# Patient Record
Sex: Female | Born: 1945 | Race: White | Hispanic: No | Marital: Married | Smoking: Never smoker
Health system: Southern US, Community
[De-identification: ages and names within clinical notes are randomized; demographics above are authoritative.]

## PROBLEM LIST (undated history)

## (undated) DIAGNOSIS — G43909 Migraine, unspecified, not intractable, without status migrainosus: Secondary | ICD-10-CM

## (undated) DIAGNOSIS — D649 Anemia, unspecified: Secondary | ICD-10-CM

## (undated) DIAGNOSIS — N6019 Diffuse cystic mastopathy of unspecified breast: Secondary | ICD-10-CM

## (undated) DIAGNOSIS — F32A Depression, unspecified: Secondary | ICD-10-CM

## (undated) DIAGNOSIS — C210 Malignant neoplasm of anus, unspecified: Secondary | ICD-10-CM

## (undated) DIAGNOSIS — K649 Unspecified hemorrhoids: Secondary | ICD-10-CM

## (undated) DIAGNOSIS — M5430 Sciatica, unspecified side: Secondary | ICD-10-CM

## (undated) DIAGNOSIS — R82998 Other abnormal findings in urine: Secondary | ICD-10-CM

## (undated) DIAGNOSIS — R682 Dry mouth, unspecified: Secondary | ICD-10-CM

## (undated) DIAGNOSIS — E039 Hypothyroidism, unspecified: Secondary | ICD-10-CM

## (undated) DIAGNOSIS — Z923 Personal history of irradiation: Secondary | ICD-10-CM

## (undated) DIAGNOSIS — Z9221 Personal history of antineoplastic chemotherapy: Secondary | ICD-10-CM

## (undated) DIAGNOSIS — D219 Benign neoplasm of connective and other soft tissue, unspecified: Secondary | ICD-10-CM

## (undated) DIAGNOSIS — M797 Fibromyalgia: Secondary | ICD-10-CM

## (undated) DIAGNOSIS — N762 Acute vulvitis: Secondary | ICD-10-CM

## (undated) DIAGNOSIS — D126 Benign neoplasm of colon, unspecified: Secondary | ICD-10-CM

## (undated) DIAGNOSIS — F329 Major depressive disorder, single episode, unspecified: Secondary | ICD-10-CM

## (undated) HISTORY — DX: Benign neoplasm of connective and other soft tissue, unspecified: D21.9

## (undated) HISTORY — DX: Unspecified hemorrhoids: K64.9

## (undated) HISTORY — DX: Diffuse cystic mastopathy of unspecified breast: N60.19

## (undated) HISTORY — DX: Anemia, unspecified: D64.9

## (undated) HISTORY — DX: Malignant neoplasm of anus, unspecified: C21.0

## (undated) HISTORY — DX: Sciatica, unspecified side: M54.30

## (undated) HISTORY — PX: COLONOSCOPY: SHX174

## (undated) HISTORY — DX: Dry mouth, unspecified: R68.2

## (undated) HISTORY — PX: COSMETIC SURGERY: SHX468

## (undated) HISTORY — DX: Benign neoplasm of colon, unspecified: D12.6

## (undated) HISTORY — DX: Migraine, unspecified, not intractable, without status migrainosus: G43.909

## (undated) HISTORY — PX: BREAST BIOPSY: SHX20

## (undated) HISTORY — DX: Fibromyalgia: M79.7

## (undated) HISTORY — DX: Acute vulvitis: N76.2

## (undated) HISTORY — PX: CATARACT EXTRACTION: SUR2

## (undated) HISTORY — DX: Other abnormal findings in urine: R82.998

---

## 1984-12-07 HISTORY — PX: VAGINAL HYSTERECTOMY: SUR661

## 1996-12-07 HISTORY — PX: REDUCTION MAMMAPLASTY: SUR839

## 2003-01-12 DIAGNOSIS — C210 Malignant neoplasm of anus, unspecified: Secondary | ICD-10-CM

## 2003-01-12 HISTORY — PX: OTHER SURGICAL HISTORY: SHX169

## 2003-01-12 HISTORY — DX: Malignant neoplasm of anus, unspecified: C21.0

## 2003-12-08 HISTORY — PX: BREAST SURGERY: SHX581

## 2003-12-08 HISTORY — PX: TONSILLECTOMY: SUR1361

## 2004-09-06 ENCOUNTER — Ambulatory Visit: Payer: Self-pay | Admitting: Oncology

## 2004-11-21 ENCOUNTER — Ambulatory Visit: Payer: Self-pay | Admitting: Oncology

## 2004-11-25 ENCOUNTER — Ambulatory Visit: Payer: Self-pay | Admitting: Oncology

## 2004-12-07 ENCOUNTER — Ambulatory Visit: Payer: Self-pay | Admitting: Oncology

## 2004-12-07 DIAGNOSIS — G43909 Migraine, unspecified, not intractable, without status migrainosus: Secondary | ICD-10-CM

## 2004-12-07 DIAGNOSIS — K649 Unspecified hemorrhoids: Secondary | ICD-10-CM

## 2004-12-07 DIAGNOSIS — M797 Fibromyalgia: Secondary | ICD-10-CM

## 2004-12-07 HISTORY — DX: Fibromyalgia: M79.7

## 2004-12-07 HISTORY — DX: Migraine, unspecified, not intractable, without status migrainosus: G43.909

## 2004-12-07 HISTORY — DX: Unspecified hemorrhoids: K64.9

## 2005-03-24 ENCOUNTER — Ambulatory Visit: Payer: Self-pay | Admitting: Oncology

## 2005-04-06 ENCOUNTER — Ambulatory Visit: Payer: Self-pay | Admitting: Oncology

## 2005-07-27 ENCOUNTER — Ambulatory Visit: Payer: Self-pay | Admitting: Oncology

## 2005-08-07 ENCOUNTER — Ambulatory Visit: Payer: Self-pay | Admitting: Oncology

## 2005-09-06 ENCOUNTER — Ambulatory Visit: Payer: Self-pay | Admitting: Oncology

## 2005-10-01 ENCOUNTER — Ambulatory Visit: Payer: Self-pay | Admitting: General Surgery

## 2005-10-07 ENCOUNTER — Ambulatory Visit: Payer: Self-pay | Admitting: Oncology

## 2005-12-15 ENCOUNTER — Ambulatory Visit: Payer: Self-pay | Admitting: Oncology

## 2005-12-22 ENCOUNTER — Ambulatory Visit: Payer: Self-pay | Admitting: General Surgery

## 2006-01-07 ENCOUNTER — Ambulatory Visit: Payer: Self-pay | Admitting: Oncology

## 2006-02-16 ENCOUNTER — Ambulatory Visit: Payer: Self-pay | Admitting: Rheumatology

## 2006-04-14 ENCOUNTER — Ambulatory Visit: Payer: Self-pay | Admitting: Oncology

## 2006-05-07 ENCOUNTER — Ambulatory Visit: Payer: Self-pay | Admitting: Oncology

## 2006-06-14 ENCOUNTER — Ambulatory Visit: Payer: Self-pay | Admitting: Oncology

## 2006-07-07 ENCOUNTER — Ambulatory Visit: Payer: Self-pay | Admitting: Oncology

## 2006-09-30 ENCOUNTER — Ambulatory Visit: Payer: Self-pay | Admitting: Oncology

## 2006-10-07 ENCOUNTER — Ambulatory Visit: Payer: Self-pay | Admitting: Oncology

## 2006-10-07 ENCOUNTER — Ambulatory Visit: Payer: Self-pay | Admitting: General Surgery

## 2006-11-18 ENCOUNTER — Ambulatory Visit: Payer: Self-pay | Admitting: Rheumatology

## 2007-02-16 ENCOUNTER — Ambulatory Visit: Payer: Self-pay | Admitting: Oncology

## 2007-03-08 ENCOUNTER — Ambulatory Visit: Payer: Self-pay | Admitting: Oncology

## 2007-08-02 ENCOUNTER — Other Ambulatory Visit: Admission: RE | Admit: 2007-08-02 | Discharge: 2007-08-02 | Payer: Self-pay | Admitting: Obstetrics and Gynecology

## 2007-09-19 ENCOUNTER — Ambulatory Visit: Payer: Self-pay | Admitting: General Surgery

## 2007-10-08 ENCOUNTER — Ambulatory Visit: Payer: Self-pay | Admitting: Oncology

## 2007-10-11 ENCOUNTER — Ambulatory Visit: Payer: Self-pay | Admitting: General Surgery

## 2007-10-17 ENCOUNTER — Ambulatory Visit: Payer: Self-pay | Admitting: Oncology

## 2007-11-07 ENCOUNTER — Ambulatory Visit: Payer: Self-pay | Admitting: Oncology

## 2007-11-22 ENCOUNTER — Encounter (INDEPENDENT_AMBULATORY_CARE_PROVIDER_SITE_OTHER): Payer: Self-pay | Admitting: Family Medicine

## 2007-11-22 ENCOUNTER — Ambulatory Visit: Payer: Self-pay | Admitting: Sports Medicine

## 2007-11-22 DIAGNOSIS — M722 Plantar fascial fibromatosis: Secondary | ICD-10-CM | POA: Insufficient documentation

## 2007-11-22 DIAGNOSIS — Z85038 Personal history of other malignant neoplasm of large intestine: Secondary | ICD-10-CM | POA: Insufficient documentation

## 2007-11-22 DIAGNOSIS — M1990 Unspecified osteoarthritis, unspecified site: Secondary | ICD-10-CM

## 2007-11-22 DIAGNOSIS — E785 Hyperlipidemia, unspecified: Secondary | ICD-10-CM | POA: Insufficient documentation

## 2007-12-20 ENCOUNTER — Ambulatory Visit: Payer: Self-pay | Admitting: Sports Medicine

## 2008-05-07 ENCOUNTER — Ambulatory Visit: Payer: Self-pay | Admitting: Oncology

## 2008-06-06 ENCOUNTER — Ambulatory Visit: Payer: Self-pay | Admitting: Oncology

## 2008-06-14 ENCOUNTER — Ambulatory Visit: Payer: Self-pay | Admitting: Oncology

## 2008-07-02 LAB — HM PAP SMEAR

## 2008-07-07 ENCOUNTER — Ambulatory Visit: Payer: Self-pay | Admitting: Oncology

## 2008-11-07 ENCOUNTER — Ambulatory Visit: Payer: Self-pay | Admitting: General Surgery

## 2009-01-07 ENCOUNTER — Ambulatory Visit: Payer: Self-pay | Admitting: Oncology

## 2009-01-09 ENCOUNTER — Ambulatory Visit: Payer: Self-pay | Admitting: Oncology

## 2009-02-04 ENCOUNTER — Ambulatory Visit: Payer: Self-pay | Admitting: Oncology

## 2009-03-07 ENCOUNTER — Ambulatory Visit: Payer: Self-pay | Admitting: Oncology

## 2009-03-11 ENCOUNTER — Ambulatory Visit: Payer: Self-pay | Admitting: Oncology

## 2009-04-06 ENCOUNTER — Ambulatory Visit: Payer: Self-pay | Admitting: Oncology

## 2009-06-06 ENCOUNTER — Ambulatory Visit: Payer: Self-pay | Admitting: Oncology

## 2009-06-17 ENCOUNTER — Ambulatory Visit: Payer: Self-pay | Admitting: Oncology

## 2009-07-07 ENCOUNTER — Ambulatory Visit: Payer: Self-pay | Admitting: Oncology

## 2009-10-01 ENCOUNTER — Ambulatory Visit: Payer: Self-pay | Admitting: Sports Medicine

## 2009-10-01 DIAGNOSIS — S93409A Sprain of unspecified ligament of unspecified ankle, initial encounter: Secondary | ICD-10-CM | POA: Insufficient documentation

## 2009-10-01 DIAGNOSIS — M25579 Pain in unspecified ankle and joints of unspecified foot: Secondary | ICD-10-CM

## 2009-10-01 DIAGNOSIS — M216X9 Other acquired deformities of unspecified foot: Secondary | ICD-10-CM

## 2009-11-05 ENCOUNTER — Ambulatory Visit: Payer: Self-pay

## 2009-11-13 ENCOUNTER — Ambulatory Visit: Payer: Self-pay

## 2010-06-06 ENCOUNTER — Ambulatory Visit: Payer: Self-pay | Admitting: Oncology

## 2010-06-12 ENCOUNTER — Ambulatory Visit: Payer: Self-pay | Admitting: Oncology

## 2010-07-07 ENCOUNTER — Ambulatory Visit: Payer: Self-pay | Admitting: Oncology

## 2010-10-20 ENCOUNTER — Ambulatory Visit: Payer: Self-pay | Admitting: General Surgery

## 2011-06-15 ENCOUNTER — Ambulatory Visit: Payer: Self-pay | Admitting: Oncology

## 2011-07-08 ENCOUNTER — Ambulatory Visit: Payer: Self-pay | Admitting: Oncology

## 2011-11-25 ENCOUNTER — Ambulatory Visit: Payer: Self-pay | Admitting: General Surgery

## 2011-12-18 ENCOUNTER — Ambulatory Visit: Payer: Self-pay | Admitting: General Surgery

## 2011-12-21 LAB — PATHOLOGY REPORT

## 2012-06-14 ENCOUNTER — Ambulatory Visit: Payer: Self-pay | Admitting: Oncology

## 2012-06-14 LAB — COMPREHENSIVE METABOLIC PANEL
Albumin: 3.5 g/dL (ref 3.4–5.0)
BUN: 18 mg/dL (ref 7–18)
Bilirubin,Total: 0.3 mg/dL (ref 0.2–1.0)
Chloride: 105 mmol/L (ref 98–107)
Creatinine: 0.84 mg/dL (ref 0.60–1.30)
Glucose: 111 mg/dL — ABNORMAL HIGH (ref 65–99)
Osmolality: 280 (ref 275–301)
Potassium: 3.9 mmol/L (ref 3.5–5.1)
SGOT(AST): 21 U/L (ref 15–37)
Sodium: 139 mmol/L (ref 136–145)
Total Protein: 6.7 g/dL (ref 6.4–8.2)

## 2012-06-14 LAB — CBC CANCER CENTER
Eosinophil #: 0.1 x10 3/mm (ref 0.0–0.7)
HCT: 37.4 % (ref 35.0–47.0)
Lymphocyte %: 27 %
MCHC: 33.4 g/dL (ref 32.0–36.0)
Monocyte %: 9.7 %
Neutrophil #: 2.4 x10 3/mm (ref 1.4–6.5)
Neutrophil %: 59.4 %
RDW: 13.2 % (ref 11.5–14.5)

## 2012-07-07 ENCOUNTER — Ambulatory Visit: Payer: Self-pay | Admitting: Oncology

## 2012-10-07 LAB — HM COLONOSCOPY: HM Colonoscopy: 1

## 2012-12-12 ENCOUNTER — Ambulatory Visit: Payer: Self-pay | Admitting: General Surgery

## 2012-12-30 DIAGNOSIS — N6019 Diffuse cystic mastopathy of unspecified breast: Secondary | ICD-10-CM

## 2012-12-30 HISTORY — DX: Diffuse cystic mastopathy of unspecified breast: N60.19

## 2013-02-06 ENCOUNTER — Ambulatory Visit: Payer: Self-pay | Admitting: General Surgery

## 2013-02-10 ENCOUNTER — Ambulatory Visit: Payer: Self-pay | Admitting: General Surgery

## 2013-02-13 LAB — PATHOLOGY REPORT

## 2013-02-20 DIAGNOSIS — R82998 Other abnormal findings in urine: Secondary | ICD-10-CM

## 2013-02-20 HISTORY — DX: Other abnormal findings in urine: R82.998

## 2013-03-23 ENCOUNTER — Encounter: Payer: Self-pay | Admitting: *Deleted

## 2013-03-23 DIAGNOSIS — G43909 Migraine, unspecified, not intractable, without status migrainosus: Secondary | ICD-10-CM | POA: Insufficient documentation

## 2013-03-23 DIAGNOSIS — M797 Fibromyalgia: Secondary | ICD-10-CM | POA: Insufficient documentation

## 2013-03-23 DIAGNOSIS — R82998 Other abnormal findings in urine: Secondary | ICD-10-CM | POA: Insufficient documentation

## 2013-03-23 DIAGNOSIS — N6019 Diffuse cystic mastopathy of unspecified breast: Secondary | ICD-10-CM | POA: Insufficient documentation

## 2013-03-28 ENCOUNTER — Ambulatory Visit (INDEPENDENT_AMBULATORY_CARE_PROVIDER_SITE_OTHER): Payer: Medicare Other | Admitting: General Surgery

## 2013-03-28 ENCOUNTER — Encounter: Payer: Self-pay | Admitting: General Surgery

## 2013-03-28 VITALS — BP 124/90 | HR 62 | Resp 12 | Ht 64.0 in | Wt 183.0 lb

## 2013-03-28 DIAGNOSIS — Z85048 Personal history of other malignant neoplasm of rectum, rectosigmoid junction, and anus: Secondary | ICD-10-CM

## 2013-03-28 DIAGNOSIS — Z86004 Personal history of in-situ neoplasm of other and unspecified digestive organs: Secondary | ICD-10-CM

## 2013-03-28 NOTE — Progress Notes (Signed)
Patient ID: Melissa [name], female   DOB: [birth year], 67 y.o.   MRN: [id number]  Chief Complaint  Patient presents with  . Routine Post Op    EXCISION PERANAL    HPI GENNETT Melissa is a 67 y.o. female here today for an 6 weeks post op  excision peranal ulcer .Only bleed with bowel movement. 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 HPI  Past Medical History  Diagnosis Date  . Fibromyalgia 2006  . Hemorrhoids 2006  . Diffuse cystic mastopathy 12/30/12  . Migraines 2006  . Cancer 01/16/2004    anorectum  . Carcinoma in situ of anal canal [id number]  . Other nonspecific finding on examination of urine [id number]    Past Surgical History  Procedure Laterality Date  . Abdominal hysterectomy  1986  . Colonoscopy  2008, 2013  . Tonsillectomy  2005  . Breast surgery  2005    BREAST REDUCTION SX  . Anal mass exc  2014  . Cosmetic surgery  20008    FACIAL    History reviewed. No pertinent family history.  Social History History  Substance Use Topics  . Smoking status: Never Smoker   . Smokeless tobacco: Never Used  . Alcohol Use: No    Allergies  Allergen Reactions  . Pentazocine Lactate   . Propoxyphene Hcl   . Propoxyphene-Acetaminophen   . Tramadol Hcl   . Ciprofloxacin Hcl Rash  . Dicloxacillin Sodium Rash  . Iodine Rash    Current Outpatient Prescriptions  Medication Sig Dispense Refill  . diphenhydrAMINE (BENADRYL) 25 MG tablet Take 25 mg by mouth every 6 (six) hours as needed for itching.      . levothyroxine (SYNTHROID, LEVOTHROID) 125 MCG tablet Take 125 mcg by mouth daily before breakfast.      . loperamide (IMODIUM) 2 MG capsule Take 2 mg by mouth 4 (four) times daily as needed for diarrhea or loose stools.      . mirtazapine (REMERON) 30 MG tablet Take 30 mg by mouth at bedtime.      . nabumetone (RELAFEN) 500 MG tablet Take 500 mg by mouth daily.      . sertraline (ZOLOFT) 100 MG tablet Take 100 mg by mouth daily.      Marland Kitchen  venlafaxine (EFFEXOR) 75 MG tablet Take 75 mg by mouth 2 (two) times daily.       No current facility-administered medications for this visit.    Review of Systems Review of Systems  Constitutional: Negative.   Respiratory: Negative.   Cardiovascular: Negative.     Blood pressure 124/90, pulse 62, resp. rate 12, height 5\' 4"  (1.626 m), weight 183 lb (83.008 kg).  Physical Exam Physical Exam  Genitourinary:       Data Reviewed none  Assessment    Benign perianal ulcer, healing sow but is making progress.     Plan    Recheck in 6 weeks        SANKAR,SEEPLAPUTHUR G 03/28/2013, 6:35 PM

## 2013-03-28 NOTE — Patient Instructions (Addendum)
Return in six weeks

## 2013-05-09 ENCOUNTER — Encounter: Payer: Self-pay | Admitting: General Surgery

## 2013-05-09 ENCOUNTER — Ambulatory Visit (INDEPENDENT_AMBULATORY_CARE_PROVIDER_SITE_OTHER): Payer: Medicare Other | Admitting: General Surgery

## 2013-05-09 VITALS — BP 152/80 | HR 80 | Resp 12 | Ht 63.0 in | Wt 181.0 lb

## 2013-05-09 DIAGNOSIS — Z85048 Personal history of other malignant neoplasm of rectum, rectosigmoid junction, and anus: Secondary | ICD-10-CM

## 2013-05-09 DIAGNOSIS — Z86004 Personal history of in-situ neoplasm of other and unspecified digestive organs: Secondary | ICD-10-CM

## 2013-05-09 NOTE — Patient Instructions (Addendum)
Call for any worsening abdominal pain or rectal symptoms. Follow up as scheduled for Mammogram.

## 2013-05-09 NOTE — Progress Notes (Signed)
Patient ID: Melissa [name], female   DOB: [birth year], 67 y.o.   MRN: [id number]  Chief Complaint  Patient presents with  . Follow-up    HPI Melissa [name] is a 67 y.o. female who presents for a 6 week follow up s/p EAU and excision of perianal ulcer. The procedure was performed on 02/10/13. The patient has a history of anal SCC in-situ. She is doing well but did have some rectal bleeding in the last month. She complains of a 1 month history of mild right and left lower abdominal quadrant pain.  HPI  Past Medical History  Diagnosis Date  . Fibromyalgia 2006  . Hemorrhoids 2006  . Diffuse cystic mastopathy 12/30/12  . Migraines 2006  . Cancer 01/16/2004    anorectum  . Carcinoma in situ of anal canal [id number]  . Other nonspecific finding on examination of urine [id number]    Past Surgical History  Procedure Laterality Date  . Abdominal hysterectomy  1986  . Colonoscopy  2008, 2013  . Tonsillectomy  2005  . Breast surgery  2005    BREAST REDUCTION SX  . Anal mass exc  2014  . Cosmetic surgery  20008    FACIAL    History reviewed. No pertinent family history.  Social History History  Substance Use Topics  . Smoking status: Never Smoker   . Smokeless tobacco: Never Used  . Alcohol Use: No    Allergies  Allergen Reactions  . Pentazocine Lactate   . Propoxyphene Hcl   . Propoxyphene-Acetaminophen   . Tramadol Hcl   . Ciprofloxacin Hcl Rash  . Dicloxacillin Sodium Rash  . Iodine Rash    Current Outpatient Prescriptions  Medication Sig Dispense Refill  . diphenhydrAMINE (BENADRYL) 25 MG tablet Take 25 mg by mouth every 6 (six) hours as needed for itching.      . levothyroxine (SYNTHROID, LEVOTHROID) 125 MCG tablet Take 125 mcg by mouth daily before breakfast.      . loperamide (IMODIUM) 2 MG capsule Take 2 mg by mouth 4 (four) times daily as needed for diarrhea or loose stools.      . nabumetone (RELAFEN) 500 MG tablet Take 500 mg by mouth daily.      . sertraline  (ZOLOFT) 100 MG tablet Take 100 mg by mouth 2 (two) times daily.        No current facility-administered medications for this visit.    Review of Systems Review of Systems  Constitutional: Negative.   Cardiovascular: Negative.   Gastrointestinal: Positive for anal bleeding.    Blood pressure 152/80, pulse 80, resp. rate 12, height 5\' 3"  (1.6 m), weight 181 lb (82.101 kg).  Physical Exam Physical Exam  Constitutional: She appears well-developed and well-nourished.  Eyes: Conjunctivae are normal. No scleral icterus.  Neck: Trachea normal. No mass and no thyromegaly present.  Abdominal: Soft. Normal appearance and bowel sounds are normal. There is no hepatosplenomegaly. There is tenderness in the right lower quadrant and left lower quadrant. No hernia.  Genitourinary:       Data Reviewed None  Assessment    Healed perianal ulcer. New complaint of mild abdominal discomfort. No findings on exam.    Plan    Call for any worsening symptoms abdominal or rectal. Routine follow up January 2015.       Makaria Poarch G 05/09/2013, 11:03 AM

## 2013-06-06 ENCOUNTER — Ambulatory Visit: Payer: Self-pay | Admitting: Oncology

## 2013-06-28 LAB — CBC CANCER CENTER
Basophil #: 0 x10 3/mm (ref 0.0–0.1)
Lymphocyte %: 35.6 %
MCH: 33.1 pg (ref 26.0–34.0)
MCHC: 34.5 g/dL (ref 32.0–36.0)
MCV: 96 fL (ref 80–100)
Monocyte #: 0.4 x10 3/mm (ref 0.2–0.9)
Monocyte %: 9.7 %
Neutrophil #: 2.3 x10 3/mm (ref 1.4–6.5)
Platelet: 235 x10 3/mm (ref 150–440)
RBC: 4.04 10*6/uL (ref 3.80–5.20)
RDW: 13.6 % (ref 11.5–14.5)

## 2013-06-28 LAB — COMPREHENSIVE METABOLIC PANEL
Albumin: 3.9 g/dL (ref 3.4–5.0)
Alkaline Phosphatase: 119 U/L (ref 50–136)
BUN: 16 mg/dL (ref 7–18)
Calcium, Total: 9.1 mg/dL (ref 8.5–10.1)
Creatinine: 1 mg/dL (ref 0.60–1.30)
EGFR (African American): >60
EGFR (Non-African Amer.): 58 — ABNORMAL LOW
Glucose: 123 mg/dL — ABNORMAL HIGH (ref 65–99)
Osmolality: 280 (ref 275–301)
Potassium: 4.8 mmol/L (ref 3.5–5.1)
SGPT (ALT): 34 U/L (ref 12–78)
Sodium: 139 mmol/L (ref 136–145)

## 2013-07-07 ENCOUNTER — Ambulatory Visit: Payer: Self-pay | Admitting: Oncology

## 2013-07-12 ENCOUNTER — Other Ambulatory Visit: Payer: Self-pay

## 2013-08-02 ENCOUNTER — Ambulatory Visit: Payer: Self-pay | Admitting: Family Medicine

## 2013-08-02 LAB — HM DEXA SCAN

## 2013-10-12 ENCOUNTER — Other Ambulatory Visit: Payer: Self-pay

## 2013-10-31 LAB — BASIC METABOLIC PANEL
BUN: 10 mg/dL (ref 4–21)
CREATININE: 0.8 mg/dL (ref 0.5–1.1)
GLUCOSE: 97 mg/dL
POTASSIUM: 4.6 mmol/L (ref 3.4–5.3)
SODIUM: 141 mmol/L (ref 137–147)

## 2013-10-31 LAB — LIPID PANEL
Cholesterol: 244 mg/dL — AB (ref 0–200)
HDL: 51 mg/dL (ref 35–70)
LDL Cholesterol: 129 mg/dL
LDl/HDL Ratio: 2.5
Triglycerides: 319 mg/dL — AB (ref 40–160)

## 2013-10-31 LAB — CBC AND DIFFERENTIAL
HCT: 38 % (ref 36–46)
HEMOGLOBIN: 12.6 g/dL (ref 12.0–16.0)
Neutrophils Absolute: 2 /uL
Platelets: 221 10*3/uL (ref 150–399)
WBC: 4 10^3/mL

## 2013-10-31 LAB — TSH: TSH: 8.94 u[IU]/mL — AB (ref 0.41–5.90)

## 2013-10-31 LAB — HEPATIC FUNCTION PANEL
ALT: 19 U/L (ref 7–35)
AST: 16 U/L (ref 13–35)
Alkaline Phosphatase: 92 U/L (ref 25–125)
BILIRUBIN, TOTAL: 0.2 mg/dL

## 2013-11-14 ENCOUNTER — Encounter: Payer: Self-pay | Admitting: Family Medicine

## 2013-12-07 ENCOUNTER — Encounter: Payer: Self-pay | Admitting: Family Medicine

## 2013-12-13 ENCOUNTER — Ambulatory Visit: Payer: Self-pay | Admitting: General Surgery

## 2013-12-14 ENCOUNTER — Encounter: Payer: Self-pay | Admitting: General Surgery

## 2013-12-20 ENCOUNTER — Ambulatory Visit: Payer: Medicare Other | Admitting: General Surgery

## 2014-01-04 ENCOUNTER — Ambulatory Visit (INDEPENDENT_AMBULATORY_CARE_PROVIDER_SITE_OTHER): Payer: Medicare HMO | Admitting: General Surgery

## 2014-01-04 ENCOUNTER — Encounter: Payer: Self-pay | Admitting: General Surgery

## 2014-01-04 VITALS — BP 140/80 | HR 74 | Resp 14 | Ht 62.0 in | Wt 177.0 lb

## 2014-01-04 DIAGNOSIS — Z1239 Encounter for other screening for malignant neoplasm of breast: Secondary | ICD-10-CM

## 2014-01-04 DIAGNOSIS — Z86004 Personal history of in-situ neoplasm of other and unspecified digestive organs: Secondary | ICD-10-CM

## 2014-01-04 DIAGNOSIS — Z87898 Personal history of other specified conditions: Secondary | ICD-10-CM

## 2014-01-04 NOTE — Patient Instructions (Signed)
Patient to return in one year bilateral screening mammogram. 

## 2014-01-04 NOTE — Progress Notes (Signed)
Patient ID: Melissa [name], female   DOB: [birth year], 68 y.o.   MRN: [id number]  Chief Complaint  Patient presents with  . Follow-up    mammogram    HPI Melissa [name] is a 68 y.o. female who presents for a breast evaluation. The most recent mammogram was done on 12/13/13. Patient does perform regular self breast checks and gets regular mammograms done.  She also has history of anal CA in situ, treated with radiation. Problems of proctitis in past. She thinks she may have a tear in rectal area after hard BM recently.  HPI  Past Medical History  Diagnosis Date  . Fibromyalgia 2006  . Hemorrhoids 2006  . Diffuse cystic mastopathy 12/30/12  . Migraines 2006  . Cancer 01/16/2004    anorectum  . Carcinoma in situ of anal canal [id number]  . Other nonspecific finding on examination of urine [id number]    Past Surgical History  Procedure Laterality Date  . Abdominal hysterectomy  1986  . Colonoscopy  2008, 2013  . Tonsillectomy  2005  . Breast surgery  2005    BREAST REDUCTION SX  . Anal mass exc  2014  . Cosmetic surgery  20008    FACIAL    History reviewed. No pertinent family history.  Social History History  Substance Use Topics  . Smoking status: Never Smoker   . Smokeless tobacco: Never Used  . Alcohol Use: No    Allergies  Allergen Reactions  . Pentazocine Lactate   . Propoxyphene Hcl   . Propoxyphene N-Acetaminophen   . Tramadol Hcl   . Ciprofloxacin Hcl Rash  . Dicloxacillin Sodium Rash  . Iodine Rash    Current Outpatient Prescriptions  Medication Sig Dispense Refill  . diphenhydrAMINE (BENADRYL) 25 MG tablet Take 25 mg by mouth every 6 (six) hours as needed for itching.      . levothyroxine (SYNTHROID, LEVOTHROID) 125 MCG tablet Take 125 mcg by mouth daily before breakfast.      . loperamide (IMODIUM) 2 MG capsule Take 2 mg by mouth 4 (four) times daily as needed for diarrhea or loose stools.      . nabumetone (RELAFEN) 500 MG tablet Take  500 mg by mouth daily.      . sertraline (ZOLOFT) 100 MG tablet Take 100 mg by mouth 2 (two) times daily.        No current facility-administered medications for this visit.    Review of Systems Review of Systems  Constitutional: Negative.   Respiratory: Negative.   Cardiovascular: Negative.     Blood pressure 140/80, pulse 74, resp. rate 14, height 5\' 2"  (1.575 m), weight 177 lb (80.287 kg).  Physical Exam Physical Exam  Constitutional: She is oriented to person, place, and time. She appears well-developed and well-nourished.  Eyes: Conjunctivae are normal.  Neck: Neck supple. No mass and no thyromegaly present.  Cardiovascular: Normal rate, regular rhythm and normal heart sounds.   Pulmonary/Chest: Breath sounds normal. Right breast exhibits no inverted nipple, no mass, no nipple discharge, no skin change and no tenderness. Left breast exhibits no inverted nipple, no mass, no nipple discharge, no skin change and no tenderness.  Abdominal: Soft. Normal appearance and bowel sounds are normal. There is no hepatosplenomegaly. There is no tenderness. No hernia.  Genitourinary: Rectal exam shows tenderness (not new). Rectal exam shows no external hemorrhoid, no internal hemorrhoid, no fissure and anal tone normal.  Pt had an ulcer excised at 4-5 ocl some time ago. This area  is healed. No new findings. No fissure noted.  Lymphadenopathy:    She has no cervical adenopathy.    She has no axillary adenopathy.  Neurological: She is alert and oriented to person, place, and time.  Skin: Skin is warm and dry.    Data Reviewed Mammogram reviewed Assessment    Stable exam     Plan    Patient to return in one year bilateral screening mammogram.         SANKAR,SEEPLAPUTHUR G 01/04/2014, 2:20 PM

## 2014-01-09 ENCOUNTER — Telehealth: Payer: Self-pay | Admitting: *Deleted

## 2014-01-09 NOTE — Telephone Encounter (Signed)
Pt wants to know if she can get another activation code for my chart, she was seen here last week and the paper that was handed to her didn't have a code on it. I wasn't sure what she needed to do. Thanks!

## 2014-01-09 NOTE — Telephone Encounter (Signed)
Patient is already my chart active which is why she does not have a code.  Please call the patient back and give her the information she needs to access my chart. Thanks.

## 2014-12-14 ENCOUNTER — Ambulatory Visit: Payer: Self-pay | Admitting: General Surgery

## 2014-12-17 ENCOUNTER — Encounter: Payer: Self-pay | Admitting: General Surgery

## 2014-12-19 ENCOUNTER — Encounter: Payer: Self-pay | Admitting: General Surgery

## 2014-12-19 ENCOUNTER — Ambulatory Visit (INDEPENDENT_AMBULATORY_CARE_PROVIDER_SITE_OTHER): Payer: Medicare HMO | Admitting: General Surgery

## 2014-12-19 VITALS — BP 130/70 | HR 84 | Resp 14 | Ht 62.0 in | Wt 177.0 lb

## 2014-12-19 DIAGNOSIS — N6011 Diffuse cystic mastopathy of right breast: Secondary | ICD-10-CM

## 2014-12-19 DIAGNOSIS — R1030 Lower abdominal pain, unspecified: Secondary | ICD-10-CM

## 2014-12-19 DIAGNOSIS — N6012 Diffuse cystic mastopathy of left breast: Secondary | ICD-10-CM

## 2014-12-19 DIAGNOSIS — Z86008 Personal history of in-situ neoplasm of other site: Secondary | ICD-10-CM

## 2014-12-19 DIAGNOSIS — Z86004 Personal history of in-situ neoplasm of other and unspecified digestive organs: Secondary | ICD-10-CM

## 2014-12-19 MED ORDER — PREDNISONE 50 MG PO TABS: ORAL_TABLET | ORAL | Status: DC

## 2014-12-19 NOTE — Progress Notes (Signed)
Patient ID: Melissa [name], female   DOB: 08/19/1946, 69 y.o.   MRN: [id number]  Chief Complaint  Patient presents with  . Follow-up    mammogram    HPI Melissa [name] is a 69 y.o. female who presents for a breast evaluation. The most recent mammogram was done on 12/14/14.  Patient does perform regular self breast checks and gets regular mammograms done.  Patient states she have been having some abdominal pain in her lower abdomen area. She states this have been going on for about four months now. The pain is sharpe and last for hours- has been intermiittent. Still with some blood per rectum, but no new issues   HPI  Past Medical History  Diagnosis Date  . Fibromyalgia 2006  . Hemorrhoids 2006  . Diffuse cystic mastopathy 12/30/12  . Migraines 2006  . Cancer 01/16/2004    anorectum  . Carcinoma in situ of anal canal [id number]  . Other nonspecific finding on examination of urine [id number]    Past Surgical History  Procedure Laterality Date  . Abdominal hysterectomy  1986  . Colonoscopy  2008, 2013  . Tonsillectomy  2005  . Breast surgery  2005    BREAST REDUCTION SX  . Anal mass exc  2014  . Cosmetic surgery  20008    FACIAL    No family history on file.  Social History History  Substance Use Topics  . Smoking status: Never Smoker   . Smokeless tobacco: Never Used  . Alcohol Use: No    Allergies  Allergen Reactions  . Pentazocine Lactate   . Propoxyphene Hcl   . Propoxyphene N-Acetaminophen   . Tramadol Hcl   . Ciprofloxacin Hcl Rash  . Dicloxacillin Sodium Rash  . Iodine Rash    Current Outpatient Prescriptions  Medication Sig Dispense Refill  . diphenhydrAMINE (BENADRYL) 25 MG tablet Take 25 mg by mouth every 6 (six) hours as needed for itching.    . levothyroxine (SYNTHROID, LEVOTHROID) 125 MCG tablet Take 125 mcg by mouth daily before breakfast.    . loperamide (IMODIUM) 2 MG capsule Take 2 mg by mouth 4 (four) times daily as needed for  diarrhea or loose stools.    . nabumetone (RELAFEN) 500 MG tablet Take 500 mg by mouth daily.    . sertraline (ZOLOFT) 100 MG tablet Take 100 mg by mouth 2 (two) times daily.      No current facility-administered medications for this visit.    Review of Systems Review of Systems  Constitutional: Negative.   Respiratory: Negative.   Cardiovascular: Negative.     Blood pressure 130/70, pulse 84, resp. rate 14, height _0  (1.575 m), weight 177 lb (80.287 kg).  Physical Exam Physical Exam  Constitutional: She is oriented to person, place, and time. She appears well-developed and well-nourished.  Eyes: Conjunctivae are normal. No scleral icterus.  Neck: Neck supple.  Cardiovascular: Normal rate, regular rhythm and normal heart sounds.   Pulmonary/Chest: Breath sounds normal.  Abdominal: Soft. Normal appearance and bowel sounds are normal. There is no hepatomegaly. There is tenderness in the right lower quadrant, suprapubic area and left lower quadrant. No hernia.  Lymphadenopathy:    She has no cervical adenopathy.  Neurological: She is alert and oriented to person, place, and time.  Skin: Skin is warm and dry.    Data Reviewed Prior colonoscopy-2013, showed mild diverticulosis.  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  New symptom of abd pain     Plan   Bil screening mammogram in 1 yr. Recommend CT abd/pelvis.     Patient has been scheduled for a CT abdomen/pelvis with contrast at [facility] for 12-25-14 at 8 am (arrive 7:45 am). Prep: no solids 4 hours prior but patient may have clear liquids up until exam time, pick up prep kit, and take medication list. This patient will also need to pre-medicate due to an iodine allergy. Patient verbalizes understanding.      Gaspar Cola 12/19/2014, 2:18 PM

## 2014-12-19 NOTE — Patient Instructions (Addendum)
Patient will be asked to return to the office in one year with a bilateral screening mammogram.  Patient has been scheduled for a CT abdomen/pelvis with contrast at [facility] for 12-25-14 at 8 am (arrive 7:45 am). Prep: no solids 4 hours prior but patient may have clear liquids up until exam time, pick up prep kit, and take medication list. This patient will need to pre-medicate due to an iodine allergy. Directions as follows: Take prednisone 50 mg 13 hours, 7 hours, and one hour prior to procedure. Also, take benadryl 50 mg one hour prior to procedure. Patient verbalizes understanding.

## 2014-12-25 ENCOUNTER — Ambulatory Visit: Payer: Self-pay | Admitting: General Surgery

## 2014-12-27 ENCOUNTER — Encounter: Payer: Self-pay | Admitting: General Surgery

## 2014-12-31 ENCOUNTER — Telehealth: Payer: Self-pay | Admitting: *Deleted

## 2014-12-31 NOTE — Telephone Encounter (Signed)
-----   Message from Christene Lye, MD sent at 12/31/2014  2:44 PM EST ----- CT shows some pericholecystic fluid and mild gbw thickening. Needs [place] to better assess this. CT otherwise unremarkable.. Pt advised by telephone.  Will schedule [place] of gallbladder and biliary system

## 2014-12-31 NOTE — Telephone Encounter (Signed)
Patient has been scheduled for an ultrasound of the gallbladder and liver at [facility] for Friday, 01-04-15 at 8:45 am (arrive 8:30 am). Prep: NPO after midnight. She is aware of all instructions and verbalizes understanding.

## 2015-01-04 ENCOUNTER — Encounter: Payer: Self-pay | Admitting: General Surgery

## 2015-01-04 ENCOUNTER — Ambulatory Visit: Payer: Self-pay | Admitting: General Surgery

## 2015-03-29 NOTE — Op Note (Signed)
PATIENT NAME:  Melissa [name], SCHIRALDI MR#:  [id number] DATE OF BIRTH:  [birth year]  DATE OF PROCEDURE:  02/10/2013  PREOPERATIVE DIAGNOSIS:  1.  Nonhealing ulcer, perianal skin.  2.  History of anal squamous cell carcinoma in situ, post radiation.  POSTOPERATIVE DIAGNOSIS:     1.  Nonhealing ulcer, perianal skin.  2.  History of anal squamous cell carcinoma in situ, post radiation.  OPERATION PERFORMED:  1.  Rectal exam under anesthesia. 2.  Excision of perianal skin ulcer.   SURGEON:  Mckinley Jewel, M.D.   ANESTHESIA: General.   COMPLICATIONS: None.   ESTIMATED BLOOD LOSS: Minimal.   DRAINS: None.   DESCRIPTION OF PROCEDURE: The patient was put to sleep in the supine position on the operating table and then placed in the lithotomy position. The anal area was prepped and draped out as a sterile field. Complete exam was performed. At this time, digital exam showed no palpable abnormality within the anorectal region. Speculum examination showed mucosa that was slightly reddish in color but had no ulcerations or other abnormalities noted. No active bleeding was noted.   Attention was directed to the perianal skin between the 3 to 6 o'clock position where there was an irregularly-shaped superficial ulcer. This has remained basically unchanged over a 6-week period of time and given the history of anal squamous cell carcinoma in situ, it was decided to excise this at this time.  A circumferential excision of this was then performed with the excised tissue measuring a maximum of 1.5 cm.  Following this, the subcutaneous tissue was inspected and cautery used to control bleeding. The deeper tissue was closed with 3-0 Monocryl and the skin with interrupted subcuticular 3-0 Monocryl. The area was coated with bacitracin ointment and dressed. The patient tolerated the procedure well with no immediate problems. She was subsequently returned to the recovery room in stable condition.      ____________________________ S.Robinette Haines, MD sgs:ct D: 02/11/2013 08:45:16 ET T: 02/11/2013 12:48:04 ET JOB#: [id number]  cc: S.G. Jamal Collin, MD, <Dictator> [facility] Robinette Haines MD ELECTRONICALLY SIGNED 02/12/2013 12:37

## 2015-04-03 ENCOUNTER — Encounter: Payer: Self-pay | Admitting: Emergency Medicine

## 2015-04-11 ENCOUNTER — Encounter: Payer: Self-pay | Admitting: Emergency Medicine

## 2015-04-11 DIAGNOSIS — K219 Gastro-esophageal reflux disease without esophagitis: Secondary | ICD-10-CM | POA: Insufficient documentation

## 2015-04-11 DIAGNOSIS — C20 Malignant neoplasm of rectum: Secondary | ICD-10-CM | POA: Insufficient documentation

## 2015-04-11 DIAGNOSIS — M797 Fibromyalgia: Secondary | ICD-10-CM | POA: Insufficient documentation

## 2015-04-11 DIAGNOSIS — N342 Other urethritis: Secondary | ICD-10-CM | POA: Insufficient documentation

## 2015-04-11 DIAGNOSIS — K529 Noninfective gastroenteritis and colitis, unspecified: Secondary | ICD-10-CM | POA: Insufficient documentation

## 2015-04-11 DIAGNOSIS — F329 Major depressive disorder, single episode, unspecified: Secondary | ICD-10-CM | POA: Insufficient documentation

## 2015-04-11 DIAGNOSIS — N6019 Diffuse cystic mastopathy of unspecified breast: Secondary | ICD-10-CM | POA: Insufficient documentation

## 2015-04-11 DIAGNOSIS — G4710 Hypersomnia, unspecified: Secondary | ICD-10-CM | POA: Insufficient documentation

## 2015-04-11 DIAGNOSIS — G4730 Sleep apnea, unspecified: Secondary | ICD-10-CM | POA: Insufficient documentation

## 2015-04-11 DIAGNOSIS — N896 Tight hymenal ring: Secondary | ICD-10-CM | POA: Insufficient documentation

## 2015-04-11 DIAGNOSIS — E039 Hypothyroidism, unspecified: Secondary | ICD-10-CM | POA: Insufficient documentation

## 2015-04-11 DIAGNOSIS — L259 Unspecified contact dermatitis, unspecified cause: Secondary | ICD-10-CM | POA: Insufficient documentation

## 2015-04-11 DIAGNOSIS — M5430 Sciatica, unspecified side: Secondary | ICD-10-CM | POA: Insufficient documentation

## 2015-04-11 DIAGNOSIS — B029 Zoster without complications: Secondary | ICD-10-CM | POA: Insufficient documentation

## 2015-04-11 DIAGNOSIS — N952 Postmenopausal atrophic vaginitis: Secondary | ICD-10-CM | POA: Insufficient documentation

## 2015-04-11 DIAGNOSIS — K648 Other hemorrhoids: Secondary | ICD-10-CM | POA: Insufficient documentation

## 2015-04-11 DIAGNOSIS — M791 Myalgia, unspecified site: Secondary | ICD-10-CM | POA: Insufficient documentation

## 2015-05-09 ENCOUNTER — Ambulatory Visit
Admission: RE | Admit: 2015-05-09 | Discharge: 2015-05-09 | Disposition: A | Discharge status: Home / Self Care | Payer: Commercial Managed Care - HMO | Source: Ambulatory Visit | Attending: Family Medicine | Admitting: Family Medicine

## 2015-05-09 ENCOUNTER — Ambulatory Visit (INDEPENDENT_AMBULATORY_CARE_PROVIDER_SITE_OTHER): Payer: Commercial Managed Care - HMO | Admitting: Family Medicine

## 2015-05-09 ENCOUNTER — Encounter: Payer: Self-pay | Admitting: Family Medicine

## 2015-05-09 VITALS — BP 126/62 | HR 66 | Temp 97.6°F | Resp 16 | Wt 175.0 lb

## 2015-05-09 DIAGNOSIS — J40 Bronchitis, not specified as acute or chronic: Secondary | ICD-10-CM

## 2015-05-09 DIAGNOSIS — E785 Hyperlipidemia, unspecified: Secondary | ICD-10-CM

## 2015-05-09 DIAGNOSIS — R05 Cough: Secondary | ICD-10-CM | POA: Insufficient documentation

## 2015-05-09 DIAGNOSIS — E039 Hypothyroidism, unspecified: Secondary | ICD-10-CM | POA: Diagnosis not present

## 2015-05-09 DIAGNOSIS — M797 Fibromyalgia: Secondary | ICD-10-CM | POA: Diagnosis not present

## 2015-05-09 DIAGNOSIS — R059 Cough, unspecified: Secondary | ICD-10-CM

## 2015-05-09 MED ORDER — DOXYCYCLINE HYCLATE 100 MG PO TABS: 100.0000 mg | ORAL_TABLET | Freq: Two times a day (BID) | ORAL | Status: DC

## 2015-05-09 NOTE — Progress Notes (Signed)
Subjective:     Patient ID: Melissa [name], female   DOB: 1946/07/02, 69 y.o.   MRN: [id number]  Dizziness This is a new problem. The current episode started today. The problem occurs constantly. The problem has been unchanged. Associated symptoms include coughing and headaches (sinus pain and pressure). The symptoms are aggravated by bending and standing. She has tried nothing for the symptoms. The treatment provided no relief.  Pt reports that she feels like it is sinus related and she is having sinus pain and pressure. She has had a cough recently, that she had had recurrently for years that seems to be allergy related.   Review of Systems  Constitutional: Negative.   HENT: Positive for sinus pressure.   Respiratory: Positive for cough.   Neurological: Positive for dizziness and headaches (sinus pain and pressure).    Follow up   The patient was last seen for this 6 months ago.  Changes made at last visit include None.   She reports good compliance with treatment. She is not having side effects. Pt is here to f/u on her hypothyroidism and her fibromyalgia. She feels stable on her chronic problems other than the dizziness she is having as explained above. Filed Vitals:   05/09/15 1333  BP: 126/62  Pulse: 66  Temp: 97.6 F (36.4 C)  Resp: 16          Objective:   Physical Exam  Constitutional: She is oriented to person, place, and time. She appears well-developed and well-nourished.  HENT:  Head: Normocephalic and atraumatic.  Eyes: Conjunctivae and EOM are normal. Pupils are equal, round, and reactive to light.  Neck: Normal range of motion. Neck supple.  Cardiovascular: Normal rate, regular rhythm and normal heart sounds.   Pulmonary/Chest: Effort normal and breath sounds normal.  Abdominal: Soft. Bowel sounds are normal.  Neurological: She is alert and oriented to person, place, and time.  Skin: Skin is warm and dry.  Psychiatric: She has a normal mood and  affect. Her behavior is normal. Judgment and thought content normal.       Assessment:     1. Acquired hypothyroidism TSH.  2. Fibromyalgia Stable.  3. Cough - Spirometry with Graph  4. Bronchitis Most likely viral. - doxycycline (VIBRA-TABS) 100 MG tablet; Take 1 tablet (100 mg total) by mouth 2 (two) times daily.  Dispense: 14 tablet; Refill: 0 Plan:     Supportive care--Doxy if she worsens.

## 2015-05-14 ENCOUNTER — Telehealth: Payer: Self-pay

## 2015-05-14 NOTE — Telephone Encounter (Signed)
Patient advised-aa 

## 2015-05-14 NOTE — Telephone Encounter (Signed)
LMTCB  aa 

## 2015-05-14 NOTE — Telephone Encounter (Signed)
-----   Message from Jerrol Banana., MD sent at 05/14/2015 11:47 AM EDT ----- Normal CXR.

## 2015-05-16 ENCOUNTER — Encounter: Payer: Self-pay | Admitting: Family Medicine

## 2015-05-22 LAB — CBC WITH DIFFERENTIAL/PLATELET
BASOS ABS: 0 10*3/uL (ref 0.0–0.2)
Basos: 1 %
EOS (ABSOLUTE): 0.1 10*3/uL (ref 0.0–0.4)
Eos: 2 %
HEMATOCRIT: 41.4 % (ref 34.0–46.6)
Hemoglobin: 13.6 g/dL (ref 11.1–15.9)
Immature Grans (Abs): 0 10*3/uL (ref 0.0–0.1)
Immature Granulocytes: 0 %
Lymphocytes Absolute: 1.9 10*3/uL (ref 0.7–3.1)
Lymphs: 43 %
MCH: 31.9 pg (ref 26.6–33.0)
MCHC: 32.9 g/dL (ref 31.5–35.7)
MCV: 97 fL (ref 79–97)
MONOS ABS: 0.4 10*3/uL (ref 0.1–0.9)
Monocytes: 9 %
NEUTROS ABS: 2 10*3/uL (ref 1.4–7.0)
NEUTROS PCT: 45 %
Platelets: 249 10*3/uL (ref 150–379)
RBC: 4.26 x10E6/uL (ref 3.77–5.28)
RDW: 14 % (ref 12.3–15.4)
WBC: 4.5 10*3/uL (ref 3.4–10.8)

## 2015-05-22 LAB — COMPREHENSIVE METABOLIC PANEL
ALBUMIN: 4.2 g/dL (ref 3.5–4.8)
ALT: 21 IU/L (ref 0–32)
AST: 20 IU/L (ref 0–40)
Albumin/Globulin Ratio: 2 (ref 1.1–2.5)
Alkaline Phosphatase: 86 IU/L (ref 39–117)
BILIRUBIN TOTAL: 0.3 mg/dL (ref 0.0–1.2)
BUN / CREAT RATIO: 19 (ref 11–26)
BUN: 15 mg/dL (ref 8–27)
CALCIUM: 9.5 mg/dL (ref 8.7–10.3)
CHLORIDE: 101 mmol/L (ref 97–108)
CO2: 24 mmol/L (ref 18–29)
CREATININE: 0.79 mg/dL (ref 0.57–1.00)
GFR calc Af Amer: 88 mL/min/{1.73_m2} (ref >59)
GFR calc non Af Amer: 76 mL/min/{1.73_m2} (ref >59)
Globulin, Total: 2.1 g/dL (ref 1.5–4.5)
Glucose: 102 mg/dL — ABNORMAL HIGH (ref 65–99)
Potassium: 5.1 mmol/L (ref 3.5–5.2)
Sodium: 138 mmol/L (ref 134–144)
TOTAL PROTEIN: 6.3 g/dL (ref 6.0–8.5)

## 2015-05-22 LAB — LIPID PANEL WITH LDL/HDL RATIO
Cholesterol, Total: 234 mg/dL — ABNORMAL HIGH (ref 100–199)
HDL: 57 mg/dL (ref >39)
LDL CALC: 119 mg/dL — AB (ref 0–99)
LDL/HDL RATIO: 2.1 ratio (ref 0.0–3.2)
Triglycerides: 292 mg/dL — ABNORMAL HIGH (ref 0–149)
VLDL Cholesterol Cal: 58 mg/dL — ABNORMAL HIGH (ref 5–40)

## 2015-05-22 LAB — TSH: TSH: 4.12 u[IU]/mL (ref 0.450–4.500)

## 2015-05-24 ENCOUNTER — Telehealth: Payer: Self-pay

## 2015-05-24 NOTE — Telephone Encounter (Signed)
-----   Message from Jerrol Banana., MD sent at 05/23/2015  3:20 PM EDT ----- Labs stable. Please advise patient.   diete and exercise with low fat/low carb diet and 20-30 minute aerobic exercise 4-6 days per week.

## 2015-05-24 NOTE — Telephone Encounter (Signed)
Patient advised  ED 

## 2015-06-14 ENCOUNTER — Telehealth: Payer: Self-pay | Admitting: Family Medicine

## 2015-06-14 NOTE — Telephone Encounter (Signed)
Pt received bill form [facility] denying payment for her labs done 05-21-15.   She does not understand why [employer] did not pay.  Her call back is [phone].

## 2015-06-15 NOTE — Telephone Encounter (Signed)
I will need the labs ordered and the codes attached those labs to know if we can help her or not.

## 2015-06-17 NOTE — Telephone Encounter (Signed)
Discussed this with the patient.  Have advised her that when she gets the EOB from her insurance company to bring a copy to [place] as that will help to determine why they are not paying.  From what I could see in the chart the labs ordered had appropriate coding.  Melissa [name]

## 2015-08-15 ENCOUNTER — Encounter: Payer: Self-pay | Admitting: Family Medicine

## 2015-09-04 ENCOUNTER — Ambulatory Visit (INDEPENDENT_AMBULATORY_CARE_PROVIDER_SITE_OTHER): Payer: Commercial Managed Care - HMO | Admitting: Family Medicine

## 2015-09-04 DIAGNOSIS — Z23 Encounter for immunization: Secondary | ICD-10-CM

## 2015-10-24 ENCOUNTER — Other Ambulatory Visit: Payer: Self-pay | Admitting: *Deleted

## 2015-10-24 DIAGNOSIS — Z1231 Encounter for screening mammogram for malignant neoplasm of breast: Secondary | ICD-10-CM

## 2015-11-11 ENCOUNTER — Encounter: Payer: Self-pay | Admitting: Family Medicine

## 2015-11-11 ENCOUNTER — Ambulatory Visit
Admission: RE | Admit: 2015-11-11 | Discharge: 2015-11-11 | Disposition: A | Discharge status: Home / Self Care | Payer: Commercial Managed Care - HMO | Source: Ambulatory Visit | Attending: Family Medicine | Admitting: Family Medicine

## 2015-11-11 ENCOUNTER — Ambulatory Visit (INDEPENDENT_AMBULATORY_CARE_PROVIDER_SITE_OTHER): Payer: Commercial Managed Care - HMO | Admitting: Family Medicine

## 2015-11-11 VITALS — BP 114/60 | HR 80 | Temp 97.9°F | Resp 16 | Ht 62.75 in | Wt 172.0 lb

## 2015-11-11 DIAGNOSIS — M79642 Pain in left hand: Principal | ICD-10-CM

## 2015-11-11 DIAGNOSIS — M79641 Pain in right hand: Secondary | ICD-10-CM

## 2015-11-11 DIAGNOSIS — K625 Hemorrhage of anus and rectum: Secondary | ICD-10-CM | POA: Diagnosis not present

## 2015-11-11 DIAGNOSIS — Z1211 Encounter for screening for malignant neoplasm of colon: Secondary | ICD-10-CM

## 2015-11-11 DIAGNOSIS — Z0000 Encounter for general adult medical examination without abnormal findings: Secondary | ICD-10-CM | POA: Diagnosis not present

## 2015-11-11 DIAGNOSIS — N952 Postmenopausal atrophic vaginitis: Secondary | ICD-10-CM | POA: Diagnosis not present

## 2015-11-11 MED ORDER — HYDROCOD POLST-CPM POLST ER 10-8 MG/5ML PO SUER: 5.0000 mL | Freq: Two times a day (BID) | ORAL | Status: DC | PRN

## 2015-11-11 NOTE — Progress Notes (Signed)
Patient ID: Melissa [name], female   DOB: May 11, 1946, 69 y.o.   MRN: [id number]  Visit Date: 11/11/2015  Today's Provider: Wilhemena Durie, MD   Chief Complaint  Patient presents with  . Medicare Wellness   Subjective:   Melissa [name] is a 69 y.o. female who presents today for her Subsequent Annual Wellness Visit. She feels fairly well. . She reports she is sleeping fairly well. Immunization History  Administered Date(s) Administered  . Influenza, High Dose Seasonal PF 09/04/2015  . Influenza-Unspecified 10/07/2013, 08/07/2014  . Pneumococcal Conjugate-13 10/25/2014  . Pneumococcal Polysaccharide-23 04/09/2006, 12/10/2011  . Tdap 09/16/2011   LAST Colonoscopy 12/18/11 polyps-repeat 5 years.  Pap smear 07/02/08-has had hysterectomy  Mammogram 12/14/14  BMD 08/02/13 osteopenia and some osteoporosis.   Review of Systems  Constitutional: Positive for fatigue.  HENT: Positive for tinnitus.   Eyes: Positive for itching.  Respiratory: Positive for cough.   Cardiovascular: Positive for leg swelling.  Gastrointestinal: Positive for anal bleeding.  Endocrine: Positive for heat intolerance.  Genitourinary: Positive for enuresis. Negative for vaginal bleeding.       Significant dyspareunia. Severe vaginal dryness.  Musculoskeletal: Positive for myalgias and arthralgias.  Skin: Negative.   Allergic/Immunologic: Positive for environmental allergies.  Neurological: Negative.   Hematological: Bruises/bleeds easily.  Psychiatric/Behavioral: Negative.     Patient Active Problem List   Diagnosis Date Noted  . Atrophic vaginitis 04/11/2015  . CA of rectum ([place]) 04/11/2015  . Chronic diarrhea 04/11/2015  . CD (contact dermatitis) 04/11/2015  . Affective disorder, major ([place]) 04/11/2015  . Bloodgood disease 04/11/2015  . Fibrositis 04/11/2015  . Acid reflux 04/11/2015  . Acquired hypothyroidism 04/11/2015  . Hypersomnia 04/11/2015  . Internal hemorrhoids 04/11/2015  .  Muscle ache 04/11/2015  . Neuralgia neuritis, sciatic nerve 04/11/2015  . Herpes zoster 04/11/2015  . Apnea, sleep 04/11/2015  . 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 Inflammation of urethra 04/11/2015  . 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 Fibromyalgia   . Diffuse cystic mastopathy   . Migraines   . Other nonspecific finding on examination of urine   . ANKLE PAIN, RIGHT 10/01/2009  . 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 ANKLE SPRAIN, RIGHT 10/01/2009  . HYPERLIPIDEMIA 11/22/2007  . OSTEOARTHRITIS 11/22/2007  . PLANTAR FASCIITIS, BILATERAL 11/22/2007  . COLON CANCER, HX OF 11/22/2007    Social History   Social History  . Marital Status: Married    Spouse Name: N/A  . Number of Children: N/A  . Years of Education: N/A   Occupational History  . Not on file.   Social History Main Topics  . Smoking status: Never Smoker   . Smokeless tobacco: Never Used  . Alcohol Use: No  . Drug Use: No  . Sexual Activity: Not on file   Other Topics Concern  . Not on file   Social History Narrative    Past Surgical History  Procedure Laterality Date  . Abdominal hysterectomy  1986  . Colonoscopy  2008, 2013  . Tonsillectomy  2005  . Anal mass exc  2014  . Cosmetic surgery  20008    FACIAL  . Breast surgery  2005    BREAST REDUCTION SX Breast Biopsy (left) Neg for cancer  . Abdominal hysterectomy  1986    Secondary to [place]    Her family history includes Cancer in her father; Diabetes in her father; Heart disease in her maternal grandmother; Stroke in her sister.    Outpatient  Prescriptions Prior to Visit  Medication Sig Dispense Refill  . CHOLESTYRAMINE PO CHOLESTYRAMINE (Powder)  1 (one) Packet two times daily for 0 days  Quantity: 180;  Refills: 3   Ordered :08-Jan-2014  Miguel Aschoff MD;  Started 08-Jan-2014 Active    . conjugated estrogens (PREMARIN) vaginal cream Place vaginally.    . diphenhydrAMINE (BENADRYL) 25 MG tablet Take 25 mg by mouth  every 6 (six) hours as needed for itching.    . levothyroxine (SYNTHROID, LEVOTHROID) 175 MCG tablet Take by mouth.    . loperamide (IMODIUM) 2 MG capsule Take 2 mg by mouth 4 (four) times daily as needed for diarrhea or loose stools.    . nabumetone (RELAFEN) 500 MG tablet Take 500 mg by mouth daily.    . sertraline (ZOLOFT) 100 MG tablet Take 100 mg by mouth 2 (two) times daily.     Marland Kitchen doxycycline (VIBRA-TABS) 100 MG tablet Take 1 tablet (100 mg total) by mouth 2 (two) times daily. 14 tablet 0  . predniSONE (DELTASONE) 50 MG tablet Take one tablet 13 hours, 7 hours, and one hour prior to procedure. (Patient not taking: Reported on 05/09/2015) 3 tablet 0   No facility-administered medications prior to visit.    Allergies  Allergen Reactions  . Pentazocine Lactate   . Propoxyphene Hcl   . Propoxyphene N-Acetaminophen   . Tramadol Hcl   . Tramadol Hcl   . Talwin  [Pentazocine]   . Ciprofloxacin Rash  . Ciprofloxacin Hcl Rash  . Dicloxacillin Rash  . Dicloxacillin Sodium Rash  . Darvon  [Propoxyphene] Rash    Hallucinations Hallucinations.  . Etodolac Rash  . Iodine Rash    Patient Care Team: Jerrol Banana., MD as PCP - General (Unknown Physician Specialty) Christene Lye, MD (General Surgery) Jerrol Banana., MD (Family Medicine)  Objective:   Vitals:  Filed Vitals:   11/11/15 1415  BP: 114/60  Pulse: 80  Temp: 97.9 F (36.6 C)  Resp: 16  Height: 5' 2.75" (1.594 m)  Weight: 172 lb (78.019 kg)    Physical Exam  Constitutional: She is oriented to person, place, and time. She appears well-developed.  HENT:  Head: Normocephalic and atraumatic.  Right Ear: External ear normal.  Left Ear: External ear normal.  Nose: Nose normal.  Mouth/Throat: Oropharynx is clear and moist.  Eyes: Conjunctivae are normal. Pupils are equal, round, and reactive to light.  Neck: Normal range of motion. Neck supple.  Cardiovascular: Normal rate, regular rhythm, normal  heart sounds and intact distal pulses.   No murmur heard. Pulmonary/Chest: Effort normal and breath sounds normal. No respiratory distress. She has no wheezes.  Abdominal: Soft. Bowel sounds are normal. She exhibits no distension. There is no tenderness.  Genitourinary: Vagina normal. Guaiac positive stool. No vaginal discharge found.  Atrophic, extremely dry vaginal mucosa.  Musculoskeletal: Normal range of motion. She exhibits no edema or tenderness.  Neurological: She is alert and oriented to person, place, and time.  Skin: Skin is warm and dry. No rash noted. No erythema.  Psychiatric: She has a normal mood and affect. Her behavior is normal. Judgment and thought content normal.    Activities of Daily Living In your present state of health, do you have any difficulty performing the following activities: 11/11/2015  Hearing? N  Vision? N  Difficulty concentrating or making decisions? N  Walking or climbing stairs? N  Dressing or bathing? N  Doing errands, shopping? N    Fall Risk  Assessment Fall Risk  11/11/2015  Falls in the past year? No     Depression Screen PHQ 2/9 Scores 11/11/2015  PHQ - 2 Score 0    Cognitive Testing - 6-CIT    Year: 0 4 points  Month: 0 3 points  Memorize "Pia Mau, [address], [place]"  Time (within 1 hour:) 0 3 points  Count backwards from 20: 0 2 4 points  Name months of year: 0 2 4 points  Repeat Address: 0 2 4 6 8 10  points   Total Score: 3/28  Interpretation : Normal (0-7) Abnormal (8-28)    Assessment & Plan:   1. Medicare annual wellness visit, subsequent Reviewed patient's Family Medical History Reviewed and updated list of patient's medical providers Assessment of cognitive impairment was done Assessed patient's functional ability Established a written schedule for health screening [place] Completed and Reviewed   2. Colon cancer screening Positive test today but she has been having some anal  bleeding/fissure present on the exam. - IFOBT POC (occult bld, rslt in office)  3. Vaginal atrophy/postmenopausal atrophic vaginitis Refer back to Dr. Melody Haver. - Ambulatory referral to Gynecology  4. Bilateral hand pain Order xrays. Patient will try Tylenol at bedtime and will follow up with [place] on this in 1 to 3 months, may need to try Meloxicam or Norco. I think she will develop osteoarthritis in her hands if it is not present already. - DG Hand Complete Left; Future - DG Hand Complete Right; Future  5. Rectal bleeding Mild anal fissure noted. 6.Milk cough Improving--tussionex for nocturnal cough for now. I have done the exam and reviewed the above chart and it is accurate to the best of my knowledge.  Patient was seen and examined by Dr. Eulas Post and note was scribed by Theressa Millard, RMA.      Miguel Aschoff MD [facility] 11/11/2015 2:17 PM  ------------------------------------------------------------------------------------------------------------

## 2015-11-21 ENCOUNTER — Encounter: Payer: Self-pay | Admitting: Obstetrics and Gynecology

## 2015-11-21 ENCOUNTER — Ambulatory Visit (INDEPENDENT_AMBULATORY_CARE_PROVIDER_SITE_OTHER): Payer: Commercial Managed Care - HMO | Admitting: Obstetrics and Gynecology

## 2015-11-21 VITALS — BP 136/69 | HR 74 | Ht 62.0 in | Wt 173.0 lb

## 2015-11-21 DIAGNOSIS — N762 Acute vulvitis: Secondary | ICD-10-CM

## 2015-11-21 DIAGNOSIS — Z85048 Personal history of other malignant neoplasm of rectum, rectosigmoid junction, and anus: Secondary | ICD-10-CM | POA: Diagnosis not present

## 2015-11-21 DIAGNOSIS — R197 Diarrhea, unspecified: Secondary | ICD-10-CM

## 2015-11-21 NOTE — Patient Instructions (Signed)
1.  Return in 1 week for vulvar biopsies top  Rule out lichen sclerosus. 2.  May use Desitin ointment or Vaseline for perianal protection

## 2015-11-21 NOTE — Progress Notes (Signed)
GYN ENCOUNTER NOTE  Subjective:       Melissa [name] is a 69 y.o. [id number] female is here for gynecologic evaluation of the following issues:  1.  periclitoral Lesion.   2.  Perianal inflammation. 3.  History of anal cancer, status post resection, chemotherapy, XRT 12 years ago  Patient presents for evaluation of right periclitoral lesion that was noted several weeks ago.   She used a and D ointment with some improvement. Patient also has been experiencing some perianal inflammation as well.  She has had to use pads and diapers because of some incontinence issues related to her anal cancer and treatments.  Patient is status post hysterectomy-TVH; ovaries are still Intact Patient is status post surgical excision of anal cancer in situ 12 years ago.  She also had an excisional biopsy performed 3-4 years ago.   Gynecologic History No LMP recorded. Patient has had a hysterectomy. Contraception: Status post TVH   Obstetric History OB History  Gravida Para Term Preterm AB SAB TAB Ectopic Multiple Living  2 2 2       2     # Outcome Date GA Lbr Len/2nd Weight Sex Delivery Anes PTL Lv  2 Term 1971   7 lb 4.8 oz (3.311 kg) M Vag-Spont   Y  1 Term 1968   7 lb 4.8 oz (3.311 kg) M Vag-Spont   Y      Past Medical History  Diagnosis Date  . Fibromyalgia 2006  . Hemorrhoids 2006  . Diffuse cystic mastopathy 12/30/12  . Migraines 2006  . Cancer ([place]) 01/16/2004    anorectum  . Carcinoma in situ of anal canal [id number]  . Other nonspecific finding on examination of urine [id number]  . Fibroid     Past Surgical History  Procedure Laterality Date  . Colonoscopy  2008, 2013  . Tonsillectomy  2005  . Anal mass exc  2014  . Breast surgery  2005    BREAST REDUCTION SX Breast Biopsy (left) Neg for cancer  . Cosmetic surgery  20008    FACIAL  . Vaginal hysterectomy  1986    Secondary to [place]    Current Outpatient Prescriptions on File Prior to Visit  Medication Sig Dispense  Refill  . CHOLESTYRAMINE PO CHOLESTYRAMINE (Powder)  1 (one) Packet two times daily for 0 days  Quantity: 180;  Refills: 3   Ordered :08-Jan-2014  Melissa Aschoff MD;  Started 08-Jan-2014 Active    . diphenhydrAMINE (BENADRYL) 25 MG tablet Take 25 mg by mouth every 6 (six) hours as needed for itching.    . Incontinence Supply Disposable (BLADDER CONTROL PADS ULTRA) MISC     . levothyroxine (SYNTHROID, LEVOTHROID) 175 MCG tablet Take by mouth.    . loperamide (IMODIUM) 2 MG capsule Take 2 mg by mouth 4 (four) times daily as needed for diarrhea or loose stools.    . nabumetone (RELAFEN) 500 MG tablet Take 500 mg by mouth daily.    . sertraline (ZOLOFT) 100 MG tablet Take 100 mg by mouth 2 (two) times daily.      No current facility-administered medications on file prior to visit.    Allergies  Allergen Reactions  . Pentazocine Lactate   . Propoxyphene Hcl   . Propoxyphene N-Acetaminophen   . Tramadol Hcl   . Tramadol Hcl   . Talwin  [Pentazocine]   . Ciprofloxacin Rash  . Ciprofloxacin Hcl Rash  . Dicloxacillin Rash  . Dicloxacillin Sodium Rash  . Darvon  [Propoxyphene]  Rash    Hallucinations Hallucinations.  . Etodolac Rash  . Iodine Rash    Social History   Social History  . Marital Status: Married    Spouse Name: N/A  . Number of Children: N/A  . Years of Education: N/A   Occupational History  . Not on file.   Social History Main Topics  . Smoking status: Never Smoker   . Smokeless tobacco: Never Used  . Alcohol Use: No  . Drug Use: No  . Sexual Activity: Not Currently    Birth Control/ Protection: Surgical   Other Topics Concern  . Not on file   Social History Narrative    Family History  Problem Relation Age of Onset  . Diabetes Father   . Cancer Father   . Stroke Sister   . Heart disease Maternal Grandmother   . Breast cancer Paternal Aunt   . Ovarian cancer Neg Hx     The following portions of the patient's history were reviewed and updated as  appropriate: allergies, current medications, past family history, past medical history, past social history, past surgical history and problem list.  Review of Systems Vulvar rawness for 2-3 weeks unrelieved with sitz bath and a and D Ointment Anal irritation for 2-3 weeks Intermittent diarrhea, requiring diapers and pad usage  Objective:   BP 136/69 mmHg  Pulse 74  Ht 5\' 2"  (1.575 m)  Wt 173 lb (78.472 kg)  BMI 31.63 kg/m2 CONSTITUTIONAL: Well-developed, well-nourished female in no acute distress.  HENT:  Normocephalic, atraumatic.  NECK: Normal range of motion, supple, no masses.  Normal thyroid.  SKIN: Skin is warm and dry. No rash noted. Not diaphoretic. No erythema. No pallor. [name]: Alert and oriented to person, place, and time. PSYCHIATRIC: Normal mood and affect. Normal behavior. Normal judgment and thought content. CARDIOVASCULAR:Not Examined RESPIRATORY: Not Examined BREASTS: Not Examined ABDOMEN: Soft, non distended; Non tender.  No Organomegaly. PELVIC:  External Genitalia: Moderate atrophy; agglutination of labia minora to the labia majora; right-sided faint leukoplakia on right labia majora; posterior fourchette with faint white epithelium/leukoplakia  BUS: Normal  Vagina: Atrophic  Cervix: Surgically absent  Uterus: Surgically absent  Adnexa: Normal  RV: Normal; Grossly normal External exam  Bladder: Nontender MUSCULOSKELETAL: Normal range of motion. No tenderness.  No cyanosis, clubbing, or edema.     Assessment:   1.  Vulvar irritation of 2-3 weeks' duration. 2.  Perianal inflammation of 2-3 weeks' duration 3.  History of anal cancer, status post excision 12 years ago. 4.  Chronic diarrhea. 5.  Suspicion of Lichen sclerosis   Plan:   1.  Return in 2 weeks for vulvar biopsy 2. 2.  May use Desitin for Protection of perianal region from diarrhea.  Brayton Mars, MD  Note: This dictation was prepared with Dragon dictation along with  smaller phrase technology. Any transcriptional errors that result from this process are unintentional.

## 2015-11-22 ENCOUNTER — Encounter: Payer: Self-pay | Admitting: Obstetrics and Gynecology

## 2015-11-22 DIAGNOSIS — N762 Acute vulvitis: Secondary | ICD-10-CM

## 2015-11-22 HISTORY — DX: Acute vulvitis: N76.2

## 2015-11-28 ENCOUNTER — Encounter: Payer: Self-pay | Admitting: Obstetrics and Gynecology

## 2015-11-28 ENCOUNTER — Ambulatory Visit (INDEPENDENT_AMBULATORY_CARE_PROVIDER_SITE_OTHER): Payer: Commercial Managed Care - HMO | Admitting: Obstetrics and Gynecology

## 2015-11-28 VITALS — BP 116/69 | HR 84 | Ht 62.0 in | Wt 173.5 lb

## 2015-11-28 DIAGNOSIS — N9089 Other specified noninflammatory disorders of vulva and perineum: Secondary | ICD-10-CM | POA: Diagnosis not present

## 2015-11-28 DIAGNOSIS — Z85048 Personal history of other malignant neoplasm of rectum, rectosigmoid junction, and anus: Secondary | ICD-10-CM

## 2015-11-28 DIAGNOSIS — N762 Acute vulvitis: Secondary | ICD-10-CM

## 2015-11-28 NOTE — Patient Instructions (Signed)
1. Keep biopsy sites clean and dry 2. Return in 3 weeks for follow-up on biopsies and further planning

## 2015-12-03 LAB — PATHOLOGY

## 2015-12-03 NOTE — Progress Notes (Signed)
Chief complaint: 1.Vulvar biopsy 2  PROCEDURE: The vulva is prepped with Betadine solution.  1% lidocaine without epinephrine is infiltrated into biopsy sites (right labia majora, posterior fourchette); a total of 5 cc is injected.  A 3.5 mm punch biopsy 2 is taken in standard fashion with the specimens being placed in formalin and sent to pathology.  Silver nitrate anesthetic is used to obtain hemostasis.  Blood loss is minimal.  Procedure was well-tolerated.  ASSESSMENT: 1.  Vulvitis, unclear etiology.  PLAN: 1.  Vulvar biopsy 2-3.5 mm punch. 2.  Routine post procedure precautions given. 3.  Return in 2 weeks for follow-up and further management planning.  Brayton Mars, MD

## 2015-12-12 ENCOUNTER — Ambulatory Visit (INDEPENDENT_AMBULATORY_CARE_PROVIDER_SITE_OTHER): Payer: PPO | Admitting: Obstetrics and Gynecology

## 2015-12-12 ENCOUNTER — Encounter: Payer: Self-pay | Admitting: Obstetrics and Gynecology

## 2015-12-12 VITALS — BP 100/64 | HR 90 | Ht 62.0 in | Wt 172.0 lb

## 2015-12-12 DIAGNOSIS — Z85048 Personal history of other malignant neoplasm of rectum, rectosigmoid junction, and anus: Secondary | ICD-10-CM | POA: Diagnosis not present

## 2015-12-12 DIAGNOSIS — L900 Lichen sclerosus et atrophicus: Secondary | ICD-10-CM | POA: Diagnosis not present

## 2015-12-12 MED ORDER — CLOBETASOL PROPIONATE 0.05 % EX OINT: 1.0000 "application " | TOPICAL_OINTMENT | Freq: Two times a day (BID) | CUTANEOUS | Status: DC

## 2015-12-12 NOTE — Patient Instructions (Signed)
1.  Massage the Temovate ointment 0.05% into the areas of irritation around the vulva and rectum twice a day for 2 weeks, then daily for 4 weeks. 2.  Return in 6 weeks for follow-up

## 2015-12-12 NOTE — Progress Notes (Signed)
Chief complaint: 1.  Follow-up on vulvar biopsies. 2.  Chronic vulvitis.  Melissa [name] presents today for follow-up 2 weeks after vulvar biopsies for  Chronic vulvar irritation.  Pathology: Lichen sclerosus  OBJECTIVE:BP 100/64 mmHg  Pulse 90  Ht 5\' 2"  (1.575 m)  Wt 172 lb (78.019 kg)  BMI 31.45 kg/m2 Pelvic exam: External Genitalia: Moderate atrophy; agglutination of labia minora to the labia majora; right-sided faint leukoplakia on right labia majora; posterior fourchette with faint white epithelium/leukoplakia; Symptoms areRight labia majora biopsy and posterior fourchette Biopsies healing, not yet re-epithelialized.  No evidence of infection, induration or drainage.  ASSESSMENT: 1.  Lichen sclerosus 2.  History of anal cancer, status post resection, status post chemotherapy, status post XRT 12 years ago  PLAN: 1.  Temovate ointment 0.05% twice daily for 2 weeks, then daily 4 weeks. 2.  Return in 6 weeks for follow-up  A total of 15 minutes were spent face-to-face with the patient during this encounter and over half of that time dealt with counseling and coordination of care.  Brayton Mars, MD  Note: This dictation was prepared with Dragon dictation along with smaller phrase technology. Any transcriptional errors that result from this process are unintentional.

## 2015-12-16 ENCOUNTER — Ambulatory Visit
Admission: RE | Admit: 2015-12-16 | Discharge: 2015-12-16 | Disposition: A | Discharge status: Home / Self Care | Payer: PPO | Source: Ambulatory Visit | Attending: General Surgery | Admitting: General Surgery

## 2015-12-16 DIAGNOSIS — Z1231 Encounter for screening mammogram for malignant neoplasm of breast: Secondary | ICD-10-CM | POA: Diagnosis not present

## 2015-12-25 ENCOUNTER — Ambulatory Visit (INDEPENDENT_AMBULATORY_CARE_PROVIDER_SITE_OTHER): Payer: PPO | Admitting: General Surgery

## 2015-12-25 ENCOUNTER — Encounter: Payer: Self-pay | Admitting: General Surgery

## 2015-12-25 VITALS — BP 124/76 | HR 76 | Resp 12 | Ht 62.0 in | Wt 172.0 lb

## 2015-12-25 DIAGNOSIS — N6012 Diffuse cystic mastopathy of left breast: Secondary | ICD-10-CM | POA: Diagnosis not present

## 2015-12-25 DIAGNOSIS — Z86004 Personal history of in-situ neoplasm of other and unspecified digestive organs: Secondary | ICD-10-CM

## 2015-12-25 DIAGNOSIS — N6011 Diffuse cystic mastopathy of right breast: Secondary | ICD-10-CM | POA: Diagnosis not present

## 2015-12-25 DIAGNOSIS — Z86008 Personal history of in-situ neoplasm of other site: Secondary | ICD-10-CM

## 2015-12-25 NOTE — Progress Notes (Signed)
Patient ID: Melissa [name], female   DOB: [birth year], 70 y.o.   MRN: [id number]  Chief Complaint  Patient presents with  . Follow-up    mammogram    HPI Melissa [name] is a 70 y.o. female who presents for a breast evaluation. The most recent mammogram was done on 12/16/15.  Patient does perform regular self breast checks and gets regular mammograms done.  She has minimal rectal bleeding unchanged from the past resulting from radiation changes. I have reviewed the history of present illness with the patient.   HPI  Past Medical History  Diagnosis Date  . Fibromyalgia 2006  . Hemorrhoids 2006  . Diffuse cystic mastopathy 12/30/12  . Migraines 2006  . Carcinoma in situ of anal canal [id number]  . Other nonspecific finding on examination of urine [id number]  . Fibroid   . Vulvitis 11/22/2015  . Cancer ([place]) 01/16/2004    anorectum chemo/ rad    Past Surgical History  Procedure Laterality Date  . Colonoscopy  2008, 2013  . Tonsillectomy  2005  . Anal mass exc  2014  . Breast surgery  2005    BREAST REDUCTION SX Breast Biopsy (left) Neg for cancer  . Cosmetic surgery  20008    FACIAL  . Vaginal hysterectomy  1986    Secondary to [place]  . Reduction mammaplasty Bilateral   . Breast biopsy Bilateral     neg    Family History  Problem Relation Age of Onset  . Diabetes Father   . Cancer Father   . Stroke Sister   . Heart disease Maternal Grandmother   . Breast cancer Paternal Aunt   . Ovarian cancer Neg Hx     Social History Social History  Substance Use Topics  . Smoking status: Never Smoker   . Smokeless tobacco: Never Used  . Alcohol Use: No    Allergies  Allergen Reactions  . Pentazocine Lactate   . Propoxyphene Hcl   . Propoxyphene N-Acetaminophen   . Tramadol Hcl   . Tramadol Hcl   . Talwin  [Pentazocine]   . Ciprofloxacin Rash  . Ciprofloxacin Hcl Rash  . Dicloxacillin Rash  . Dicloxacillin Sodium Rash  . Darvon  [Propoxyphene] Rash     Hallucinations Hallucinations.  . Etodolac Rash  . Iodine Rash    Current Outpatient Prescriptions  Medication Sig Dispense Refill  . chlorpheniramine-HYDROcodone (TUSSIONEX) 10-8 MG/5ML SUER TAKE 1 TEASPOONFUL ( 5 MLS) BY MOUTH EVERY 12 HOURS AS NEEDED FOR COUGH  0  . CHOLESTYRAMINE PO CHOLESTYRAMINE (Powder)  1 (one) Packet two times daily for 0 days  Quantity: 180;  Refills: 3   Ordered :08-Jan-2014  Miguel Aschoff MD;  Started 08-Jan-2014 Active    . clobetasol ointment (TEMOVATE) [id number] % Apply 1 application topically 2 (two) times daily. Twice a day for 14 days then daily for 4 weeks 30 g 3  . diphenhydrAMINE (BENADRYL) 25 MG tablet Take 25 mg by mouth every 6 (six) hours as needed for itching.    . Incontinence Supply Disposable (BLADDER CONTROL PADS ULTRA) MISC     . levothyroxine (SYNTHROID, LEVOTHROID) 175 MCG tablet Take by mouth.    . loperamide (IMODIUM) 2 MG capsule Take 2 mg by mouth 4 (four) times daily as needed for diarrhea or loose stools.    . nabumetone (RELAFEN) 500 MG tablet Take 500 mg by mouth daily.    . sertraline (ZOLOFT) 100 MG tablet Take 100 mg by mouth 2 (two) times daily.  No current facility-administered medications for this visit.    Review of Systems Review of Systems  Constitutional: Negative.   Respiratory: Negative.   Cardiovascular: Negative.     Blood pressure 124/76, pulse 76, resp. rate 12, height 5\' 2"  (1.575 m), weight 172 lb (78.019 kg).  Physical Exam Physical Exam  Constitutional: She is oriented to person, place, and time. She appears well-developed and well-nourished.  HENT:  Mouth/Throat: Oropharynx is clear and moist.  Eyes: Conjunctivae are normal. No scleral icterus.  Neck: Neck supple.  Cardiovascular: Normal rate, regular rhythm and normal heart sounds.   Pulmonary/Chest: Effort normal and breath sounds normal. Right breast exhibits no inverted nipple, no mass, no nipple discharge, no skin change and no tenderness.  Left breast exhibits no inverted nipple, no mass, no nipple discharge, no skin change and no tenderness.  Abdominal: Soft. Normal appearance and bowel sounds are normal. There is no hepatomegaly. There is no tenderness.  Lymphadenopathy:    She has no cervical adenopathy.    She has no axillary adenopathy.  Neurological: She is alert and oriented to person, place, and time.  Skin: Skin is warm and dry.  Psychiatric: Her behavior is normal.    Data Reviewed Mammogram reviewed and stable  Assessment    Stable exam. History of ca in situ of anal canal. FCD.  She has had radiation associated proctitis with some bleeding, but this has been better in the last few years.   Plan         Patient to return in one year bilateral screening mammogram. colonoscopy due 2018.  PCP:  Melissa [name], Melissa [name]  This information has been scribed by Gaspar Cola CMA.   Oisin Yoakum G 12/25/2015, 2:59 PM

## 2015-12-25 NOTE — Patient Instructions (Addendum)
The patient is aware to call back for any questions or concerns. Continue monthly self breast exam Colonoscopy due 2018

## 2016-01-23 ENCOUNTER — Encounter: Payer: Self-pay | Admitting: Obstetrics and Gynecology

## 2016-01-23 ENCOUNTER — Ambulatory Visit (INDEPENDENT_AMBULATORY_CARE_PROVIDER_SITE_OTHER): Payer: PPO | Admitting: Obstetrics and Gynecology

## 2016-01-23 VITALS — BP 94/64 | HR 103 | Ht 62.0 in | Wt 171.7 lb

## 2016-01-23 DIAGNOSIS — L900 Lichen sclerosus et atrophicus: Secondary | ICD-10-CM | POA: Diagnosis not present

## 2016-01-23 DIAGNOSIS — Z85048 Personal history of other malignant neoplasm of rectum, rectosigmoid junction, and anus: Secondary | ICD-10-CM

## 2016-01-23 DIAGNOSIS — N952 Postmenopausal atrophic vaginitis: Secondary | ICD-10-CM

## 2016-01-23 NOTE — Progress Notes (Signed)
Chief complaint: 1. Lichen sclerosus 2. 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 Diagnosis: Lichen sclerosus [id number] biopsy verified Most recent Therapy: Status post Temovate ointment twice a day for 2 weeks, then daily 4 weeks Symptoms: No vulvar itching or burning   OBJECTIVE: BP 94/64 mmHg  Pulse 103  Ht 5\' 2"  (1.575 m)  Wt 171 lb 11.2 oz (77.883 kg)  BMI 31.40 kg/m2 Pelvic exam: External Genitalia: Moderate atrophy; agglutination of labia minora to the labia majora; right-sided faint leukoplakia/hypopigmentation on right labia majora; posterior fourchette with faint white epithelium/leukoplakia; Right labia majora biopsy and posterior fourchette Biopsies healing, are now re-epithelialized. No evidence of infection, induration or drainage. Vagina: Severe atrophy BUS: Normal  ASSESSMENT: 1. Lichen sclerosus 2. History of anal cancer, status post resection, status post chemotherapy, status post XRT 12 years ago 3. Severe vaginal atrophy  PLAN: 1. Temovate ointment 0.05% every other day for 4 weeks, then twice a week externally to vulva 2. Premarin cream 1/2 g intravaginal twice a week 3. Return in 2 months for follow-up  A total of 15 minutes were spent face-to-face with the patient during this encounter and over half of that time dealt with counseling and coordination of care.  Brayton Mars, MD  Note: This dictation was prepared with Dragon dictation along with smaller phrase technology. Any transcriptional errors that result from this process are unintentional.

## 2016-01-23 NOTE — Patient Instructions (Signed)
1. Use the clobetasol ointment every other day for 1 month, then twice a week externally. 2. Use the Premarin cream 1 g intravaginal twice a week 3. Return in 2 months for follow-up

## 2016-02-05 ENCOUNTER — Other Ambulatory Visit: Payer: Self-pay | Admitting: Family Medicine

## 2016-02-05 MED ORDER — CHOLESTYRAMINE 4 G PO PACK: 4.0000 g | PACK | Freq: Two times a day (BID) | ORAL | Status: DC

## 2016-02-05 NOTE — Telephone Encounter (Signed)
Patient would like refill for CHOLESTYRAMINE PO sent to [facility].

## 2016-02-05 NOTE — Telephone Encounter (Signed)
Done-aa 

## 2016-02-10 ENCOUNTER — Ambulatory Visit (INDEPENDENT_AMBULATORY_CARE_PROVIDER_SITE_OTHER): Payer: PPO | Admitting: Family Medicine

## 2016-02-10 VITALS — BP 114/52 | HR 84 | Temp 98.5°F | Resp 16 | Wt 176.0 lb

## 2016-02-10 DIAGNOSIS — R0609 Other forms of dyspnea: Secondary | ICD-10-CM

## 2016-02-10 DIAGNOSIS — T1490XA Injury, unspecified, initial encounter: Secondary | ICD-10-CM

## 2016-02-10 DIAGNOSIS — T1490 Injury, unspecified: Secondary | ICD-10-CM | POA: Diagnosis not present

## 2016-02-10 DIAGNOSIS — R55 Syncope and collapse: Secondary | ICD-10-CM | POA: Diagnosis not present

## 2016-02-10 DIAGNOSIS — W19XXXA Unspecified fall, initial encounter: Secondary | ICD-10-CM | POA: Diagnosis not present

## 2016-02-10 DIAGNOSIS — M1990 Unspecified osteoarthritis, unspecified site: Secondary | ICD-10-CM

## 2016-02-10 DIAGNOSIS — R51 Headache: Secondary | ICD-10-CM

## 2016-02-10 DIAGNOSIS — R519 Headache, unspecified: Secondary | ICD-10-CM

## 2016-02-10 NOTE — Progress Notes (Signed)
Patient ID: Melissa [name], female   DOB: 08/01/1946, 70 y.o.   MRN: [id number]   Melissa [name]  MRN: [id number] DOB: 01-23-1946  Subjective:  HPI   1. Arthritis The patient is a 70 year old female who presents for follow up of her arthritis.  She was seen on 11/11/15 and found to have arthritic changes bilaterally of the hands.  She states has episodes of pain requiring treatment about two time weekly.  She states she is using Tylenol and gets relief when she take it.  2. Fall, initial encounter The patient also mentioned that the beginning of February she fell outside when she was taking the trash out.  She states that she feels she passed for a very brief period of time.  She may have turned her ankle causing the fall but she does not really think this is what happened.  The patient described the fall as follows; she was walking on the side walk, she felt like she was spinning to the side (which is what made her think she may have turned her ankle) and the next thing she remembers is, she saw she was about to hit the ground and remembered thinking "this is going to hurt".  She reports that she did not do anything to try and prevent the fall, or put her hands out to save her from falling on her face.  She is unaware of how long she was on the ground but, states it was long enough to make her husband think should have been back in the house.  While in the office she called her husband and he states she was gone from the house for at most 5 minutes.  She was unable to get up from the ground and when asked why, she stated she could not really feel her body.  Her husband told her she needed to get up because it was cold and she said 'no it wasn't', because she wasn't feeling anything.  Her husband got a blanket and asked if he needed to call EMS and states she told him she just needed to take some time.  She states that after about 10 minutes she was able to get up.  When she got up she did  not have any pain in her ankle but her nose, face and right shoulder hurt.  She states she fell on her face hitting her nose and may have hit her shoulder on the sidewalk.  She still has facial and nose pain, but her shoulder seems to be healing.   Patient Active Problem List   Diagnosis Date Noted  . Lichen sclerosus [id number]  . Vulvitis 11/22/2015  . Atrophic vaginitis 04/11/2015  . CA of rectum ([place]) 04/11/2015  . Chronic diarrhea 04/11/2015  . CD (contact dermatitis) 04/11/2015  . Affective disorder, major ([place]) 04/11/2015  . Bloodgood disease 04/11/2015  . Fibrositis 04/11/2015  . Acid reflux 04/11/2015  . Acquired hypothyroidism 04/11/2015  . Hypersomnia 04/11/2015  . Internal hemorrhoids 04/11/2015  . Muscle ache 04/11/2015  . Neuralgia neuritis, sciatic nerve 04/11/2015  . Herpes zoster 04/11/2015  . Apnea, sleep 04/11/2015  . 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 Inflammation of urethra 04/11/2015  . 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 Fibromyalgia   . Diffuse cystic mastopathy   . Migraines   . Other nonspecific finding on examination of urine   . ANKLE PAIN, RIGHT 10/01/2009  . 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  ANKLE SPRAIN, RIGHT 10/01/2009  . HYPERLIPIDEMIA 11/22/2007  . OSTEOARTHRITIS 11/22/2007  . PLANTAR FASCIITIS, BILATERAL 11/22/2007  . COLON CANCER, HX OF 11/22/2007    Past Medical History  Diagnosis Date  . Fibromyalgia 2006  . Hemorrhoids 2006  . Diffuse cystic mastopathy 12/30/12  . Migraines 2006  . Carcinoma in situ of anal canal [id number]  . Other nonspecific finding on examination of urine [id number]  . Fibroid   . Vulvitis 11/22/2015  . Cancer ([place]) 01/16/2004    anorectum chemo/ rad    Social History   Social History  . Marital Status: Married    Spouse Name: N/A  . Number of Children: N/A  . Years of Education: N/A   Occupational History  . Not on file.   Social History Main Topics  . Smoking status:  Never Smoker   . Smokeless tobacco: Never Used  . Alcohol Use: No  . Drug Use: No  . Sexual Activity: Not Currently    Birth Control/ Protection: Surgical   Other Topics Concern  . Not on file   Social History Narrative    Outpatient Prescriptions Prior to Visit  Medication Sig Dispense Refill  . cholecalciferol (VITAMIN D) 1000 units tablet Take 1,000 Units by mouth daily.    . cholestyramine (QUESTRAN) 4 g packet Take 1 packet (4 g total) by mouth 2 (two) times daily. 60 each 12  . clobetasol ointment (TEMOVATE) AB-123456789 % Apply 1 application topically 2 (two) times daily. Twice a day for 14 days then daily for 4 weeks 30 g 3  . diphenhydrAMINE (BENADRYL) 25 MG tablet Take 25 mg by mouth every 6 (six) hours as needed for itching.    . levothyroxine (SYNTHROID, LEVOTHROID) 175 MCG tablet Take by mouth.    . loperamide (IMODIUM) 2 MG capsule Take 2 mg by mouth 4 (four) times daily as needed for diarrhea or loose stools.    . Multiple Vitamins-Minerals (MULTIVITAL) tablet Take 1 tablet by mouth daily.    . nabumetone (RELAFEN) 500 MG tablet Take 500 mg by mouth daily.    . sertraline (ZOLOFT) 100 MG tablet Take 100 mg by mouth 2 (two) times daily.      No facility-administered medications prior to visit.    Allergies  Allergen Reactions  . Pentazocine Lactate   . Propoxyphene Hcl   . Propoxyphene N-Acetaminophen   . Tramadol Hcl   . Tramadol Hcl   . Talwin  [Pentazocine]   . Ciprofloxacin Rash  . Ciprofloxacin Hcl Rash  . Dicloxacillin Rash  . Dicloxacillin Sodium Rash  . Darvon  [Propoxyphene] Rash    Hallucinations Hallucinations.  . Etodolac Rash  . Iodine Rash    Review of Systems  Constitutional: Negative for fever, chills and malaise/fatigue.  HENT:       She has pain in and round the orbit bones and states it feels like it is back in her sinuses  Eyes: Negative for blurred vision, double vision and pain.  Respiratory: Positive for shortness of breath (chronic but  unchanged). Negative for wheezing. Cough: At night.   Cardiovascular: Positive for chest pain (Patient states she has had chest pain on and off for over a year.) and leg swelling. Negative for palpitations and orthopnea.  Gastrointestinal: Negative.   Musculoskeletal: Positive for myalgias, back pain, joint pain, falls and neck pain.  Neurological: Positive for headaches (Only when when she gets up in the morning, lasting about an hour or so). Negative for weakness.  Endo/Heme/Allergies:  Negative.   Psychiatric/Behavioral: Negative.    Objective:  BP 114/52 mmHg  Pulse 84  Temp(Src) 98.5 F (36.9 C) (Oral)  Resp 16  Wt 176 lb (79.833 kg)  Physical Exam  Constitutional: She is oriented to person, place, and time and well-developed, well-nourished, and in no distress.  HENT:  Head: Normocephalic and atraumatic.  Right Ear: External ear normal.  Left Ear: External ear normal.  Nose: Nose normal.  Mouth/Throat: Oropharynx is clear and moist.  Tender over the maxillary sinuses.   Eyes: Conjunctivae and EOM are normal. Pupils are equal, round, and reactive to light.  Neck: Normal range of motion. Neck supple.  Cardiovascular: Normal rate, regular rhythm and normal heart sounds.   Pulmonary/Chest: Effort normal and breath sounds normal.  Musculoskeletal: She exhibits edema (Left is trace, right is 1+).  Neurological: She is alert and oriented to person, place, and time. Gait normal.  Milta Deiters nerves intact, extraocular motion intact  Skin: Skin is warm and dry.  Psychiatric: Mood, memory, affect and judgment normal.    Assessment and Plan :   1. Arthritis   2. Fall, initial encounter   3. Syncope, unspecified syncope type  - EKG 12-Lead - Ambulatory referral to Cardiology - COMPLETE METABOLIC PANEL WITH GFR - CBC With Differential/Platelet - TSH  4. Facial pain  - Ambulatory referral to ENT  5. Trauma  - Ambulatory referral to ENT  6. DOE (dyspnea on exertion)  -  Ambulatory referral to Cardiology 7. Syncope Second episode. First episode was about 10 years ago. Cardiology referral first. He also need neurology referral. Cardiac etiology much more likely. I have done the exam and reviewed the above chart and it is accurate to the best of my knowledge.   Miguel Aschoff MD [facility] 02/10/2016 1:40 PM

## 2016-02-11 LAB — CBC WITH DIFFERENTIAL/PLATELET
BASOS: 1 %
Basophils Absolute: 0.1 10*3/uL (ref 0.0–0.2)
EOS (ABSOLUTE): 0.1 10*3/uL (ref 0.0–0.4)
EOS: 3 %
HEMATOCRIT: 37.5 % (ref 34.0–46.6)
Hemoglobin: 12.5 g/dL (ref 11.1–15.9)
IMMATURE GRANULOCYTES: 0 %
Immature Grans (Abs): 0 10*3/uL (ref 0.0–0.1)
LYMPHS: 39 %
Lymphocytes Absolute: 1.9 10*3/uL (ref 0.7–3.1)
MCH: 32.1 pg (ref 26.6–33.0)
MCHC: 33.3 g/dL (ref 31.5–35.7)
MCV: 96 fL (ref 79–97)
MONOCYTES: 10 %
Monocytes Absolute: 0.5 10*3/uL (ref 0.1–0.9)
NEUTROS PCT: 47 %
Neutrophils Absolute: 2.3 10*3/uL (ref 1.4–7.0)
PLATELETS: 257 10*3/uL (ref 150–379)
RBC: 3.9 x10E6/uL (ref 3.77–5.28)
RDW: 14.9 % (ref 12.3–15.4)
WBC: 4.8 10*3/uL (ref 3.4–10.8)

## 2016-02-11 LAB — COMPREHENSIVE METABOLIC PANEL
ALK PHOS: 89 IU/L (ref 39–117)
ALT: 25 IU/L (ref 0–32)
AST: 20 IU/L (ref 0–40)
Albumin/Globulin Ratio: 2 (ref 1.1–2.5)
Albumin: 4.3 g/dL (ref 3.5–4.8)
BUN/Creatinine Ratio: 21 (ref 11–26)
BUN: 16 mg/dL (ref 8–27)
Bilirubin Total: <0.2 mg/dL (ref 0.0–1.2)
CALCIUM: 9.3 mg/dL (ref 8.7–10.3)
CO2: 21 mmol/L (ref 18–29)
CREATININE: 0.75 mg/dL (ref 0.57–1.00)
Chloride: 103 mmol/L (ref 96–106)
GFR calc Af Amer: 93 mL/min/{1.73_m2} (ref >59)
GFR calc non Af Amer: 81 mL/min/{1.73_m2} (ref >59)
GLOBULIN, TOTAL: 2.2 g/dL (ref 1.5–4.5)
Glucose: 99 mg/dL (ref 65–99)
POTASSIUM: 4.2 mmol/L (ref 3.5–5.2)
SODIUM: 143 mmol/L (ref 134–144)
Total Protein: 6.5 g/dL (ref 6.0–8.5)

## 2016-02-11 LAB — TSH: TSH: 5.41 u[IU]/mL — ABNORMAL HIGH (ref 0.450–4.500)

## 2016-02-20 ENCOUNTER — Encounter: Payer: Self-pay | Admitting: Family Medicine

## 2016-02-20 ENCOUNTER — Other Ambulatory Visit: Payer: Self-pay

## 2016-02-20 MED ORDER — NABUMETONE 500 MG PO TABS: 500.0000 mg | ORAL_TABLET | Freq: Two times a day (BID) | ORAL | Status: DC | PRN

## 2016-02-20 NOTE — Telephone Encounter (Signed)
Refill request from patient through my chart message. To refill nabumetone. Please review-aa

## 2016-02-25 DIAGNOSIS — R55 Syncope and collapse: Secondary | ICD-10-CM | POA: Diagnosis not present

## 2016-03-02 DIAGNOSIS — R55 Syncope and collapse: Secondary | ICD-10-CM | POA: Diagnosis not present

## 2016-03-04 ENCOUNTER — Other Ambulatory Visit: Payer: Self-pay | Admitting: Otolaryngology

## 2016-03-04 DIAGNOSIS — R51 Headache: Secondary | ICD-10-CM | POA: Diagnosis not present

## 2016-03-04 DIAGNOSIS — M26609 Unspecified temporomandibular joint disorder, unspecified side: Secondary | ICD-10-CM | POA: Diagnosis not present

## 2016-03-04 DIAGNOSIS — G501 Atypical facial pain: Secondary | ICD-10-CM

## 2016-03-09 ENCOUNTER — Other Ambulatory Visit: Payer: Self-pay

## 2016-03-09 MED ORDER — NABUMETONE 500 MG PO TABS: 500.0000 mg | ORAL_TABLET | Freq: Two times a day (BID) | ORAL | Status: DC | PRN

## 2016-03-09 MED ORDER — CHOLESTYRAMINE 4 G PO PACK: 4.0000 g | PACK | Freq: Two times a day (BID) | ORAL | Status: DC

## 2016-03-18 DIAGNOSIS — R55 Syncope and collapse: Secondary | ICD-10-CM | POA: Diagnosis not present

## 2016-03-19 DIAGNOSIS — R55 Syncope and collapse: Secondary | ICD-10-CM | POA: Diagnosis not present

## 2016-03-23 DIAGNOSIS — E7800 Pure hypercholesterolemia, unspecified: Secondary | ICD-10-CM | POA: Diagnosis not present

## 2016-03-23 DIAGNOSIS — R55 Syncope and collapse: Secondary | ICD-10-CM | POA: Diagnosis not present

## 2016-03-24 ENCOUNTER — Ambulatory Visit (INDEPENDENT_AMBULATORY_CARE_PROVIDER_SITE_OTHER): Payer: PPO | Admitting: Obstetrics and Gynecology

## 2016-03-24 ENCOUNTER — Encounter: Payer: Self-pay | Admitting: Obstetrics and Gynecology

## 2016-03-24 VITALS — BP 115/66 | HR 92 | Ht 62.0 in | Wt 172.8 lb

## 2016-03-24 DIAGNOSIS — L900 Lichen sclerosus et atrophicus: Secondary | ICD-10-CM

## 2016-03-24 DIAGNOSIS — K626 Ulcer of anus and rectum: Secondary | ICD-10-CM | POA: Diagnosis not present

## 2016-03-24 DIAGNOSIS — Z85048 Personal history of other malignant neoplasm of rectum, rectosigmoid junction, and anus: Secondary | ICD-10-CM

## 2016-03-24 DIAGNOSIS — N952 Postmenopausal atrophic vaginitis: Secondary | ICD-10-CM | POA: Diagnosis not present

## 2016-03-24 NOTE — Progress Notes (Signed)
Chief complaint: 1. Lichen sclerosus 2. History of anal cancer, status post resection, status post chemotherapy, status post x-ray therapy 12 years ago  Patient presents for 2 month follow-up on vulvar lichen sclerosus. Biopsy verified in 11/2015. Most recent therapy: Status post Temovate ointment daily for 4 weeks then twice a week for 4 weeks. Symptoms: No vulvar itching or burning. Patient has noted intense anal irritation with Premarin cream subsequently discontinued intravaginal Premarin cream because of the symptoms.  OBJECTIVE: BP 115/66 mmHg  Pulse 92  Ht 5\' 2"  (1.575 m)  Wt 172 lb 12.8 oz (78.382 kg)  BMI 31.60 kg/m2 Pelvic exam: External Genitalia: Moderate atrophy; agglutination of labia minora to the labia majora; right-sided faint leukoplakia/hypopigmentation on right labia majora; posterior fourchette with faint white epithelium/leukoplakia; Right labia majora biopsy and posterior fourchette Biopsies healing, are now re-epithelialized. No evidence of infection, induration or drainage. Vagina: Severe atrophy BUS: Normal Rectum: Ulceration at 3:00 with adjacent leukoplakia  ASSESSMENT: 1. Lichen sclerosus, stable on maintenance Temovate therapy 2. History of anal cancer, status post resection, status post chemotherapy, status post XRT 12 years ago; current exam notable for anal ulcer with adjacent leukoplakia 3. Severe vaginal atrophy  PLAN: 1. New Temovate ointment 0.05% twice a week to vulva 2. Trial of Estrace cream intravaginal twice a week (Estrace carrier vehicle May not be as irritating as Premarin carrier vehicle) 3. Referral to [name] for follow-up on history of anal cancer  Brayton Mars, MD  Note: This dictation was prepared with Dragon dictation along with smaller phrase technology. Any transcriptional errors that result from this process are unintentional.

## 2016-03-24 NOTE — Patient Instructions (Signed)
1. Continue Temovate ointment 0.05% twice a week to the vulva 2. Return in 3 months for follow-up 3. Estrace cream intravaginal twice a week 4. Referral to [name] for follow-upon  anal cancer

## 2016-03-25 MED ORDER — ESTRADIOL 0.1 MG/GM VA CREA: 1.0000 | TOPICAL_CREAM | VAGINAL | Status: DC

## 2016-03-25 NOTE — Addendum Note (Signed)
Addended by: Elouise Munroe on: 03/25/2016 09:15 AM   Modules accepted: Orders

## 2016-03-30 NOTE — Addendum Note (Signed)
Addended by: Elouise Munroe on: 03/30/2016 10:47 AM   Modules accepted: Orders

## 2016-03-31 ENCOUNTER — Ambulatory Visit: Payer: PPO

## 2016-03-31 ENCOUNTER — Encounter: Payer: Self-pay | Admitting: General Surgery

## 2016-03-31 ENCOUNTER — Ambulatory Visit (INDEPENDENT_AMBULATORY_CARE_PROVIDER_SITE_OTHER): Payer: PPO | Admitting: General Surgery

## 2016-03-31 VITALS — BP 142/78 | HR 68 | Resp 12 | Ht 63.0 in | Wt 173.0 lb

## 2016-03-31 DIAGNOSIS — Z86008 Personal history of in-situ neoplasm of other site: Secondary | ICD-10-CM | POA: Diagnosis not present

## 2016-03-31 DIAGNOSIS — K626 Ulcer of anus and rectum: Secondary | ICD-10-CM | POA: Diagnosis not present

## 2016-03-31 DIAGNOSIS — Z86004 Personal history of in-situ neoplasm of other and unspecified digestive organs: Secondary | ICD-10-CM

## 2016-03-31 NOTE — Progress Notes (Signed)
Patient ID: Enzlee Ursua, female   DOB: 1946-11-12, 70 y.o.   MRN: [id number]  Chief Complaint  Patient presents with  . Rectal Problems    HPI Mercadez Ashley is a 70 y.o. female.  Here today for anal ulcer evaluation seen by Dr Aretta Nip. She states she had noticed it about 6 months ago. She states it is tender and has some bleeding as well. She states the premarin cream she used in past seemed to make it worse, she is now on Estrace cream She is using Vaseline after a BM. Bowels are regular. I have reviewed the history of present illness with the patient.  HPI  Past Medical History  Diagnosis Date  . Fibromyalgia 2006  . Hemorrhoids 2006  . Diffuse cystic mastopathy 12/30/12  . Migraines 2006  . Carcinoma in situ of anal canal [id number]  . Other nonspecific finding on examination of urine [id number]  . Fibroid   . Vulvitis 11/22/2015  . Cancer ([place]) 01/16/2004    anorectum chemo/ rad    Past Surgical History  Procedure Laterality Date  . Colonoscopy  2008, 2013  . Tonsillectomy  2005  . Anal mass exc  2014  . Breast surgery  2005    BREAST REDUCTION SX Breast Biopsy (left) Neg for cancer  . Cosmetic surgery  20008    FACIAL  . Vaginal hysterectomy  1986    Secondary to [place]  . Reduction mammaplasty Bilateral   . Breast biopsy Bilateral     neg    Family History  Problem Relation Age of Onset  . Diabetes Father   . Cancer Father   . Stroke Sister   . Heart disease Maternal Grandmother   . Breast cancer Paternal Aunt   . Ovarian cancer Neg Hx     Social History Social History  Substance Use Topics  . Smoking status: Never Smoker   . Smokeless tobacco: Never Used  . Alcohol Use: No    Allergies  Allergen Reactions  . Pentazocine Lactate   . Propoxyphene Hcl   . Propoxyphene N-Acetaminophen   . Tramadol Hcl   . Tramadol Hcl   . Talwin  [Pentazocine]   . Ciprofloxacin Rash  . Ciprofloxacin Hcl Rash  . Dicloxacillin Rash  .  Dicloxacillin Sodium Rash  . Darvon  [Propoxyphene] Rash    Hallucinations Hallucinations.  . Etodolac Rash  . Iodine Rash    Current Outpatient Prescriptions  Medication Sig Dispense Refill  . cholecalciferol (VITAMIN D) 1000 units tablet Take 1,000 Units by mouth daily.    . cholestyramine (QUESTRAN) 4 g packet Take 1 packet (4 g total) by mouth 2 (two) times daily. 180 each 3  . clobetasol ointment (TEMOVATE) AB-123456789 % Apply 1 application topically 2 (two) times daily. Twice a day for 14 days then daily for 4 weeks 30 g 3  . diphenhydrAMINE (BENADRYL) 25 MG tablet Take 25 mg by mouth every 6 (six) hours as needed for itching.    . estradiol (ESTRACE VAGINAL) 0.1 MG/GM vaginal cream Place 1 Applicatorful vaginally 2 (two) times a week. 42.5 g 12  . levothyroxine (SYNTHROID, LEVOTHROID) 175 MCG tablet Take by mouth.    . loperamide (IMODIUM) 2 MG capsule Take 2 mg by mouth 4 (four) times daily as needed for diarrhea or loose stools.    . Multiple Vitamins-Minerals (MULTIVITAL) tablet Take 1 tablet by mouth daily.    . nabumetone (RELAFEN) 500 MG tablet Take 1 tablet (500 mg total) by  mouth 2 (two) times daily as needed. 180 tablet 1  . sertraline (ZOLOFT) 100 MG tablet Take 100 mg by mouth 2 (two) times daily.      No current facility-administered medications for this visit.    Review of Systems Review of Systems  Blood pressure 142/78, pulse 68, resp. rate 12, height 5\' 3"  (1.6 m), weight 173 lb (78.472 kg).  Physical Exam Physical Exam  Constitutional: She is oriented to person, place, and time. She appears well-developed and well-nourished.  Genitourinary:  Irregular shaped 1.5 cm x 1 cm superficial clean base ulcer. Located posterior to anal orifice at 5 ocl  This is similar to prior ulcer which was biopsied-negative for malignancy  Neurological: She is alert and oriented to person, place, and time.  Skin: Skin is warm and dry.  Psychiatric: Her behavior is normal.    Data  Reviewed Prior notes.  Assessment    Peri anal ulcer.     Plan    Anoscopy with biopsy. Fleets enema one hour prior to visit.     PCP:  Cranford Mon, Richard L This information has been scribed by Karie Fetch RN, BSN,BC.   Tonee Silverstein G 03/31/2016, 7:23 PM

## 2016-03-31 NOTE — Patient Instructions (Addendum)
The patient is aware to call back for any questions or concerns. Fleets enema one hour prior to visit.

## 2016-04-06 HISTORY — PX: OTHER SURGICAL HISTORY: SHX169

## 2016-04-14 ENCOUNTER — Encounter: Payer: Self-pay | Admitting: General Surgery

## 2016-04-14 ENCOUNTER — Ambulatory Visit (INDEPENDENT_AMBULATORY_CARE_PROVIDER_SITE_OTHER): Payer: PPO | Admitting: General Surgery

## 2016-04-14 VITALS — BP 150/78 | HR 78 | Resp 12 | Ht 62.0 in | Wt 173.0 lb

## 2016-04-14 DIAGNOSIS — K626 Ulcer of anus and rectum: Secondary | ICD-10-CM

## 2016-04-14 DIAGNOSIS — Z86008 Personal history of in-situ neoplasm of other site: Secondary | ICD-10-CM | POA: Diagnosis not present

## 2016-04-14 DIAGNOSIS — Z86004 Personal history of in-situ neoplasm of other and unspecified digestive organs: Secondary | ICD-10-CM

## 2016-04-14 NOTE — Patient Instructions (Signed)
The patient is aware to call back for any questions or concerns.  

## 2016-04-14 NOTE — Progress Notes (Signed)
Here today for anoscopy and biopsy anal ulcer. She did her fleets enema this morning. I have reviewed the history of present illness with the patient. Anosocopy was performed first. The internal mucosa was a bit inflamed but is typical for this patient with prior radiation. No internal ulcer was visualized.   External anal ulcer was visualized and punch biopsy was performed with local anesthetic- 72ml of 1% xylocaine with epi. Two specimen collected.  Patient to follow up depending on pathology report.    PCP:  Cranford Mon, Richard This information has been scribed by Karie Fetch RN, BSN,BC.

## 2016-04-16 ENCOUNTER — Telehealth: Payer: Self-pay | Admitting: *Deleted

## 2016-04-16 NOTE — Telephone Encounter (Signed)
Notified patient as instructed, patient pleased. Discussed follow-up appointments, patient agrees  

## 2016-04-16 NOTE — Telephone Encounter (Signed)
-----   Message from Christene Lye, MD sent at 04/15/2016  3:55 PM EDT ----- Biopsy showed no atypia.  Rosann Auerbach, please let pt know. Follow up as scheduled and prn

## 2016-05-30 ENCOUNTER — Encounter: Payer: Self-pay | Admitting: Family Medicine

## 2016-06-01 MED ORDER — NABUMETONE 500 MG PO TABS: 500.0000 mg | ORAL_TABLET | Freq: Two times a day (BID) | ORAL | Status: DC | PRN

## 2016-06-10 ENCOUNTER — Telehealth: Payer: Self-pay | Admitting: Family Medicine

## 2016-06-10 ENCOUNTER — Encounter: Payer: Self-pay | Admitting: *Deleted

## 2016-06-10 NOTE — Telephone Encounter (Signed)
This encounter was created in error - please disregard.

## 2016-06-10 NOTE — Telephone Encounter (Signed)
Pt is requesting a call back from [place].  [id number]

## 2016-06-15 ENCOUNTER — Ambulatory Visit (INDEPENDENT_AMBULATORY_CARE_PROVIDER_SITE_OTHER): Payer: PPO | Admitting: General Surgery

## 2016-06-15 ENCOUNTER — Encounter: Payer: Self-pay | Admitting: General Surgery

## 2016-06-15 VITALS — BP 118/76 | HR 78 | Resp 13 | Ht 60.0 in | Wt 173.0 lb

## 2016-06-15 DIAGNOSIS — Z86004 Personal history of in-situ neoplasm of other and unspecified digestive organs: Secondary | ICD-10-CM

## 2016-06-15 DIAGNOSIS — K625 Hemorrhage of anus and rectum: Secondary | ICD-10-CM

## 2016-06-15 DIAGNOSIS — Z86008 Personal history of in-situ neoplasm of other site: Secondary | ICD-10-CM

## 2016-06-15 LAB — POC HEMOCCULT BLD/STL (OFFICE/1-CARD/DIAGNOSTIC)

## 2016-06-15 MED ORDER — POLYETHYLENE GLYCOL 3350 17 GM/SCOOP PO POWD: ORAL | Status: DC

## 2016-06-15 NOTE — Progress Notes (Signed)
Patient ID: Melissa [name], female   DOB: [birth year], 70 y.o.   MRN: [id number]  Chief Complaint  Patient presents with  . Other    rectal bleeding    HPI Melissa [name] is a 70 y.o. female with a history of anal canal carcinoma here for assessment of increased rectal bleeding. She reports that this started about 3-4 weeks ago. She denies any abdominal pain or change in bowel habits. She feels like this may by internal as apposed to external hemorrhoids.  I have reviewed the history of present illness with the patient.  HPI  Past Medical History  Diagnosis Date  . Fibromyalgia 2006  . Hemorrhoids 2006  . Diffuse cystic mastopathy 12/30/12  . Migraines 2006  . Carcinoma in situ of anal canal [id number]  . Other nonspecific finding on examination of urine [id number]  . Fibroid   . Vulvitis 11/22/2015  . Cancer ([place]) 01/16/2004    anorectum chemo/ rad    Past Surgical History  Procedure Laterality Date  . Colonoscopy  2008, 2013  . Tonsillectomy  2005  . Anal mass exc  2014  . Breast surgery  2005    BREAST REDUCTION SX Breast Biopsy (left) Neg for cancer  . Cosmetic surgery  20008    FACIAL  . Vaginal hysterectomy  1986    Secondary to [place]  . Reduction mammaplasty Bilateral   . Breast biopsy Bilateral     neg    Family History  Problem Relation Age of Onset  . Diabetes Father   . Cancer Father   . Stroke Sister   . Heart disease Maternal Grandmother   . Breast cancer Paternal Aunt   . Ovarian cancer Neg Hx     Social History Social History  Substance Use Topics  . Smoking status: Never Smoker   . Smokeless tobacco: Never Used  . Alcohol Use: No    Allergies  Allergen Reactions  . Pentazocine Lactate   . Propoxyphene Hcl   . Propoxyphene N-Acetaminophen   . Tramadol Hcl   . Tramadol Hcl   . Talwin  [Pentazocine]   . Ciprofloxacin Rash  . Ciprofloxacin Hcl Rash  . Dicloxacillin Rash  . Dicloxacillin Sodium Rash  . Darvon  [Propoxyphene]  Rash    Hallucinations Hallucinations.  . Etodolac Rash  . Iodine Rash    Current Outpatient Prescriptions  Medication Sig Dispense Refill  . cholecalciferol (VITAMIN D) 1000 units tablet Take 1,000 Units by mouth daily.    . cholestyramine (QUESTRAN) 4 g packet Take 1 packet (4 g total) by mouth 2 (two) times daily. 180 each 3  . clobetasol ointment (TEMOVATE) AB-123456789 % Apply 1 application topically 2 (two) times daily. Twice a day for 14 days then daily for 4 weeks 30 g 3  . diphenhydrAMINE (BENADRYL) 25 MG tablet Take 25 mg by mouth every 6 (six) hours as needed for itching.    . estradiol (ESTRACE VAGINAL) 0.1 MG/GM vaginal cream Place 1 Applicatorful vaginally 2 (two) times a week. 42.5 g 12  . levothyroxine (SYNTHROID, LEVOTHROID) 175 MCG tablet Take by mouth.    . loperamide (IMODIUM) 2 MG capsule Take 2 mg by mouth 4 (four) times daily as needed for diarrhea or loose stools.    . Multiple Vitamins-Minerals (MULTIVITAL) tablet Take 1 tablet by mouth daily.    . nabumetone (RELAFEN) 500 MG tablet Take 1 tablet (500 mg total) by mouth 2 (two) times daily as needed. 180 tablet 1  .  sertraline (ZOLOFT) 100 MG tablet Take 100 mg by mouth 2 (two) times daily.     . polyethylene glycol powder (GLYCOLAX/MIRALAX) powder 255 grams one bottle for colonoscopy prep 255 g 0   No current facility-administered medications for this visit.    Review of Systems Review of Systems  Constitutional: Negative.   Respiratory: Negative.   Cardiovascular: Negative.   Gastrointestinal: Positive for anal bleeding. Negative for nausea, vomiting, abdominal pain, diarrhea, constipation, blood in stool, abdominal distention and rectal pain.    Blood pressure 118/76, pulse 78, resp. rate 13, height 5' (1.524 m), weight 173 lb (78.472 kg).  Physical Exam Physical Exam  Constitutional: She is oriented to person, place, and time. She appears well-developed and well-nourished.  Eyes: Conjunctivae are normal. No  scleral icterus.  Neck: Neck supple.  Cardiovascular: Normal rate, regular rhythm and normal heart sounds.   Pulmonary/Chest: Effort normal and breath sounds normal.  Abdominal: Soft. Normal appearance and bowel sounds are normal. There is no hepatosplenomegaly. There is tenderness in the right upper quadrant.  Genitourinary: Rectum normal. Guaiac negative stool.  Lymphadenopathy:    She has no cervical adenopathy.  Neurological: She is alert and oriented to person, place, and time.  Skin: Skin is warm and dry.  Psychiatric: She has a normal mood and affect.  Rectal exam- superficial 1.5cm ulceration-unchanged from before. Prior biopsy of this was negative for recurrent CA. Not bleeding. No blood in rectum Stool heme neg Data Reviewed   Assessment    No apparent source of bleeding on rectal exam. She is due for colonoscopy next yr but in view of the history of bleeding will proceed with colonoscopy now. Pt agreeable.    Plan    Patient has been scheduled for a colonoscopy on 06-24-16 at [facility].     PCP:  Cranford Mon, Richard L This information has been scribed by Gaspar Cola CMA.     SANKAR,SEEPLAPUTHUR G 06/18/2016, 2:22 PM

## 2016-06-15 NOTE — Patient Instructions (Signed)
Colonoscopy A colonoscopy is an exam to look at the entire large intestine (colon). This exam can help find problems such as tumors, polyps, inflammation, and areas of bleeding. The exam takes about 1 hour.  LET YOUR HEALTH CARE PROVIDER KNOW ABOUT:   Any allergies you have.  All medicines you are taking, including vitamins, herbs, eye drops, creams, and over-the-counter medicines.  Previous problems you or members of your family have had with the use of anesthetics.  Any blood disorders you have.  Previous surgeries you have had.  Medical conditions you have. RISKS AND COMPLICATIONS  Generally, this is a safe procedure. However, as with any procedure, complications can occur. Possible complications include:  Bleeding.  Tearing or rupture of the colon wall.  Reaction to medicines given during the exam.  Infection (rare). BEFORE THE PROCEDURE   Ask your health care provider about changing or stopping your regular medicines.  You may be prescribed an oral bowel prep. This involves drinking a large amount of medicated liquid, starting the day before your procedure. The liquid will cause you to have multiple loose stools until your stool is almost clear or light green. This cleans out your colon in preparation for the procedure.  Do not eat or drink anything else once you have started the bowel prep, unless your health care provider tells you it is safe to do so.  Arrange for someone to drive you home after the procedure. PROCEDURE   You will be given medicine to help you relax (sedative).  You will lie on your side with your knees bent.  A long, flexible tube with a light and camera on the end (colonoscope) will be inserted through the rectum and into the colon. The camera sends video back to a computer screen as it moves through the colon. The colonoscope also releases carbon dioxide gas to inflate the colon. This helps your health care provider see the area better.  During  the exam, your health care provider may take a small tissue sample (biopsy) to be examined under a microscope if any abnormalities are found.  The exam is finished when the entire colon has been viewed. AFTER THE PROCEDURE   Do not drive for 24 hours after the exam.  You may have a small amount of blood in your stool.  You may pass moderate amounts of gas and have mild abdominal cramping or bloating. This is caused by the gas used to inflate your colon during the exam.  Ask when your test results will be ready and how you will get your results. Make sure you get your test results.   This information is not intended to replace advice given to you by your health care provider. Make sure you discuss any questions you have with your health care provider.   Document Released: 11/20/2000 Document Revised: 09/13/2013 Document Reviewed: 07/31/2013 Elsevier Interactive Patient Education 2016 Elsevier Inc.  

## 2016-06-18 ENCOUNTER — Encounter: Payer: Self-pay | Admitting: General Surgery

## 2016-06-22 ENCOUNTER — Telehealth: Payer: Self-pay | Admitting: *Deleted

## 2016-06-22 NOTE — Telephone Encounter (Signed)
Per [name], we will need to reschedule patient's colonoscopy due to possible UTI. Patient instructed she will need to follow up with her gynecologist. She verbalizes understanding.   Patient's colonoscopy has been rescheduled from 06-24-16 to 07-08-16 at [facility].   Scheduling form faxed to the Endoscopy Department regarding date change.

## 2016-06-22 NOTE — Telephone Encounter (Signed)
Patient called the office today to report that she thinks she has a UTI. She is complaining of burning after she urinates.   This patient was encouraged to follow up with PCP to see if this is indeed a UTI. Patient states she already has an appointment with her gynecologist tomorrow.   Patient wants to know if this will interfere with colonoscopy that is scheduled for this Wednesday, 06-24-16 at [facility].   Best number to reach patient is the home number and ok to leave a message.

## 2016-06-23 ENCOUNTER — Telehealth: Payer: Self-pay | Admitting: Obstetrics and Gynecology

## 2016-06-23 ENCOUNTER — Encounter: Payer: Self-pay | Admitting: Obstetrics and Gynecology

## 2016-06-23 ENCOUNTER — Ambulatory Visit (INDEPENDENT_AMBULATORY_CARE_PROVIDER_SITE_OTHER): Payer: PPO | Admitting: Obstetrics and Gynecology

## 2016-06-23 VITALS — BP 118/68 | HR 89 | Ht 60.0 in | Wt 173.8 lb

## 2016-06-23 DIAGNOSIS — Z85048 Personal history of other malignant neoplasm of rectum, rectosigmoid junction, and anus: Secondary | ICD-10-CM | POA: Diagnosis not present

## 2016-06-23 DIAGNOSIS — N952 Postmenopausal atrophic vaginitis: Secondary | ICD-10-CM

## 2016-06-23 DIAGNOSIS — L900 Lichen sclerosus et atrophicus: Secondary | ICD-10-CM | POA: Diagnosis not present

## 2016-06-23 DIAGNOSIS — R300 Dysuria: Secondary | ICD-10-CM | POA: Diagnosis not present

## 2016-06-23 LAB — POCT URINALYSIS DIPSTICK
BILIRUBIN UA: 1
Glucose, UA: NEGATIVE
KETONES UA: NEGATIVE
Nitrite, UA: NEGATIVE
PH UA: 6
Spec Grav, UA: 1.02
Urobilinogen, UA: 0.2

## 2016-06-23 MED ORDER — NITROFURANTOIN MONOHYD MACRO 100 MG PO CAPS: 100.0000 mg | ORAL_CAPSULE | Freq: Two times a day (BID) | ORAL | Status: DC

## 2016-06-23 NOTE — Patient Instructions (Signed)
1. Macrobid 1 pill twice a day for 7 days for bladder infection 2. Continue with the Temovate ointment twice a week topically on the vulva as directed 3. Continue with Estrace cream intravaginal twice a week 4. Return in 6 months for follow-up

## 2016-06-23 NOTE — Addendum Note (Signed)
Addended by: Malachi Paradise on: 06/23/2016 01:58 PM   Modules accepted: Orders, SmartSet

## 2016-06-23 NOTE — Progress Notes (Signed)
Chief complaint: 1. Lichen sclerosus 2. Vaginal atrophy 3. History of anal cancer 4. Burning with urination  Patient presents for 3 month follow-up. She is not having any significant vulvar burning or itching. She is using the Temovate ointment twice a week and also the Estrace cream once weekly intravaginal. The patient has been experiencing some rectal bleeding and she has been scheduled for colonoscopy by Dr.; in the near future. His biopsies recently of the anal ulceration were negative for atypia.  Past medical history, past surgical history, problem list, medications, and allergies are reviewed  OBJECTIVE: BP 118/68 mmHg  Pulse 89  Ht 5' (1.524 m)  Wt 173 lb 12.8 oz (78.835 kg)  BMI 33.94 kg/m2 Pelvic exam: External Genitalia: Moderate atrophy; agglutination of labia minora to the labia majora; right-sided faint leukoplakia/hypopigmentation on right labia majora; posterior fourchette with faint white epithelium/leukoplakia; No evidence of infection, induration or drainage. Vagina: Severe atrophy BUS: Normal Rectum: No ulceration seen today  ASSESSMENT: 1. Lichen sclerosus, stable 2. Vaginal atrophy, stable 3. Suspected UTI 4. History of anal cancer, stable with recent biopsies negative  PLAN: 1. Continue Temovate ointment twice weekly to vulva 2. Continue Estrace cream intravaginal twice weekly 3. Urinalysis with culture is obtained 4. Macrobid 1 tablet twice a day for 7 days 5. Colonoscopy as scheduled.  A total of 15 minutes were spent face-to-face with the patient during this encounter and over half of that time dealt with counseling and coordination of care.  Brayton Mars, MD  Note: This dictation was prepared with Dragon dictation along with smaller phrase technology. Any transcriptional errors that result from this process are unintentional.

## 2016-06-23 NOTE — Telephone Encounter (Signed)
Pt called and stated that the medication that dr de prescribed to her today, it needs to be authorized and she stated she would like a call back.

## 2016-06-24 ENCOUNTER — Telehealth: Payer: Self-pay | Admitting: Obstetrics and Gynecology

## 2016-06-24 NOTE — Telephone Encounter (Signed)
See previous phone call. Pt has her rx.

## 2016-06-24 NOTE — Telephone Encounter (Signed)
PT CALLED AND SHE HAS HER RX SHE WANTED TO LET YOU KNOW SO YOU DON'T HAVE TO CALL THE PHARMACIST.

## 2016-06-24 NOTE — Telephone Encounter (Signed)
Pt states she was self pay for macrobid. She has picked up her rx.

## 2016-06-25 LAB — URINE CULTURE

## 2016-07-02 ENCOUNTER — Telehealth: Payer: Self-pay | Admitting: Obstetrics and Gynecology

## 2016-07-02 NOTE — Telephone Encounter (Signed)
Pt called and left message on office vm stating she would like for you to call her back.

## 2016-07-02 NOTE — Telephone Encounter (Signed)
Pts states her sx are better but still some burning after urination. Pt will come by tomorrow before 11:30 and drop off urine. Will do u/a and culture.

## 2016-07-03 ENCOUNTER — Other Ambulatory Visit (INDEPENDENT_AMBULATORY_CARE_PROVIDER_SITE_OTHER): Payer: PPO

## 2016-07-03 ENCOUNTER — Other Ambulatory Visit: Payer: PPO

## 2016-07-03 ENCOUNTER — Other Ambulatory Visit: Payer: Self-pay | Admitting: Obstetrics and Gynecology

## 2016-07-03 ENCOUNTER — Other Ambulatory Visit: Payer: Self-pay

## 2016-07-03 DIAGNOSIS — R300 Dysuria: Secondary | ICD-10-CM

## 2016-07-03 LAB — POCT URINALYSIS DIPSTICK
BILIRUBIN UA: NEGATIVE
Glucose, UA: NEGATIVE
KETONES UA: NEGATIVE
Nitrite, UA: NEGATIVE
PH UA: 6
Protein, UA: NEGATIVE
RBC UA: NEGATIVE
SPEC GRAV UA: 1.02
Urobilinogen, UA: 0.2

## 2016-07-03 NOTE — Progress Notes (Signed)
Pt is s/p uti and completion of ATB. She states she still has burning. U/a and culture done. Pt will call back Monday for results.

## 2016-07-05 LAB — URINE CULTURE

## 2016-07-06 ENCOUNTER — Telehealth: Payer: Self-pay | Admitting: Obstetrics and Gynecology

## 2016-07-06 NOTE — Telephone Encounter (Signed)
Pt notified of urine culture negative. (MUF <10,000 colonies). Pt states she was looking at the wrong results on My Chart.

## 2016-07-06 NOTE — Telephone Encounter (Signed)
Pt called and said she was still having symptoms of UTI and Crystal Miller told her to call if she had symptoms. She did urine sample Friday. Please call pt

## 2016-07-07 ENCOUNTER — Encounter: Payer: Self-pay | Admitting: *Deleted

## 2016-07-08 ENCOUNTER — Encounter
Admission: RE | Disposition: A | Discharge status: Home / Self Care | Payer: Self-pay | Source: Ambulatory Visit | Attending: General Surgery

## 2016-07-08 ENCOUNTER — Ambulatory Visit
Admission: RE | Admit: 2016-07-08 | Discharge: 2016-07-08 | Disposition: A | Discharge status: Home / Self Care | Payer: PPO | Source: Ambulatory Visit | Attending: General Surgery | Admitting: General Surgery

## 2016-07-08 ENCOUNTER — Ambulatory Visit: Payer: PPO | Admitting: Anesthesiology

## 2016-07-08 ENCOUNTER — Encounter: Payer: Self-pay | Admitting: *Deleted

## 2016-07-08 DIAGNOSIS — K626 Ulcer of anus and rectum: Secondary | ICD-10-CM | POA: Insufficient documentation

## 2016-07-08 DIAGNOSIS — K625 Hemorrhage of anus and rectum: Secondary | ICD-10-CM | POA: Insufficient documentation

## 2016-07-08 DIAGNOSIS — D123 Benign neoplasm of transverse colon: Secondary | ICD-10-CM | POA: Diagnosis not present

## 2016-07-08 DIAGNOSIS — L89899 Pressure ulcer of other site, unspecified stage: Secondary | ICD-10-CM | POA: Diagnosis not present

## 2016-07-08 DIAGNOSIS — F329 Major depressive disorder, single episode, unspecified: Secondary | ICD-10-CM | POA: Diagnosis not present

## 2016-07-08 DIAGNOSIS — K635 Polyp of colon: Secondary | ICD-10-CM | POA: Diagnosis not present

## 2016-07-08 DIAGNOSIS — Z885 Allergy status to narcotic agent status: Secondary | ICD-10-CM | POA: Diagnosis not present

## 2016-07-08 DIAGNOSIS — E039 Hypothyroidism, unspecified: Secondary | ICD-10-CM | POA: Diagnosis not present

## 2016-07-08 DIAGNOSIS — K219 Gastro-esophageal reflux disease without esophagitis: Secondary | ICD-10-CM | POA: Diagnosis not present

## 2016-07-08 DIAGNOSIS — Z1211 Encounter for screening for malignant neoplasm of colon: Secondary | ICD-10-CM | POA: Diagnosis not present

## 2016-07-08 DIAGNOSIS — M797 Fibromyalgia: Secondary | ICD-10-CM | POA: Insufficient documentation

## 2016-07-08 DIAGNOSIS — Z79899 Other long term (current) drug therapy: Secondary | ICD-10-CM | POA: Diagnosis not present

## 2016-07-08 DIAGNOSIS — Z85048 Personal history of other malignant neoplasm of rectum, rectosigmoid junction, and anus: Secondary | ICD-10-CM | POA: Diagnosis not present

## 2016-07-08 DIAGNOSIS — G4730 Sleep apnea, unspecified: Secondary | ICD-10-CM | POA: Diagnosis not present

## 2016-07-08 DIAGNOSIS — D126 Benign neoplasm of colon, unspecified: Secondary | ICD-10-CM

## 2016-07-08 HISTORY — DX: Benign neoplasm of colon, unspecified: D12.6

## 2016-07-08 HISTORY — DX: Depression, unspecified: F32.A

## 2016-07-08 HISTORY — PX: COLONOSCOPY WITH PROPOFOL: SHX5780

## 2016-07-08 HISTORY — DX: Hypothyroidism, unspecified: E03.9

## 2016-07-08 HISTORY — DX: Major depressive disorder, single episode, unspecified: F32.9

## 2016-07-08 SURGERY — COLONOSCOPY WITH PROPOFOL
Anesthesia: General

## 2016-07-08 MED ORDER — PROPOFOL 10 MG/ML IV BOLUS
INTRAVENOUS | Status: DC | PRN
Start: 2016-07-08 — End: 2016-07-08
  Administered 2016-07-08: 20 mg via INTRAVENOUS
  Administered 2016-07-08: 30 mg via INTRAVENOUS

## 2016-07-08 MED ORDER — PROPOFOL 500 MG/50ML IV EMUL
INTRAVENOUS | Status: DC | PRN
  Administered 2016-07-08: 120 ug/kg/min via INTRAVENOUS

## 2016-07-08 MED ORDER — FENTANYL CITRATE (PF) 100 MCG/2ML IJ SOLN
INTRAMUSCULAR | Status: DC | PRN
  Administered 2016-07-08: 50 ug via INTRAVENOUS

## 2016-07-08 MED ORDER — SODIUM CHLORIDE 0.9 % IV SOLN
INTRAVENOUS | Status: DC
  Administered 2016-07-08: 08:00:00 via INTRAVENOUS

## 2016-07-08 NOTE — Transfer of Care (Signed)
Immediate Anesthesia Transfer of Care Note  Patient: Melissa [name]  Procedure(s) Performed: Procedure(s): COLONOSCOPY WITH PROPOFOL (N/A)  Patient Location: PACU  Anesthesia Type:General  Level of Consciousness: awake  Airway & Oxygen Therapy: Patient Spontanous Breathing and Patient connected to nasal cannula oxygen  Post-op Assessment: Report given to RN and Post -op Vital signs reviewed and stable  Post vital signs: Reviewed  Last Vitals:  Vitals:   07/08/16 0727  BP: 120/62  Pulse: 86  Resp: 18  Temp: 36.6 C    Last Pain:  Vitals:   07/08/16 0727  TempSrc: Tympanic         Complications: No apparent anesthesia complications

## 2016-07-08 NOTE — Anesthesia Postprocedure Evaluation (Signed)
Anesthesia Post Note  Patient: Melissa [name]  Procedure(s) Performed: Procedure(s) (LRB): COLONOSCOPY WITH PROPOFOL (N/A)  Patient location during evaluation: PACU Anesthesia Type: General Level of consciousness: awake Pain management: pain level controlled Respiratory status: nonlabored ventilation Cardiovascular status: blood pressure returned to baseline Anesthetic complications: no    Last Vitals:  Vitals:   07/08/16 0727  BP: 120/62  Pulse: 86  Resp: 18  Temp: 36.6 C    Last Pain:  Vitals:   07/08/16 0727  TempSrc: Tympanic                 VAN STAVEREN,Melissa [name]

## 2016-07-08 NOTE — Op Note (Signed)
[facility] Gastroenterology Patient Name: Melissa [name] Procedure Date: 07/08/2016 8:13 AM MRN: [id number] Account #: [id number] Date of Birth: 27-Jun-1946 Admit Type: Outpatient Age: 70 Room: [facility] ENDO ROOM 1 Gender: Female Note Status: Finalized Procedure:            Colonoscopy Indications:          High risk colon cancer surveillance: Personal history                        of anal cancer, Incidental - Rectal bleeding Providers:            Seeplaputhur G. Jamal Collin, MD Referring MD:         Janine Ores. Rosanna Randy, MD (Referring MD) Medicines:            General Anesthesia Complications:        No immediate complications. Procedure:            Pre-Anesthesia Assessment:                       - General anesthesia under the supervision of an                        anesthesiologist was determined to be medically                        necessary for this procedure based on review of the                        patient's medical history, medications, and prior                        anesthesia history.                       After obtaining informed consent, the colonoscope was                        passed under direct vision. Throughout the procedure,                        the patient's blood pressure, pulse, and oxygen                        saturations were monitored continuously. The                        Colonoscope was introduced through the anus and                        advanced to the the cecum, identified by the ileocecal                        valve. The colonoscopy was performed without                        difficulty. The patient tolerated the procedure well.                        The quality of the bowel preparation was good. Findings:      The perianal exam was abnormal.      The  perianal exam findings include a perianal ulceration. This has been       previously biopsied-benign      A 5 mm polyp was found in the transverse colon. The polyp was  sessile.       Biopsies were taken with a cold forceps for histology.      The exam was otherwise without abnormality. Impression:           - Abnormal perianal exam.                       - Decubitus ulceration found on perianal exam.                       - One 5 mm polyp in the transverse colon. Biopsied.                       - The examination was otherwise normal. Recommendation:       - Discharge patient to home.                       - Repeat colonoscopy in 5 years for surveillance. Procedure Code(s):    --- Professional ---                       [phone], Colonoscopy, flexible; with biopsy, single or                        multiple Diagnosis Code(s):    --- Professional ---                       Z85.048, Personal history of other malignant neoplasm                        of rectum, rectosigmoid junction, and anus                       L89.899, Pressure ulcer of other site, unspecified stage                       D12.3, Benign neoplasm of transverse colon (hepatic                        flexure or splenic flexure) CPT copyright 2016 American Medical Association. All rights reserved. The codes documented in this report are preliminary and upon coder review may  be revised to meet current compliance requirements. Christene Lye, MD 07/08/2016 8:45:55 AM This report has been signed electronically. Number of Addenda: 0 Note Initiated On: 07/08/2016 8:13 AM Scope Withdrawal Time: 0 hours 8 minutes 10 seconds  Total Procedure Duration: 0 hours 20 minutes 37 seconds       [facility]

## 2016-07-08 NOTE — H&P (View-Only) (Signed)
Patient ID: Melissa [name], female   DOB: 10-16-1946, 70 y.o.   MRN: [id number]  Chief Complaint  Patient presents with  . Other    rectal bleeding    Melissa [name] is a 70 y.o. female with a history of anal canal carcinoma here for assessment of increased rectal bleeding. She reports that this started about 3-4 weeks ago. She denies any abdominal pain or change in bowel habits. She feels like this may by internal as apposed to external hemorrhoids.  I have reviewed the history of present illness with the patient.  Melissa  Past Medical History  Diagnosis Date  . Fibromyalgia 2006  . Hemorrhoids 2006  . Diffuse cystic mastopathy 12/30/12  . Migraines 2006  . Carcinoma in situ of anal canal [id number]  . Other nonspecific finding on examination of urine [id number]  . Fibroid   . Vulvitis 11/22/2015  . Cancer ([place]) 01/16/2004    anorectum chemo/ rad    Past Surgical History  Procedure Laterality Date  . Colonoscopy  2008, 2013  . Tonsillectomy  2005  . Anal mass exc  2014  . Breast surgery  2005    BREAST REDUCTION SX Breast Biopsy (left) Neg for cancer  . Cosmetic surgery  20008    FACIAL  . Vaginal hysterectomy  1986    Secondary to [place]  . Reduction mammaplasty Bilateral   . Breast biopsy Bilateral     neg    Family History  Problem Relation Age of Onset  . Diabetes Father   . Cancer Father   . Stroke Sister   . Heart disease Maternal Grandmother   . Breast cancer Paternal Aunt   . Ovarian cancer Neg Hx     Social History Social History  Substance Use Topics  . Smoking status: Never Smoker   . Smokeless tobacco: Never Used  . Alcohol Use: No    Allergies  Allergen Reactions  . Pentazocine Lactate   . Propoxyphene Hcl   . Propoxyphene N-Acetaminophen   . Tramadol Hcl   . Tramadol Hcl   . Talwin  [Pentazocine]   . Ciprofloxacin Rash  . Ciprofloxacin Hcl Rash  . Dicloxacillin Rash  . Dicloxacillin Sodium Rash  . Darvon  [Propoxyphene]  Rash    Hallucinations Hallucinations.  . Etodolac Rash  . Iodine Rash    Current Outpatient Prescriptions  Medication Sig Dispense Refill  . cholecalciferol (VITAMIN D) 1000 units tablet Take 1,000 Units by mouth daily.    . cholestyramine (QUESTRAN) 4 g packet Take 1 packet (4 g total) by mouth 2 (two) times daily. 180 each 3  . clobetasol ointment (TEMOVATE) [id number] % Apply 1 application topically 2 (two) times daily. Twice a day for 14 days then daily for 4 weeks 30 g 3  . diphenhydrAMINE (BENADRYL) 25 MG tablet Take 25 mg by mouth every 6 (six) hours as needed for itching.    . estradiol (ESTRACE VAGINAL) 0.1 MG/GM vaginal cream Place 1 Applicatorful vaginally 2 (two) times a week. 42.5 g 12  . levothyroxine (SYNTHROID, LEVOTHROID) 175 MCG tablet Take by mouth.    . loperamide (IMODIUM) 2 MG capsule Take 2 mg by mouth 4 (four) times daily as needed for diarrhea or loose stools.    . Multiple Vitamins-Minerals (MULTIVITAL) tablet Take 1 tablet by mouth daily.    . nabumetone (RELAFEN) 500 MG tablet Take 1 tablet (500 mg total) by mouth 2 (two) times daily as needed. 180 tablet 1  .  sertraline (ZOLOFT) 100 MG tablet Take 100 mg by mouth 2 (two) times daily.     . polyethylene glycol powder (GLYCOLAX/MIRALAX) powder 255 grams one bottle for colonoscopy prep 255 g 0   No current facility-administered medications for this visit.    Review of Systems Review of Systems  Constitutional: Negative.   Respiratory: Negative.   Cardiovascular: Negative.   Gastrointestinal: Positive for anal bleeding. Negative for nausea, vomiting, abdominal pain, diarrhea, constipation, blood in stool, abdominal distention and rectal pain.    Blood pressure 118/76, pulse 78, resp. rate 13, height 5' (1.524 m), weight 173 lb (78.472 kg).  Physical Exam Physical Exam  Constitutional: She is oriented to person, place, and time. She appears well-developed and well-nourished.  Eyes: Conjunctivae are normal. No  scleral icterus.  Neck: Neck supple.  Cardiovascular: Normal rate, regular rhythm and normal heart sounds.   Pulmonary/Chest: Effort normal and breath sounds normal.  Abdominal: Soft. Normal appearance and bowel sounds are normal. There is no hepatosplenomegaly. There is tenderness in the right upper quadrant.  Genitourinary: Rectum normal. Guaiac negative stool.  Lymphadenopathy:    She has no cervical adenopathy.  Neurological: She is alert and oriented to person, place, and time.  Skin: Skin is warm and dry.  Psychiatric: She has a normal mood and affect.  Rectal exam- superficial 1.5cm ulceration-unchanged from before. Prior biopsy of this was negative for recurrent CA. Not bleeding. No blood in rectum Stool heme neg Data Reviewed   Assessment    No apparent source of bleeding on rectal exam. She is due for colonoscopy next yr but in view of the history of bleeding will proceed with colonoscopy now. Pt agreeable.    Plan    Patient has been scheduled for a colonoscopy on 06-24-16 at [facility].     PCP:  Cranford Mon, Richard L This information has been scribed by Gaspar Cola CMA.     SANKAR,SEEPLAPUTHUR G 06/18/2016, 2:22 PM

## 2016-07-08 NOTE — Anesthesia Preprocedure Evaluation (Signed)
Anesthesia Evaluation  Patient identified by MRN, date of birth, ID band Patient awake    Reviewed: Allergy & Precautions, NPO status , Patient's Chart, lab work & pertinent test results  Airway Mallampati: II       Dental  (+) Teeth Intact   Pulmonary sleep apnea ,    breath sounds clear to auscultation       Cardiovascular Exercise Tolerance: Good  Rhythm:Regular Rate:Normal     Neuro/Psych  Headaches, Depression    GI/Hepatic Neg liver ROS, GERD  Medicated,  Endo/Other  Hypothyroidism   Renal/GU      Musculoskeletal   Abdominal Normal abdominal exam  (+)   Peds  Hematology   Anesthesia Other Findings   Reproductive/Obstetrics                             Anesthesia Physical Anesthesia Plan  ASA: II  Anesthesia Plan: General   Post-op Pain Management:    Induction: Intravenous  Airway Management Planned: Natural Airway and Nasal Cannula  Additional Equipment:   Intra-op Plan:   Post-operative Plan:   Informed Consent: I have reviewed the patients History and Physical, chart, labs and discussed the procedure including the risks, benefits and alternatives for the proposed anesthesia with the patient or authorized representative who has indicated his/her understanding and acceptance.     Plan Discussed with: CRNA  Anesthesia Plan Comments:         Anesthesia Quick Evaluation

## 2016-07-08 NOTE — Interval H&P Note (Signed)
History and Physical Interval Note:  07/08/2016 8:04 AM  Melissa [name]  has presented today for surgery, with the diagnosis of RECTAL BLEED HX ANAL CA  The various methods of treatment have been discussed with the patient and family. After consideration of risks, benefits and other options for treatment, the patient has consented to  Procedure(s): COLONOSCOPY WITH PROPOFOL (N/A) as a surgical intervention .  The patient's history has been reviewed, patient examined, no change in status, stable for surgery.  I have reviewed the patient's chart and labs.  Questions were answered to the patient's satisfaction.     Ashtynn Berke G

## 2016-07-09 ENCOUNTER — Encounter: Payer: Self-pay | Admitting: General Surgery

## 2016-07-09 LAB — SURGICAL PATHOLOGY

## 2016-07-10 ENCOUNTER — Telehealth: Payer: Self-pay | Admitting: *Deleted

## 2016-07-10 NOTE — Telephone Encounter (Signed)
Notified patient as instructed, patient pleased. Discussed follow-up appointments, patient agrees  

## 2016-07-10 NOTE — Telephone Encounter (Signed)
-----   Message from Christene Lye, MD sent at 07/10/2016  7:06 AM EDT ----- Rosann Auerbach, please let pt pt know the pathology showed a benign adenoma.

## 2016-07-22 ENCOUNTER — Ambulatory Visit (INDEPENDENT_AMBULATORY_CARE_PROVIDER_SITE_OTHER): Payer: PPO | Admitting: Family Medicine

## 2016-07-22 VITALS — BP 118/72 | HR 84 | Temp 98.9°F | Resp 12 | Wt 173.0 lb

## 2016-07-22 DIAGNOSIS — R300 Dysuria: Secondary | ICD-10-CM | POA: Diagnosis not present

## 2016-07-22 LAB — POCT UA - MICROSCOPIC ONLY
Bacteria, U Microscopic: NEGATIVE
CASTS, UR, LPF, POC: NEGATIVE
Crystals, Ur, HPF, POC: NEGATIVE
Epithelial cells, urine per micros: NEGATIVE
Mucus, UA: NEGATIVE
RBC, urine, microscopic: NEGATIVE
YEAST UA: NEGATIVE

## 2016-07-22 LAB — POCT URINALYSIS DIPSTICK
Bilirubin, UA: NEGATIVE
Glucose, UA: NEGATIVE
KETONES UA: NEGATIVE
Nitrite, UA: NEGATIVE
PROTEIN UA: NEGATIVE
RBC UA: NEGATIVE
SPEC GRAV UA: 1.015
UROBILINOGEN UA: 0.2
pH, UA: 6.5

## 2016-07-22 MED ORDER — SULFAMETHOXAZOLE-TRIMETHOPRIM 800-160 MG PO TABS: 1.0000 | ORAL_TABLET | Freq: Two times a day (BID) | ORAL | 0 refills | Status: DC

## 2016-07-22 NOTE — Progress Notes (Signed)
Subjective:  HPI  Patient was seen by Dr Kathee Polite about 3 weeks ago and treated with Macrobid for UTI, on the re check UA urine was clear. She did not feel like her symptoms were completely resolved. About 1 week ago started to have painful urination, urgency, had some nausea, left side of her back felt achy.  Prior to Admission medications   Medication Sig Start Date End Date Taking? Authorizing Provider  cholecalciferol (VITAMIN D) 1000 units tablet Take 1,000 Units by mouth daily.   Yes Historical Provider, MD  cholestyramine (QUESTRAN) 4 g packet Take 1 packet (4 g total) by mouth 2 (two) times daily. 03/09/16  Yes Caswell Alvillar Maceo Pro., MD  clobetasol ointment (TEMOVATE) [id number] % Apply 1 application topically 2 (two) times daily. Twice a day for 14 days then daily for 4 weeks 12/12/15  Yes Alanda Slim Defrancesco, MD  diphenhydrAMINE (BENADRYL) 25 MG tablet Take 25 mg by mouth every 6 (six) hours as needed for itching.   Yes Historical Provider, MD  estradiol (ESTRACE VAGINAL) 0.1 MG/GM vaginal cream Place 1 Applicatorful vaginally 2 (two) times a week. 03/25/16  Yes Alanda Slim Defrancesco, MD  levothyroxine (SYNTHROID, LEVOTHROID) 175 MCG tablet Take by mouth. 01/01/14  Yes Historical Provider, MD  loperamide (IMODIUM) 2 MG capsule Take 2 mg by mouth 4 (four) times daily as needed for diarrhea or loose stools.   Yes Historical Provider, MD  Multiple Vitamins-Minerals (MULTIVITAL) tablet Take 1 tablet by mouth daily.   Yes Historical Provider, MD  nabumetone (RELAFEN) 500 MG tablet Take 1 tablet (500 mg total) by mouth 2 (two) times daily as needed. 06/01/16  Yes Raquel Racey Maceo Pro., MD  nitrofurantoin, macrocrystal-monohydrate, (MACROBID) 100 MG capsule Take 1 capsule (100 mg total) by mouth 2 (two) times daily. 06/23/16  Yes Alanda Slim Defrancesco, MD  polyethylene glycol powder (GLYCOLAX/MIRALAX) powder 255 grams one bottle for colonoscopy prep 06/15/16  Yes Seeplaputhur Robinette Haines, MD  sertraline  (ZOLOFT) 100 MG tablet Take 100 mg by mouth 2 (two) times daily.    Yes Historical Provider, MD    Patient Active Problem List   Diagnosis Date Noted  . Lichen sclerosus [id number]  . Vulvitis 11/22/2015  . Atrophic vaginitis 04/11/2015  . CA of rectum ([place]) 04/11/2015  . Chronic diarrhea 04/11/2015  . CD (contact dermatitis) 04/11/2015  . Affective disorder, major ([place]) 04/11/2015  . Bloodgood disease 04/11/2015  . Fibrositis 04/11/2015  . Acid reflux 04/11/2015  . Acquired hypothyroidism 04/11/2015  . Hypersomnia 04/11/2015  . Internal hemorrhoids 04/11/2015  . Muscle ache 04/11/2015  . Neuralgia neuritis, sciatic nerve 04/11/2015  . Herpes zoster 04/11/2015  . Apnea, sleep 04/11/2015  . 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 Inflammation of urethra 04/11/2015  . 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 Fibromyalgia   . Diffuse cystic mastopathy   . Migraines   . Other nonspecific finding on examination of urine   . ANKLE PAIN, RIGHT 10/01/2009  . 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 ANKLE SPRAIN, RIGHT 10/01/2009  . HYPERLIPIDEMIA 11/22/2007  . OSTEOARTHRITIS 11/22/2007  . PLANTAR FASCIITIS, BILATERAL 11/22/2007  . COLON CANCER, HX OF 11/22/2007    Past Medical History:  Diagnosis Date  . Cancer ([place]) 01/16/2004   anorectum chemo/ rad  . Carcinoma in situ of anal canal [id number]  . Depression   . Diffuse cystic mastopathy 12/30/12  . Fibroid   . Fibromyalgia 2006  . Hemorrhoids 2006  .  Hypothyroidism   . Migraines 2006  . Other nonspecific finding on examination of urine [id number]  . Vulvitis 11/22/2015    Social History   Social History  . Marital status: Married    Spouse name: N/A  . Number of children: N/A  . Years of education: N/A   Occupational History  . Not on file.   Social History Main Topics  . Smoking status: Never Smoker  . Smokeless tobacco: Never Used  . Alcohol use No  . Drug use: No  . Sexual activity: Not  Currently    Birth control/ protection: Surgical   Other Topics Concern  . Not on file   Social History Narrative  . No narrative on file    Allergies  Allergen Reactions  . Pentazocine Lactate   . Propoxyphene Hcl   . Propoxyphene N-Acetaminophen   . Talwin  [Pentazocine]   . Tramadol Hcl   . Tramadol Hcl   . Ciprofloxacin Rash  . Ciprofloxacin Hcl Rash  . Darvon  [Propoxyphene] Rash    Hallucinations Hallucinations.  . Dicloxacillin Rash  . Dicloxacillin Sodium Rash  . Etodolac Rash    Review of Systems  Constitutional: Positive for malaise/fatigue.  HENT: Negative.   Eyes: Negative.   Respiratory: Negative.   Cardiovascular: Negative.   Gastrointestinal: Positive for nausea.  Genitourinary: Positive for dysuria and urgency.  Musculoskeletal: Positive for back pain.  Skin: Negative.   Neurological: Negative.   Endo/Heme/Allergies: Negative.   Psychiatric/Behavioral: Negative.     Immunization History  Administered Date(s) Administered  . Influenza, High Dose Seasonal PF 09/04/2015  . Influenza-Unspecified 10/07/2013, 08/07/2014  . Pneumococcal Conjugate-13 10/25/2014  . Pneumococcal Polysaccharide-23 04/09/2006, 12/10/2011  . Tdap 09/16/2011   Objective:  BP 118/72   Pulse 84   Temp 98.9 F (37.2 C)   Resp 12   Wt 173 lb (78.5 kg)   BMI 30.65 kg/m   Physical Exam  Constitutional: She is oriented to person, place, and time and well-developed, well-nourished, and in no distress.  HENT:  Head: Normocephalic and atraumatic.  Right Ear: External ear normal.  Left Ear: External ear normal.  Nose: Nose normal.  Eyes: Conjunctivae are normal. Pupils are equal, round, and reactive to light.  Neck: Normal range of motion. Neck supple.  Cardiovascular: Normal rate, regular rhythm, normal heart sounds and intact distal pulses.   No murmur heard. Pulmonary/Chest: Effort normal and breath sounds normal. No respiratory distress. She has no wheezes.    Abdominal: Soft. She exhibits no distension. There is no tenderness. There is no rebound and no guarding.  Neurological: She is alert and oriented to person, place, and time.  Skin: Skin is warm and dry.  Psychiatric: Mood, memory, affect and judgment normal.    Lab Results  Component Value Date   WBC 4.8 02/10/2016   HGB 12.6 10/31/2013   HCT 37.5 02/10/2016   PLT 257 02/10/2016   GLUCOSE 99 02/10/2016   CHOL 234 (H) 05/21/2015   TRIG 292 (H) 05/21/2015   HDL 57 05/21/2015   LDLCALC 119 (H) 05/21/2015   TSH 5.410 (H) 02/10/2016    CMP     Component Value Date/Time   NA 143 02/10/2016 1539   NA 139 06/28/2013 1033   K 4.2 02/10/2016 1539   K 4.8 06/28/2013 1033   CL 103 02/10/2016 1539   CL 105 06/28/2013 1033   CO2 21 02/10/2016 1539   CO2 27 06/28/2013 1033   GLUCOSE 99 02/10/2016 1539  GLUCOSE 123 (H) 06/28/2013 1033   BUN 16 02/10/2016 1539   BUN 16 06/28/2013 1033   CREATININE 0.75 02/10/2016 1539   CREATININE 1.00 06/28/2013 1033   CALCIUM 9.3 02/10/2016 1539   CALCIUM 9.1 06/28/2013 1033   PROT 6.5 02/10/2016 1539   PROT 7.0 06/28/2013 1033   ALBUMIN 4.3 02/10/2016 1539   ALBUMIN 3.9 06/28/2013 1033   AST 20 02/10/2016 1539   AST 22 06/28/2013 1033   ALT 25 02/10/2016 1539   ALT 34 06/28/2013 1033   ALKPHOS 89 02/10/2016 1539   ALKPHOS 119 06/28/2013 1033   BILITOT <0.2 02/10/2016 1539   BILITOT 0.5 06/28/2013 1033   GFRNONAA 81 02/10/2016 1539   GFRNONAA 58 (L) 06/28/2013 1033   GFRAA 93 02/10/2016 1539   GFRAA >60 06/28/2013 1033    Assessment and Plan :  1. Dysuria Will treat with Septra DS and send for culture. Follow as needed. - POCT urinalysis dipstick - POCT UA - Microscopic Only - Urine Culture 2. History of anal cancer/squamous cell carcinoma 3. Postmenopausal status/atrophic vaginitis Patient on twice weekly Premarin vaginal cream. Patient was seen and examined by Dr. Eulas Post and note was scribed by Theressa Millard, RMA.   I have done the exam and reviewed the above chart and it is accurate to the best of my knowledge.  Miguel Aschoff MD [facility] 07/22/2016 4:03 PM

## 2016-07-30 ENCOUNTER — Other Ambulatory Visit: Payer: Self-pay | Admitting: Family Medicine

## 2016-07-30 DIAGNOSIS — R300 Dysuria: Secondary | ICD-10-CM

## 2016-07-30 NOTE — Telephone Encounter (Signed)
Pt was seen last for a UTI.  Pt states she is still having burning after voiding.  Pt is requesting another Rx to help with this.  [facility].  CB#[phone]/MW

## 2016-07-31 DIAGNOSIS — R300 Dysuria: Secondary | ICD-10-CM | POA: Diagnosis not present

## 2016-07-31 MED ORDER — SULFAMETHOXAZOLE-TRIMETHOPRIM 800-160 MG PO TABS: 1.0000 | ORAL_TABLET | Freq: Two times a day (BID) | ORAL | 0 refills | Status: DC

## 2016-07-31 NOTE — Telephone Encounter (Signed)
Patient advised. RX sent in and order for Urine Culture put in and patient will go by [facility] today and get that done=aa

## 2016-07-31 NOTE — Telephone Encounter (Signed)
Please review-aa 

## 2016-07-31 NOTE — Telephone Encounter (Signed)
Pt called back to see if she was going to be able to get another round of sulfamethoxazole-trimethoprim (BACTRIM DS) 800-160 MG tablet. Please advise. Thanks TNP

## 2016-07-31 NOTE — Telephone Encounter (Signed)
Ok to rf but urine C and S might also be helpful.

## 2016-08-01 LAB — URINE CULTURE: ORGANISM ID, BACTERIA: NO GROWTH

## 2016-08-19 ENCOUNTER — Encounter: Payer: Self-pay | Admitting: Family Medicine

## 2016-09-02 ENCOUNTER — Other Ambulatory Visit: Payer: Self-pay

## 2016-09-02 ENCOUNTER — Ambulatory Visit (INDEPENDENT_AMBULATORY_CARE_PROVIDER_SITE_OTHER): Payer: PPO | Admitting: Family Medicine

## 2016-09-02 DIAGNOSIS — Z23 Encounter for immunization: Secondary | ICD-10-CM

## 2016-09-02 MED ORDER — SERTRALINE HCL 100 MG PO TABS: 100.0000 mg | ORAL_TABLET | Freq: Two times a day (BID) | ORAL | 3 refills | Status: DC

## 2016-09-02 MED ORDER — LEVOTHYROXINE SODIUM 175 MCG PO TABS: 175.0000 ug | ORAL_TABLET | Freq: Every day | ORAL | 3 refills | Status: DC

## 2016-09-02 NOTE — Telephone Encounter (Signed)
Pt requested refills

## 2016-09-05 ENCOUNTER — Ambulatory Visit: Payer: PPO | Admitting: Family Medicine

## 2016-09-07 DIAGNOSIS — H04123 Dry eye syndrome of bilateral lacrimal glands: Secondary | ICD-10-CM | POA: Diagnosis not present

## 2016-09-07 DIAGNOSIS — H2513 Age-related nuclear cataract, bilateral: Secondary | ICD-10-CM | POA: Diagnosis not present

## 2016-09-07 DIAGNOSIS — H16223 Keratoconjunctivitis sicca, not specified as Sjogren's, bilateral: Secondary | ICD-10-CM | POA: Diagnosis not present

## 2016-10-22 ENCOUNTER — Other Ambulatory Visit: Payer: Self-pay

## 2016-10-22 DIAGNOSIS — Z1231 Encounter for screening mammogram for malignant neoplasm of breast: Secondary | ICD-10-CM

## 2016-12-07 ENCOUNTER — Other Ambulatory Visit: Payer: Self-pay | Admitting: Family Medicine

## 2016-12-08 NOTE — Telephone Encounter (Signed)
Dr Darnell Level Do you want to authroize this? Jiles Garter

## 2016-12-10 ENCOUNTER — Telehealth: Payer: Self-pay | Admitting: Family Medicine

## 2016-12-10 NOTE — Telephone Encounter (Signed)
Called Pt to schedule AWV with NHA - knb °

## 2016-12-22 ENCOUNTER — Ambulatory Visit
Admission: RE | Admit: 2016-12-22 | Discharge: 2016-12-22 | Disposition: A | Discharge status: Home / Self Care | Payer: PPO | Source: Ambulatory Visit | Attending: General Surgery | Admitting: General Surgery

## 2016-12-22 DIAGNOSIS — Z1231 Encounter for screening mammogram for malignant neoplasm of breast: Secondary | ICD-10-CM | POA: Diagnosis not present

## 2016-12-24 ENCOUNTER — Ambulatory Visit: Payer: PPO | Admitting: Obstetrics and Gynecology

## 2016-12-29 ENCOUNTER — Ambulatory Visit (INDEPENDENT_AMBULATORY_CARE_PROVIDER_SITE_OTHER): Payer: PPO | Admitting: General Surgery

## 2016-12-29 ENCOUNTER — Encounter: Payer: Self-pay | Admitting: General Surgery

## 2016-12-29 VITALS — BP 132/62 | HR 88 | Resp 12 | Ht 62.5 in | Wt 172.0 lb

## 2016-12-29 DIAGNOSIS — N6012 Diffuse cystic mastopathy of left breast: Secondary | ICD-10-CM

## 2016-12-29 DIAGNOSIS — N6011 Diffuse cystic mastopathy of right breast: Secondary | ICD-10-CM

## 2016-12-29 DIAGNOSIS — Z86008 Personal history of in-situ neoplasm of other site: Secondary | ICD-10-CM | POA: Diagnosis not present

## 2016-12-29 DIAGNOSIS — Z86004 Personal history of in-situ neoplasm of other and unspecified digestive organs: Secondary | ICD-10-CM

## 2016-12-29 DIAGNOSIS — K626 Ulcer of anus and rectum: Secondary | ICD-10-CM

## 2016-12-29 NOTE — Patient Instructions (Addendum)
The patient is aware to call back for any questions or concerns. Patient will be asked to return to the office in one year with a bilateral screening mammogram with Dr Bary Castilla.  Colonoscopy due 2022.

## 2016-12-29 NOTE — Progress Notes (Signed)
Patient ID: Melissa [name], female   DOB: January 11, 1946, 71 y.o.   MRN: [id number]  Chief Complaint  Patient presents with  . Follow-up    HPI Melissa [name] is a 71 y.o. female.  who presents for a breast evaluation. The most recent mammogram was done on 12-22-16. No new breast issues.  She also has a history of anal cancer in situ status post chemotherapy and radiation. Patient does perform regular self breast checks and gets regular mammograms done. No new breast issues.  Last colonoscopy was 2017. She does still have episodes of diarrhea at random times. I have reviewed the history of present illness with the patient.   HPI  Past Medical History:  Diagnosis Date  . Cancer ([place]) 01/16/2004   anorectum chemo/ rad  . Carcinoma in situ of anal canal [id number]  . Depression   . Diffuse cystic mastopathy 12/30/12  . Fibroid   . Fibromyalgia 2006  . Hemorrhoids 2006  . Hypothyroidism   . Migraines 2006  . Other nonspecific finding on examination of urine [id number]  . Tubular adenoma of colon 07/08/2016  . Vulvitis 11/22/2015    Past Surgical History:  Procedure Laterality Date  . ANAL MASS EXC  2014  . BREAST BIOPSY Bilateral    neg  . BREAST SURGERY  2005   BREAST REDUCTION SX Breast Biopsy (left) Neg for cancer  . COLONOSCOPY  2008, 2013  . COLONOSCOPY WITH PROPOFOL N/A 07/08/2016   tubular adenoma/ COLONOSCOPY WITH PROPOFOL;  Surgeon: Christene Lye, MD;  Location: [facility] ENDOSCOPY;  Service: Endoscopy;  Laterality: N/A;  . COSMETIC SURGERY  20008   FACIAL  . REDUCTION MAMMAPLASTY Bilateral   . TONSILLECTOMY  2005  . VAGINAL HYSTERECTOMY  1986   Secondary to [place]    Family History  Problem Relation Age of Onset  . Diabetes Father   . Cancer Father   . Stroke Sister   . Heart disease Maternal Grandmother   . Breast cancer Paternal Aunt   . Ovarian cancer Neg Hx     Social History Social History  Substance Use Topics  . Smoking status: Never  Smoker  . Smokeless tobacco: Never Used  . Alcohol use No    Allergies  Allergen Reactions  . Pentazocine Lactate   . Propoxyphene Hcl   . Propoxyphene N-Acetaminophen   . Talwin  [Pentazocine]   . Tramadol Hcl   . Tramadol Hcl   . Ciprofloxacin Rash  . Ciprofloxacin Hcl Rash  . Darvon  [Propoxyphene] Rash    Hallucinations Hallucinations.  . Dicloxacillin Rash  . Dicloxacillin Sodium Rash  . Etodolac Rash    Current Outpatient Prescriptions  Medication Sig Dispense Refill  . cholecalciferol (VITAMIN D) 1000 units tablet Take 1,000 Units by mouth daily.    . cholestyramine (QUESTRAN) 4 g packet Take 1 packet (4 g total) by mouth 2 (two) times daily. 180 each 3  . clobetasol ointment (TEMOVATE) AB-123456789 % Apply 1 application topically 2 (two) times daily. Twice a day for 14 days then daily for 4 weeks 30 g 3  . diphenhydrAMINE (BENADRYL) 25 MG tablet Take 25 mg by mouth every 6 (six) hours as needed for itching.    . estradiol (ESTRACE VAGINAL) 0.1 MG/GM vaginal cream Place 1 Applicatorful vaginally 2 (two) times a week. 42.5 g 12  . levothyroxine (SYNTHROID, LEVOTHROID) 175 MCG tablet Take 1 tablet (175 mcg total) by mouth daily before breakfast. 90 tablet 3  . loperamide (IMODIUM) 2  MG capsule Take 2 mg by mouth 4 (four) times daily as needed for diarrhea or loose stools.    . Multiple Vitamins-Minerals (MULTIVITAL) tablet Take 1 tablet by mouth daily.    . nabumetone (RELAFEN) 500 MG tablet TAKE 1 TABLET (500 MG TOTAL) BY MOUTH 2 (TWO) TIMES DAILY AS NEEDED. 180 tablet 1  . nitrofurantoin, macrocrystal-monohydrate, (MACROBID) 100 MG capsule Take 1 capsule (100 mg total) by mouth 2 (two) times daily. 14 capsule 1  . sertraline (ZOLOFT) 100 MG tablet Take 1 tablet (100 mg total) by mouth 2 (two) times daily. 180 tablet 3   No current facility-administered medications for this visit.     Review of Systems Review of Systems  Constitutional: Negative.   Respiratory: Negative.    Cardiovascular: Negative.   Gastrointestinal: Positive for diarrhea.    Blood pressure 132/62, pulse 88, resp. rate 12, height 5' 2.5" (1.588 m), weight 172 lb (78 kg).  Physical Exam Physical Exam  Constitutional: She is oriented to person, place, and time. She appears well-developed and well-nourished.  HENT:  Mouth/Throat: Oropharynx is clear and moist.  Eyes: Conjunctivae are normal. No scleral icterus.  Neck: Neck supple.  Cardiovascular: Normal rate, regular rhythm and normal heart sounds.   Pulmonary/Chest: Effort normal and breath sounds normal. Right breast exhibits no inverted nipple, no mass, no nipple discharge, no skin change and no tenderness. Left breast exhibits no inverted nipple, no mass, no nipple discharge, no skin change and no tenderness.  Abdominal: Soft. There is no tenderness.  Genitourinary:  Genitourinary Comments: Anal ulcer, unchanged.  Lymphadenopathy:    She has no cervical adenopathy.    She has no axillary adenopathy.  Neurological: She is alert and oriented to person, place, and time.  Skin: Skin is warm and dry.  Psychiatric: Her behavior is normal.    Data Reviewed Mammogram reviewed.  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  Colonoscopy done 07-08-16.    Plan    Follow up for anal ulcer in 6 months Patient will be asked to return to the office in one year with a bilateral screening mammogram with Dr Bary Castilla. Colonoscopy due 2022.     This information has been scribed by Karie Fetch RN, BSN,BC.   Jaskiran Pata G 12/31/2016, 2:17 PM

## 2017-01-05 ENCOUNTER — Ambulatory Visit (INDEPENDENT_AMBULATORY_CARE_PROVIDER_SITE_OTHER): Payer: PPO

## 2017-01-05 VITALS — BP 118/60 | HR 88 | Temp 98.7°F | Ht 63.0 in | Wt 174.0 lb

## 2017-01-05 DIAGNOSIS — Z0000 Encounter for general adult medical examination without abnormal findings: Secondary | ICD-10-CM | POA: Diagnosis not present

## 2017-01-05 DIAGNOSIS — Z1159 Encounter for screening for other viral diseases: Secondary | ICD-10-CM | POA: Diagnosis not present

## 2017-01-05 NOTE — Progress Notes (Signed)
Subjective:   Melissa [name] is a 71 y.o. female who presents for Medicare Annual (Subsequent) preventive examination.  Review of Systems:  N/A  Cardiac Risk Factors include: advanced age (>69men, >28 women);obesity (BMI >30kg/m2)     Objective:     Vitals: BP 118/60 (BP Location: Right Arm)   Pulse 88   Temp 98.7 F (37.1 C) (Oral)   Ht 5\' 3"  (1.6 m)   Wt 174 lb (78.9 kg)   BMI 30.82 kg/m   Body mass index is 30.82 kg/m.   Tobacco History  Smoking Status  . Never Smoker  Smokeless Tobacco  . Never Used     Counseling given: Not Answered   Past Medical History:  Diagnosis Date  . Cancer ([place]) 01/16/2004   anorectum chemo/ rad  . Carcinoma in situ of anal canal [id number]  . Depression   . Diffuse cystic mastopathy 12/30/12  . Fibroid   . Fibromyalgia 2006  . Hemorrhoids 2006  . Hypothyroidism   . Migraines 2006  . Other nonspecific finding on examination of urine [id number]  . Tubular adenoma of colon 07/08/2016  . Vulvitis 11/22/2015   Past Surgical History:  Procedure Laterality Date  . ANAL MASS EXC  2014  . BREAST BIOPSY Bilateral    neg  . BREAST SURGERY  2005   BREAST REDUCTION SX Breast Biopsy (left) Neg for cancer  . COLONOSCOPY  2008, 2013  . COLONOSCOPY WITH PROPOFOL N/A 07/08/2016   tubular adenoma/ COLONOSCOPY WITH PROPOFOL;  Surgeon: Christene Lye, MD;  Location: [facility] ENDOSCOPY;  Service: Endoscopy;  Laterality: N/A;  . COSMETIC SURGERY  20008   FACIAL  . REDUCTION MAMMAPLASTY Bilateral   . TONSILLECTOMY  2005  . VAGINAL HYSTERECTOMY  1986   Secondary to [place]   Family History  Problem Relation Age of Onset  . Diabetes Father   . Cancer Father   . Stroke Sister   . Heart disease Maternal Grandmother   . Breast cancer Paternal Aunt   . Ovarian cancer Neg Hx    History  Sexual Activity  . Sexual activity: Not Currently  . Birth control/ protection: Surgical    Outpatient Encounter Prescriptions as of 01/05/2017    Medication Sig  . cholecalciferol (VITAMIN D) 1000 units tablet Take 1,000 Units by mouth daily.  . cholestyramine (QUESTRAN) 4 g packet Take 1 packet (4 g total) by mouth 2 (two) times daily.  . clobetasol ointment (TEMOVATE) [id number] % Apply 1 application topically 2 (two) times daily. Twice a day for 14 days then daily for 4 weeks (Patient taking differently: Apply 1 application topically. Twice a day for 14 days then daily for 4 weeks)  . diphenhydrAMINE (BENADRYL) 25 MG tablet Take 25 mg by mouth every 6 (six) hours as needed for itching.  . estradiol (ESTRACE VAGINAL) 0.1 MG/GM vaginal cream Place 1 Applicatorful vaginally 2 (two) times a week.  . levothyroxine (SYNTHROID, LEVOTHROID) 175 MCG tablet Take 1 tablet (175 mcg total) by mouth daily before breakfast.  . loperamide (IMODIUM) 2 MG capsule Take 2 mg by mouth 4 (four) times daily as needed for diarrhea or loose stools.  . Magnesium 500 MG CAPS Take 1 capsule by mouth daily.  . Multiple Vitamins-Minerals (MULTIVITAL) tablet Take 1 tablet by mouth daily.  . nabumetone (RELAFEN) 500 MG tablet TAKE 1 TABLET (500 MG TOTAL) BY MOUTH 2 (TWO) TIMES DAILY AS NEEDED. (Patient taking differently: Take 500 mg by mouth 2 (two) times daily. )  .  sertraline (ZOLOFT) 100 MG tablet Take 1 tablet (100 mg total) by mouth 2 (two) times daily.  . nitrofurantoin, macrocrystal-monohydrate, (MACROBID) 100 MG capsule Take 1 capsule (100 mg total) by mouth 2 (two) times daily. (Patient not taking: Reported on 01/05/2017)   No facility-administered encounter medications on file as of 01/05/2017.     Activities of Daily Living In your present state of health, do you have any difficulty performing the following activities: 01/05/2017  Hearing? N  Vision? N  Difficulty concentrating or making decisions? N  Walking or climbing stairs? N  Dressing or bathing? N  Doing errands, shopping? N  Preparing Food and eating ? N  Using the Toilet? N  In the past six  months, have you accidently leaked urine? Y  Do you have problems with loss of bowel control? Y  Managing your Medications? N  Managing your Finances? N  Housekeeping or managing your Housekeeping? N  Some recent data might be hidden    Patient Care Team: Jerrol Banana., MD as PCP - General (Unknown Physician Specialty) Christene Lye, MD (General Surgery) Brayton Mars, MD as Consulting Physician (Obstetrics and Gynecology) Thelma Comp, OD as Consulting Physician (Optometry)    Assessment:     Exercise Activities and Dietary recommendations Current Exercise Habits: The patient does not participate in regular exercise at present (yardwork), Exercise limited by: Other - see comments (has Fibromyalgia)  Goals    . Increase water intake          Starting 01/05/17, I will continue to drink 6-8 glasses of water a day.      Fall Risk Fall Risk  01/05/2017 11/11/2015  Falls in the past year? No No   Depression Screen PHQ 2/9 Scores 01/05/2017 11/11/2015  PHQ - 2 Score 0 0     Cognitive Function     6CIT Screen 01/05/2017  What Year? 0 points  What month? 0 points  What time? 0 points  Count back from 20 0 points  Months in reverse 2 points  Repeat phrase 4 points  Total Score 6    Immunization History  Administered Date(s) Administered  . Influenza, High Dose Seasonal PF 09/04/2015, 09/02/2016  . Influenza-Unspecified 10/07/2013, 08/07/2014  . Pneumococcal Conjugate-13 10/25/2014  . Pneumococcal Polysaccharide-23 04/09/2006, 12/10/2011  . Tdap 09/16/2011   Screening Tests Health Maintenance  Topic Date Due  . MAMMOGRAM  12/22/2018  . TETANUS/TDAP  09/15/2021  . COLONOSCOPY  07/08/2026  . INFLUENZA VACCINE  Completed  . DEXA SCAN  Completed  . ZOSTAVAX  Completed  . Hepatitis C Screening  Completed  . PNA vac Low Risk Adult  Completed      Plan:  I have personally reviewed and addressed the Medicare Annual Wellness questionnaire and  have noted the following in the patient's chart:  A. Medical and social history B. Use of alcohol, tobacco or illicit drugs  C. Current medications and supplements D. Functional ability and status E.  Nutritional status F.  Physical activity G. Advance directives H. List of other physicians I.  Hospitalizations, surgeries, and ER visits in previous 12 months J.  [place] such as hearing and vision if needed, cognitive and depression L. Referrals and appointments - none  In addition, I have reviewed and discussed with patient certain preventive protocols, quality metrics, and best practice recommendations. A written personalized care plan for preventive services as well as general preventive health recommendations were provided to patient.  See attached scanned  questionnaire for additional information.   Signed,  Fabio Neighbors, LPN Nurse Health Advisor   MD Recommendations: None. I have reviewed the health advisors note, was  available for consultation and I agree with documentation and plan. Miguel Aschoff MD [facility]

## 2017-01-05 NOTE — Patient Instructions (Signed)
Health Maintenance, Female Introduction Adopting a healthy lifestyle and getting preventive care can go a long way to promote health and wellness. Talk with your health care provider about what schedule of regular examinations is right for you. This is a good chance for you to check in with your provider about disease prevention and staying healthy. In between checkups, there are plenty of things you can do on your own. Experts have done a lot of research about which lifestyle changes and preventive measures are most likely to keep you healthy. Ask your health care provider for more information. Weight and diet Eat a healthy diet  Be sure to include plenty of vegetables, fruits, low-fat dairy products, and lean protein.  Do not eat a lot of foods high in solid fats, added sugars, or salt.  Get regular exercise. This is one of the most important things you can do for your health.  Most adults should exercise for at least 150 minutes each week. The exercise should increase your heart rate and make you sweat (moderate-intensity exercise).  Most adults should also do strengthening exercises at least twice a week. This is in addition to the moderate-intensity exercise. Maintain a healthy weight  Body mass index (BMI) is a measurement that can be used to identify possible weight problems. It estimates body fat based on height and weight. Your health care provider can help determine your BMI and help you achieve or maintain a healthy weight.  For females 4 years of age and older:  A BMI below 18.5 is considered underweight.  A BMI of 18.5 to 24.9 is normal.  A BMI of 25 to 29.9 is considered overweight.  A BMI of 30 and above is considered obese. Watch levels of cholesterol and blood lipids  You should start having your blood tested for lipids and cholesterol at 72 years of age, then have this test every 5 years.  You may need to have your cholesterol levels checked more often  if:  Your lipid or cholesterol levels are high.  You are older than 72 years of age.  You are at high risk for heart disease. Cancer screening Lung Cancer  Lung cancer screening is recommended for adults 12-31 years old who are at high risk for lung cancer because of a history of smoking.  A yearly low-dose CT scan of the lungs is recommended for people who:  Currently smoke.  Have quit within the past 15 years.  Have at least a 30-pack-year history of smoking. A pack year is smoking an average of one pack of cigarettes a day for 1 year.  Yearly screening should continue until it has been 15 years since you quit.  Yearly screening should stop if you develop a health problem that would prevent you from having lung cancer treatment. Breast Cancer  Practice breast self-awareness. This means understanding how your breasts normally appear and feel.  It also means doing regular breast self-exams. Let your health care provider know about any changes, no matter how small.  If you are in your 20s or 30s, you should have a clinical breast exam (CBE) by a health care provider every 1-3 years as part of a regular health exam.  If you are 74 or older, have a CBE every year. Also consider having a breast X-ray (mammogram) every year.  If you have a family history of breast cancer, talk to your health care provider about genetic screening.  If you are at high risk for breast cancer,  talk to your health care provider about having an MRI and a mammogram every year.  Breast cancer gene (BRCA) assessment is recommended for women who have family members with BRCA-related cancers. BRCA-related cancers include:  Breast.  Ovarian.  Tubal.  Peritoneal cancers.  Results of the assessment will determine the need for genetic counseling and BRCA1 and BRCA2 testing. Colorectal Cancer  This type of cancer can be detected and often prevented.  Routine colorectal cancer screening usually begins  at 72 years of age and continues through 72 years of age.  Your health care provider may recommend screening at an earlier age if you have risk factors for colon cancer.  Your health care provider may also recommend using home test kits to check for hidden blood in the stool.  A small camera at the end of a tube can be used to examine your colon directly (sigmoidoscopy or colonoscopy). This is done to check for the earliest forms of colorectal cancer.  Routine screening usually begins at age 50.  Direct examination of the colon should be repeated every 5-10 years through 72 years of age. However, you may need to be screened more often if early forms of precancerous polyps or small growths are found. Skin Cancer  Check your skin from head to toe regularly.  Tell your health care provider about any new moles or changes in moles, especially if there is a change in a mole's shape or color.  Also tell your health care provider if you have a mole that is larger than the size of a pencil eraser.  Always use sunscreen. Apply sunscreen liberally and repeatedly throughout the day.  Protect yourself by wearing long sleeves, pants, a wide-brimmed hat, and sunglasses whenever you are outside. Heart disease, diabetes, and high blood pressure  High blood pressure causes heart disease and increases the risk of stroke. High blood pressure is more likely to develop in:  People who have blood pressure in the high end of the normal range (130-139/85-89 mm Hg).  People who are overweight or obese.  People who are African American.  If you are 18-39 years of age, have your blood pressure checked every 3-5 years. If you are 40 years of age or older, have your blood pressure checked every year. You should have your blood pressure measured twice-once when you are at a hospital or clinic, and once when you are not at a hospital or clinic. Record the average of the two measurements. To check your blood pressure  when you are not at a hospital or clinic, you can use:  An automated blood pressure machine at a pharmacy.  A home blood pressure monitor.  If you are between 55 years and 79 years old, ask your health care provider if you should take aspirin to prevent strokes.  Have regular diabetes screenings. This involves taking a blood sample to check your fasting blood sugar level.  If you are at a normal weight and have a low risk for diabetes, have this test once every three years after 72 years of age.  If you are overweight and have a high risk for diabetes, consider being tested at a younger age or more often. Preventing infection Hepatitis B  If you have a higher risk for hepatitis B, you should be screened for this virus. You are considered at high risk for hepatitis B if:  You were born in a country where hepatitis B is common. Ask your health care provider which countries are   considered high risk.  Your parents were born in a high-risk country, and you have not been immunized against hepatitis B (hepatitis B vaccine).  You have HIV or AIDS.  You use needles to inject street drugs.  You live with someone who has hepatitis B.  You have had sex with someone who has hepatitis B.  You get hemodialysis treatment.  You take certain medicines for conditions, including cancer, organ transplantation, and autoimmune conditions. Hepatitis C  Blood testing is recommended for:  Everyone born from 1945 through 1965.  Anyone with known risk factors for hepatitis C. Osteoporosis and menopause  Osteoporosis is a disease in which the bones lose minerals and strength with aging. This can result in serious bone fractures. Your risk for osteoporosis can be identified using a bone density scan.  If you are 65 years of age or older, or if you are at risk for osteoporosis and fractures, ask your health care provider if you should be screened.  Ask your health care provider whether you should take  a calcium or vitamin D supplement to lower your risk for osteoporosis.  Menopause may have certain physical symptoms and risks.  Hormone replacement therapy may reduce some of these symptoms and risks. Talk to your health care provider about whether hormone replacement therapy is right for you. Follow these instructions at home:  Schedule regular health, dental, and eye exams.  Stay current with your immunizations.  Do not use any tobacco products including cigarettes, chewing tobacco, or electronic cigarettes.  If you are pregnant, do not drink alcohol.  If you are breastfeeding, limit how much and how often you drink alcohol.  Limit alcohol intake to no more than 1 drink per day for nonpregnant women. One drink equals 12 ounces of beer, 5 ounces of wine, or 1 ounces of hard liquor.  Do not use street drugs.  Do not share needles.  Ask your health care provider for help if you need support or information about quitting drugs.  Tell your health care provider if you often feel depressed.  Tell your health care provider if you have ever been abused or do not feel safe at home. This information is not intended to replace advice given to you by your health care provider. Make sure you discuss any questions you have with your health care provider. Document Released: 06/08/2011 Document Revised: 04/30/2016 Document Reviewed: 08/27/2015  2017 Elsevier  

## 2017-01-06 LAB — HEPATITIS C ANTIBODY: Hep C Virus Ab: <0.1 s/co ratio (ref 0.0–0.9)

## 2017-01-19 ENCOUNTER — Encounter: Payer: Self-pay | Admitting: Family Medicine

## 2017-01-21 ENCOUNTER — Ambulatory Visit (INDEPENDENT_AMBULATORY_CARE_PROVIDER_SITE_OTHER): Payer: PPO | Admitting: Obstetrics and Gynecology

## 2017-01-21 ENCOUNTER — Encounter: Payer: Self-pay | Admitting: Obstetrics and Gynecology

## 2017-01-21 VITALS — Ht 63.0 in

## 2017-01-21 DIAGNOSIS — L900 Lichen sclerosus et atrophicus: Secondary | ICD-10-CM | POA: Diagnosis not present

## 2017-01-21 DIAGNOSIS — N952 Postmenopausal atrophic vaginitis: Secondary | ICD-10-CM | POA: Diagnosis not present

## 2017-01-21 DIAGNOSIS — Z85048 Personal history of other malignant neoplasm of rectum, rectosigmoid junction, and anus: Secondary | ICD-10-CM | POA: Diagnosis not present

## 2017-01-21 NOTE — Patient Instructions (Addendum)
1. Apply Temovate ointment 0.05% topically to the posterior fourchette region every other day for the next 3 months 2. Return in 3 months for follow-up 3. Continue using Estrace cream intravaginal twice a week

## 2017-01-21 NOTE — Progress Notes (Signed)
Chief complaint: 1. Lichen sclerosus 2. Vaginal atrophy 3. History of rectal/anal cancer  Patient presents for a 7 month follow-up. She has been using Temovate ointment topically to the vulva twice weekly for maintenance therapy due to long history of lichen sclerosus. She states that she will occasionally use it more frequently when she had a flare of irritative symptoms including itching and burning. Since her last visit she has had colonoscopy which cleared her of any rectal or colon disease.  Past Medical History:  Diagnosis Date  . Cancer ([place]) 01/16/2004   anorectum chemo/ rad  . Carcinoma in situ of anal canal [id number]  . Depression   . Diffuse cystic mastopathy 12/30/12  . Fibroid   . Fibromyalgia 2006  . Hemorrhoids 2006  . Hypothyroidism   . Migraines 2006  . Other nonspecific finding on examination of urine [id number]  . Tubular adenoma of colon 07/08/2016  . Vulvitis 11/22/2015   Past Surgical History:  Procedure Laterality Date  . ANAL MASS EXC  2014  . BREAST BIOPSY Bilateral    neg  . BREAST SURGERY  2005   BREAST REDUCTION SX Breast Biopsy (left) Neg for cancer  . COLONOSCOPY  2008, 2013  . COLONOSCOPY WITH PROPOFOL N/A 07/08/2016   tubular adenoma/ COLONOSCOPY WITH PROPOFOL;  Surgeon: Christene Lye, MD;  Location: [facility] ENDOSCOPY;  Service: Endoscopy;  Laterality: N/A;  . COSMETIC SURGERY  20008   FACIAL  . REDUCTION MAMMAPLASTY Bilateral   . TONSILLECTOMY  2005  . VAGINAL HYSTERECTOMY  1986   Secondary to Fibriods    Review of Systems  Constitutional: Negative for chills, diaphoresis and fever.  Gastrointestinal: Negative for abdominal pain, blood in stool, constipation, diarrhea and vomiting.  Genitourinary: Negative for dysuria, frequency and urgency.  Skin: Negative for itching and rash.   OBJECTIVE: Ht 5\' 3"  (1.6 m)  Pleasant female in no acute distress Abdomen: Soft, nontender Pelvic exam: External Genitalia: Moderate atrophy;  agglutination of labia minora to the labia majora; right-sided  hypopigmentation on right labia majora; posterior fourchette with white epithelium/leukoplakia; No evidence of infection, induration or drainage. No epithelial skin breakdown Vagina: Moderately atrophic BUS: Normal Rectum: No ulceration seen today  ASSESSMENT: 1. Lichen sclerosus, stable from a symptomatic standpoint, but with obvious skin change 2. Vaginal atrophy, stable 3. History of anal cancer; asymptomatic; os post colonoscopy within the past year with normal findings  PLAN: 1. Increase Temovate ointment topically to the vulva every other day for the next 12 weeks 2. Return in 3 months for follow-up 3. Continue using Estrace cream intravaginal twice a week  A total of 15 minutes were spent face-to-face with the patient during this encounter and over half of that time dealt with counseling and coordination of care.  Brayton Mars, MD  Note: This dictation was prepared with Dragon dictation along with smaller phrase technology. Any transcriptional errors that result from this process are unintentional.

## 2017-01-22 ENCOUNTER — Encounter: Payer: Self-pay | Admitting: Obstetrics and Gynecology

## 2017-02-15 ENCOUNTER — Encounter: Payer: Self-pay | Admitting: General Surgery

## 2017-03-08 ENCOUNTER — Other Ambulatory Visit: Payer: Self-pay | Admitting: Family Medicine

## 2017-04-09 ENCOUNTER — Encounter: Payer: Self-pay | Admitting: *Deleted

## 2017-04-20 ENCOUNTER — Ambulatory Visit (INDEPENDENT_AMBULATORY_CARE_PROVIDER_SITE_OTHER): Payer: PPO | Admitting: Obstetrics and Gynecology

## 2017-04-20 ENCOUNTER — Encounter: Payer: Self-pay | Admitting: Obstetrics and Gynecology

## 2017-04-20 VITALS — BP 122/71 | HR 80 | Ht 63.0 in | Wt 176.1 lb

## 2017-04-20 DIAGNOSIS — N952 Postmenopausal atrophic vaginitis: Secondary | ICD-10-CM

## 2017-04-20 DIAGNOSIS — L900 Lichen sclerosus et atrophicus: Secondary | ICD-10-CM | POA: Diagnosis not present

## 2017-04-20 DIAGNOSIS — Z85048 Personal history of other malignant neoplasm of rectum, rectosigmoid junction, and anus: Secondary | ICD-10-CM | POA: Diagnosis not present

## 2017-04-20 NOTE — Patient Instructions (Signed)
1. Decreased the Temovate ointment to twice weekly application on the vulva 2. Follow-up with [name] next month as scheduled for consideration of perianal biopsy 3. Follow-up in 3 months

## 2017-04-20 NOTE — Progress Notes (Signed)
Chief complaint: 1. Lichen sclerosus 2. History of rectal/anal cancer 3. Perianal itching  Patient presents for 3 month follow-up on lichen sclerosus as well as history of rectal/anal cancer. She has been using Temovate ointment 0.05% topically every other day to the vulva and occasionally perianal region. She has not had any vulvar symptoms; however, she has been noting Perri anal itching on the left aspect of her anus. This area is similar to where her anal cancer was excised.  Past medical history, past surgical history, problem list, medications, and allergies are reviewed  OBJECTIVE: BP 122/71   Pulse 80   Ht 5\' 3"  (1.6 m)   Wt 176 lb 1.6 oz (79.9 kg)   BMI 31.19 kg/m  Pleasant female in no acute distress. Alert and oriented. Pelvic exam: External Genitalia: Moderate atrophy; agglutination of labia minora to the labia majora; right-sided  hypopigmentation on right labia majora; posterior fourchette with white epithelium/leukoplakia is stable; No evidence of infection, induration or drainage. No epithelial skin breakdown Vagina: Moderately atrophic BUS: Normal Rectum: Perianal leukoplakia is visualized between 2:00 and 5:00 in a semilunar shape measuring 3 x 7 mm (this corresponds to the area of perirectal itching)  ASSESSMENT: 1. 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 History of rectal/anal cancer, status post excision; now with perirectal itching and visible leukoplakia between 2 and 5:00  PLAN: 1. Continue Temovate ointment 0.05% topically twice a week to the vulva 2. Return to see [name] per in general surgery for assessment and possible biopsy 3. Return in 3 months for follow-up here.  A total of 15 minutes were spent face-to-face with the patient during this encounter and over half of that time dealt with counseling and coordination of care.  Brayton Mars,  MD  Note: This dictation was prepared with Dragon dictation along with smaller phrase technology. Any transcriptional errors that result from this process are unintentional.

## 2017-04-28 ENCOUNTER — Encounter: Payer: Self-pay | Admitting: Family Medicine

## 2017-04-28 ENCOUNTER — Ambulatory Visit (INDEPENDENT_AMBULATORY_CARE_PROVIDER_SITE_OTHER): Payer: PPO | Admitting: Family Medicine

## 2017-04-28 VITALS — BP 114/62 | HR 84 | Temp 98.5°F | Resp 16 | Wt 176.0 lb

## 2017-04-28 DIAGNOSIS — E784 Other hyperlipidemia: Secondary | ICD-10-CM

## 2017-04-28 DIAGNOSIS — F3289 Other specified depressive episodes: Secondary | ICD-10-CM | POA: Diagnosis not present

## 2017-04-28 DIAGNOSIS — E039 Hypothyroidism, unspecified: Secondary | ICD-10-CM | POA: Diagnosis not present

## 2017-04-28 DIAGNOSIS — Z0000 Encounter for general adult medical examination without abnormal findings: Secondary | ICD-10-CM | POA: Diagnosis not present

## 2017-04-28 DIAGNOSIS — E7849 Other hyperlipidemia: Secondary | ICD-10-CM

## 2017-04-28 NOTE — Progress Notes (Signed)
Patient: Melissa [name], Female    DOB: 10-02-1946, 71 y.o.   MRN: [id number] Visit Date: 04/28/2017  Today's Provider: Wilhemena Durie, MD   Chief Complaint  Patient presents with  . Annual Exam   Subjective:  Melissa [name] is a 71 y.o. female who presents today for health maintenance and complete physical. She feels fairly well. She reports exercising not routine work out classes but does a lot of yard work. She reports she is sleeping well. Immunization History  Administered Date(s) Administered  . Influenza, High Dose Seasonal PF 09/04/2015, 09/02/2016  . Influenza-Unspecified 10/07/2013, 08/07/2014  . Pneumococcal Conjugate-13 10/25/2014  . Pneumococcal Polysaccharide-23 04/09/2006, 12/10/2011  . Tdap 09/16/2011   6CIT Screen 01/05/2017  What Year? 0 points  What month? 0 points  What time? 0 points  Count back from 20 0 points  Months in reverse 2 points  Repeat phrase 4 points  Total Score 6   Last  colonoscopy 07/08/16 decubitus ulcer found in perianal exam, 1 polyp otherwise normal.  BMD 08/02/13 osteopenia.  Mammogram 12/22/16  Pap smear -sees Dr Melody Haver. Last visit was in mAy 2018. Review of Systems  Constitutional: Negative.   HENT: Positive for tinnitus.   Eyes: Negative.   Respiratory: Negative.   Cardiovascular: Positive for leg swelling.  Gastrointestinal: Positive for anal bleeding, constipation, diarrhea and rectal pain.  Endocrine: Positive for heat intolerance and polydipsia.  Genitourinary: Positive for enuresis.  Musculoskeletal: Positive for arthralgias and myalgias.  Skin: Negative.   Allergic/Immunologic: Negative.   Neurological: Negative.   Hematological: Negative.   Psychiatric/Behavioral: Negative.     Social History   Social History  . Marital status: Married    Spouse name: N/A  . Number of children: N/A  . Years of education: N/A   Occupational History  . Not on file.   Social History Main Topics  .  Smoking status: Never Smoker  . Smokeless tobacco: Never Used  . Alcohol use No  . Drug use: No  . Sexual activity: Not Currently    Birth control/ protection: Surgical   Other Topics Concern  . Not on file   Social History Narrative  . No narrative on file    Patient Active Problem List   Diagnosis Date Noted  . Lichen sclerosus 38/09/1750  . Vulvitis 11/22/2015  . Atrophic vaginitis 04/11/2015  . CA of rectum ([place]) 04/11/2015  . Chronic diarrhea 04/11/2015  . CD (contact dermatitis) 04/11/2015  . 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 Bloodgood disease 04/11/2015  . Fibrositis 04/11/2015  . Acid reflux 04/11/2015  . Acquired hypothyroidism 04/11/2015  . Hypersomnia 04/11/2015  . Internal hemorrhoids 04/11/2015  . Muscle ache 04/11/2015  . Neuralgia neuritis, sciatic nerve 04/11/2015  . Herpes zoster 04/11/2015  . Apnea, sleep 04/11/2015  . 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 Inflammation of urethra 04/11/2015  . 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 Fibromyalgia   . Diffuse cystic mastopathy   . Migraines   . Other nonspecific finding on examination of urine   . ANKLE PAIN, RIGHT 10/01/2009  . 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 ANKLE SPRAIN, RIGHT 10/01/2009  . HYPERLIPIDEMIA 11/22/2007  . OSTEOARTHRITIS 11/22/2007  . PLANTAR FASCIITIS, BILATERAL 11/22/2007  . COLON CANCER, HX OF 11/22/2007    Past Surgical History:  Procedure Laterality Date  . ANAL MASS EXC  2014  . BREAST BIOPSY Bilateral    neg  . BREAST SURGERY  2005  BREAST REDUCTION SX Breast Biopsy (left) Neg for cancer  . COLONOSCOPY  2008, 2013  . COLONOSCOPY WITH PROPOFOL N/A 07/08/2016   tubular adenoma/ COLONOSCOPY WITH PROPOFOL;  Surgeon: Christene Lye, MD;  Location: [facility] ENDOSCOPY;  Service: Endoscopy;  Laterality: N/A;  . COSMETIC SURGERY  20008   FACIAL  . REDUCTION MAMMAPLASTY Bilateral   . TONSILLECTOMY  2005  . VAGINAL HYSTERECTOMY  1986   Secondary  to [place]    Her family history includes Breast cancer in her paternal aunt; Cancer in her father; Diabetes in her father; Heart disease in her maternal grandmother; Stroke in her sister.     Outpatient Encounter Prescriptions as of 04/28/2017  Medication Sig  . acetaminophen (TYLENOL) 650 MG CR tablet Take 650 mg by mouth daily as needed for pain.  . cholecalciferol (VITAMIN D) 1000 units tablet Take 1,000 Units by mouth daily.  . cholestyramine (QUESTRAN) 4 g packet TAKE 1 PACKET (4 G TOTAL) BY MOUTH 2 (TWO) TIMES DAILY.  . clobetasol ointment (TEMOVATE) 7.12 % Apply 1 application topically 2 (two) times daily. Twice a day for 14 days then daily for 4 weeks (Patient taking differently: Apply 1 application topically. Twice a day for 14 days then daily for 4 weeks)  . diphenhydrAMINE (BENADRYL) 25 MG tablet Take 25 mg by mouth every 6 (six) hours as needed for itching.  . estradiol (ESTRACE VAGINAL) 0.1 MG/GM vaginal cream Place 1 Applicatorful vaginally 2 (two) times a week.  . levothyroxine (SYNTHROID, LEVOTHROID) 175 MCG tablet Take 1 tablet (175 mcg total) by mouth daily before breakfast.  . loperamide (IMODIUM) 2 MG capsule Take 2 mg by mouth 4 (four) times daily as needed for diarrhea or loose stools.  . Magnesium 500 MG CAPS Take 1 capsule by mouth daily.  . Multiple Vitamins-Minerals (MULTIVITAL) tablet Take 1 tablet by mouth daily.  . nabumetone (RELAFEN) 500 MG tablet TAKE 1 TABLET (500 MG TOTAL) BY MOUTH 2 (TWO) TIMES DAILY AS NEEDED. (Patient taking differently: Take 500 mg by mouth 2 (two) times daily. )  . sertraline (ZOLOFT) 100 MG tablet Take 1 tablet (100 mg total) by mouth 2 (two) times daily.   No facility-administered encounter medications on file as of 04/28/2017.     Patient Care Team: Jerrol Banana., MD as PCP - General (Unknown Physician Specialty) Christene Lye, MD (General Surgery) Defrancesco, Alanda Slim, MD as Consulting Physician (Obstetrics and  Gynecology) Thelma Comp, OD as Consulting Physician (Optometry)      Objective:   Vitals:  Vitals:   04/28/17 1042  BP: 114/62  Pulse: 84  Resp: 16  Temp: 98.5 F (36.9 C)  Weight: 176 lb (79.8 kg)    Physical Exam  Constitutional: She is oriented to person, place, and time. She appears well-developed and well-nourished.  HENT:  Head: Normocephalic and atraumatic.  Right Ear: External ear normal.  Left Ear: External ear normal.  Eyes: Conjunctivae are normal. Pupils are equal, round, and reactive to light.  Neck: Normal range of motion. Neck supple.  Cardiovascular: Normal rate, regular rhythm, normal heart sounds and intact distal pulses.  Exam reveals no gallop.   No murmur heard. Pulmonary/Chest: Effort normal and breath sounds normal. No respiratory distress. She has no wheezes.  Musculoskeletal: She exhibits no edema or tenderness.  Neurological: She is alert and oriented to person, place, and time. No cranial nerve deficit.  Skin: No rash noted. No erythema.  Psychiatric: She has a normal mood and affect.  Her behavior is normal. Judgment and thought content normal.     Depression Screen PHQ 2/9 Scores 01/05/2017 11/11/2015  PHQ - 2 Score 0 0      Assessment & Plan:     Routine Health Maintenance and Physical Exam  Exercise Activities and Dietary recommendations Goals    . Increase water intake          Starting 01/05/17, I will continue to drink 6-8 glasses of water a day.      1. Annual physical exam - CBC with Differential/Platelet  2. Acquired hypothyroidism - TSH  3. Other hyperlipidemia - CBC with Differential/Platelet - Comprehensive metabolic panel - Lipid Panel With LDL/HDL Ratio  4. Other depression In remission. - CBC with Differential/Platelet - TSH  HPI, Exam and A&P transcribed by Theressa Millard, RMA under direction and in the presence of Miguel Aschoff, MD.   Discussed health benefits of physical activity, and  encouraged her to engage in regular exercise appropriate for her age and condition.   I have done the exam and reviewed the chart and it is accurate to the best of my knowledge. Development worker, community has been used and  any errors in dictation or transcription are unintentional. Miguel Aschoff M.D. [facility]

## 2017-05-20 DIAGNOSIS — F3289 Other specified depressive episodes: Secondary | ICD-10-CM | POA: Diagnosis not present

## 2017-05-20 DIAGNOSIS — Z0000 Encounter for general adult medical examination without abnormal findings: Secondary | ICD-10-CM | POA: Diagnosis not present

## 2017-05-20 DIAGNOSIS — E784 Other hyperlipidemia: Secondary | ICD-10-CM | POA: Diagnosis not present

## 2017-05-20 DIAGNOSIS — E039 Hypothyroidism, unspecified: Secondary | ICD-10-CM | POA: Diagnosis not present

## 2017-05-21 LAB — CBC WITH DIFFERENTIAL/PLATELET
BASOS ABS: 0 10*3/uL (ref 0.0–0.2)
Basos: 1 %
EOS (ABSOLUTE): 0.2 10*3/uL (ref 0.0–0.4)
Eos: 3 %
HEMATOCRIT: 40.6 % (ref 34.0–46.6)
HEMOGLOBIN: 13.3 g/dL (ref 11.1–15.9)
Immature Grans (Abs): 0 10*3/uL (ref 0.0–0.1)
Immature Granulocytes: 0 %
LYMPHS ABS: 2.3 10*3/uL (ref 0.7–3.1)
Lymphs: 49 %
MCH: 31.2 pg (ref 26.6–33.0)
MCHC: 32.8 g/dL (ref 31.5–35.7)
MCV: 95 fL (ref 79–97)
MONOCYTES: 7 %
MONOS ABS: 0.4 10*3/uL (ref 0.1–0.9)
NEUTROS ABS: 1.9 10*3/uL (ref 1.4–7.0)
Neutrophils: 40 %
Platelets: 248 10*3/uL (ref 150–379)
RBC: 4.26 x10E6/uL (ref 3.77–5.28)
RDW: 15.7 % — AB (ref 12.3–15.4)
WBC: 4.8 10*3/uL (ref 3.4–10.8)

## 2017-05-21 LAB — COMPREHENSIVE METABOLIC PANEL
ALBUMIN: 4.4 g/dL (ref 3.5–4.8)
ALK PHOS: 89 IU/L (ref 39–117)
ALT: 25 IU/L (ref 0–32)
AST: 22 IU/L (ref 0–40)
Albumin/Globulin Ratio: 1.8 (ref 1.2–2.2)
BILIRUBIN TOTAL: 0.3 mg/dL (ref 0.0–1.2)
BUN / CREAT RATIO: 20 (ref 12–28)
BUN: 17 mg/dL (ref 8–27)
CHLORIDE: 104 mmol/L (ref 96–106)
CO2: 22 mmol/L (ref 20–29)
Calcium: 9.2 mg/dL (ref 8.7–10.3)
Creatinine, Ser: 0.83 mg/dL (ref 0.57–1.00)
GFR calc Af Amer: 81 mL/min/{1.73_m2} (ref >59)
GFR calc non Af Amer: 71 mL/min/{1.73_m2} (ref >59)
GLOBULIN, TOTAL: 2.4 g/dL (ref 1.5–4.5)
GLUCOSE: 96 mg/dL (ref 65–99)
Potassium: 4.4 mmol/L (ref 3.5–5.2)
SODIUM: 139 mmol/L (ref 134–144)
Total Protein: 6.8 g/dL (ref 6.0–8.5)

## 2017-05-21 LAB — LIPID PANEL WITH LDL/HDL RATIO
CHOLESTEROL TOTAL: 246 mg/dL — AB (ref 100–199)
HDL: 60 mg/dL (ref >39)
LDL Calculated: 114 mg/dL — ABNORMAL HIGH (ref 0–99)
LDl/HDL Ratio: 1.9 ratio (ref 0.0–3.2)
TRIGLYCERIDES: 360 mg/dL — AB (ref 0–149)
VLDL Cholesterol Cal: 72 mg/dL — ABNORMAL HIGH (ref 5–40)

## 2017-05-21 LAB — TSH: TSH: 13.61 u[IU]/mL — ABNORMAL HIGH (ref 0.450–4.500)

## 2017-06-03 ENCOUNTER — Other Ambulatory Visit: Payer: Self-pay | Admitting: Family Medicine

## 2017-06-30 ENCOUNTER — Ambulatory Visit: Payer: PPO | Admitting: General Surgery

## 2017-06-30 ENCOUNTER — Encounter: Payer: Self-pay | Admitting: General Surgery

## 2017-06-30 ENCOUNTER — Ambulatory Visit (INDEPENDENT_AMBULATORY_CARE_PROVIDER_SITE_OTHER): Payer: PPO | Admitting: General Surgery

## 2017-06-30 VITALS — BP 120/70 | HR 74 | Resp 12 | Ht 63.0 in | Wt 176.0 lb

## 2017-06-30 DIAGNOSIS — Z86008 Personal history of in-situ neoplasm of other site: Secondary | ICD-10-CM

## 2017-06-30 DIAGNOSIS — Z86004 Personal history of in-situ neoplasm of other and unspecified digestive organs: Secondary | ICD-10-CM

## 2017-06-30 NOTE — Progress Notes (Signed)
Patient ID: Melissa [name], female   DOB: [birth year], 71 y.o.   MRN: [id number]  Chief Complaint  Patient presents with  . Follow-up    HPI Melissa [name] is a 71 y.o. female here today for her six month follow up anal ulcer. Patient states the area is doing okay. The area is itching.  HPI  Past Medical History:  Diagnosis Date  . Cancer ([place]) 01/16/2004   anorectum chemo/ rad  . Carcinoma in situ of anal canal [id number]  . Depression   . Diffuse cystic mastopathy 12/30/12  . Fibroid   . Fibromyalgia 2006  . Hemorrhoids 2006  . Hypothyroidism   . Migraines 2006  . Other nonspecific finding on examination of urine [id number]  . Tubular adenoma of colon 07/08/2016  . Vulvitis 11/22/2015    Past Surgical History:  Procedure Laterality Date  . ANAL MASS EXC  2014  . BREAST BIOPSY Bilateral    neg  . BREAST SURGERY  2005   BREAST REDUCTION SX Breast Biopsy (left) Neg for cancer  . COLONOSCOPY  2008, 2013  . COLONOSCOPY WITH PROPOFOL N/A 07/08/2016   tubular adenoma/ COLONOSCOPY WITH PROPOFOL;  Surgeon: Christene Lye, MD;  Location: [facility] ENDOSCOPY;  Service: Endoscopy;  Laterality: N/A;  . COSMETIC SURGERY  20008   FACIAL  . REDUCTION MAMMAPLASTY Bilateral   . TONSILLECTOMY  2005  . VAGINAL HYSTERECTOMY  1986   Secondary to [place]    Family History  Problem Relation Age of Onset  . Diabetes Father   . Cancer Father   . Stroke Sister   . Heart disease Maternal Grandmother   . Breast cancer Paternal Aunt   . Ovarian cancer Neg Hx     Social History Social History  Substance Use Topics  . Smoking status: Never Smoker  . Smokeless tobacco: Never Used  . Alcohol use No    Allergies  Allergen Reactions  . Pentazocine Lactate   . Propoxyphene Hcl   . Propoxyphene N-Acetaminophen   . Talwin  [Pentazocine]   . Tramadol Hcl   . Tramadol Hcl   . Ciprofloxacin Rash  . Ciprofloxacin Hcl Rash  . Darvon  [Propoxyphene] Rash   Hallucinations Hallucinations.  . Dicloxacillin Rash  . Dicloxacillin Sodium Rash  . Etodolac Rash    Current Outpatient Prescriptions  Medication Sig Dispense Refill  . acetaminophen (TYLENOL) 650 MG CR tablet Take 650 mg by mouth daily as needed for pain.    . cholecalciferol (VITAMIN D) 1000 units tablet Take 1,000 Units by mouth daily.    . clobetasol ointment (TEMOVATE) 7.25 % Apply 1 application topically 2 (two) times daily. Twice a day for 14 days then daily for 4 weeks (Patient taking differently: Apply 1 application topically. Twice a day for 14 days then daily for 4 weeks) 30 g 3  . Cyanocobalamin 1000 MCG/ML LIQD Take by mouth daily.    . diphenhydrAMINE (BENADRYL) 25 MG tablet Take 25 mg by mouth every 6 (six) hours as needed for itching.    . levothyroxine (SYNTHROID, LEVOTHROID) 175 MCG tablet Take 1 tablet (175 mcg total) by mouth daily before breakfast. 90 tablet 3  . loperamide (IMODIUM) 2 MG capsule Take 2 mg by mouth 4 (four) times daily as needed for diarrhea or loose stools.    . Magnesium 500 MG CAPS Take 1 capsule by mouth daily.    . Multiple Vitamins-Minerals (MULTIVITAL) tablet Take 1 tablet by mouth daily.    Marland Kitchen  nabumetone (RELAFEN) 500 MG tablet TAKE 1 TABLET (500 MG TOTAL) BY MOUTH 2 (TWO) TIMES DAILY AS NEEDED. (Patient taking differently: Take 500 mg by mouth 2 (two) times daily. ) 180 tablet 1  . Omega-3 Fatty Acids (FISH OIL) 1200 MG CAPS Take by mouth.    . sertraline (ZOLOFT) 100 MG tablet Take 1 tablet (100 mg total) by mouth 2 (two) times daily. 180 tablet 3   No current facility-administered medications for this visit.     Review of Systems Review of Systems  Constitutional: Negative.   Respiratory: Negative.   Cardiovascular: Negative.   Gastrointestinal: Negative.   Genitourinary:       Anal itching    Blood pressure 120/70, pulse 74, resp. rate 12, height 5\' 3"  (1.6 m), weight 176 lb (79.8 kg).  Physical Exam Physical Exam    Constitutional: She is oriented to person, place, and time. She appears well-developed and well-nourished.  Cardiovascular: Normal rate, regular rhythm and normal heart sounds.   Pulmonary/Chest: Effort normal and breath sounds normal.  Abdominal: Soft. Bowel sounds are normal. There is no tenderness.  Genitourinary:     Neurological: She is alert and oriented to person, place, and time.  Skin: Skin is warm and dry.    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 Perianal ulceration that has been biopsied twice- no maignancy- area appears much improved from previous exams.  History of colon polyp-tubular adenoma. Colonoscopy due in 2022   Plan   Return in 6 months to follow up with Dr. Bary Castilla, already scheduled for bilateral screening mammogram then    The patient is aware to call back for any questions or concerns.    HPI, Physical Exam, Assessment and Plan have been scribed under the direction and in the presence of Mckinley Jewel, MD  Gaspar Cola, CMA  I have completed the exam and reviewed the above documentation for accuracy and completeness.  I agree with the above.  Haematologist has been used and any errors in dictation or transcription are unintentional.  Seeplaputhur G. Jamal Collin, M.D., F.A.C.S.  Junie Panning G 07/01/2017, 10:12 AM

## 2017-06-30 NOTE — Patient Instructions (Signed)
Return in 6 months to follow up with Dr. Bary Castilla

## 2017-07-21 ENCOUNTER — Encounter: Payer: Self-pay | Admitting: Obstetrics and Gynecology

## 2017-07-21 ENCOUNTER — Encounter (INDEPENDENT_AMBULATORY_CARE_PROVIDER_SITE_OTHER): Payer: PPO | Admitting: Obstetrics and Gynecology

## 2017-07-21 VITALS — BP 117/71 | HR 88 | Ht 63.0 in | Wt 174.1 lb

## 2017-07-21 MED ORDER — CLOBETASOL PROPIONATE 0.05 % EX OINT: 1.0000 "application " | TOPICAL_OINTMENT | Freq: Two times a day (BID) | CUTANEOUS | 3 refills | Status: DC

## 2017-07-23 NOTE — Progress Notes (Signed)
Appointment canceled Patient to return at her convenience for follow-up  Brayton Mars, MD   This encounter was created in error - please disregard.

## 2017-09-02 ENCOUNTER — Ambulatory Visit (INDEPENDENT_AMBULATORY_CARE_PROVIDER_SITE_OTHER): Payer: PPO

## 2017-09-02 DIAGNOSIS — Z23 Encounter for immunization: Secondary | ICD-10-CM

## 2017-09-03 ENCOUNTER — Other Ambulatory Visit: Payer: Self-pay | Admitting: Family Medicine

## 2017-09-16 DIAGNOSIS — H5203 Hypermetropia, bilateral: Secondary | ICD-10-CM | POA: Diagnosis not present

## 2017-09-16 DIAGNOSIS — H524 Presbyopia: Secondary | ICD-10-CM | POA: Diagnosis not present

## 2017-09-16 DIAGNOSIS — H2513 Age-related nuclear cataract, bilateral: Secondary | ICD-10-CM | POA: Diagnosis not present

## 2017-09-21 ENCOUNTER — Encounter: Payer: Self-pay | Admitting: Obstetrics and Gynecology

## 2017-09-21 ENCOUNTER — Ambulatory Visit (INDEPENDENT_AMBULATORY_CARE_PROVIDER_SITE_OTHER): Payer: PPO | Admitting: Obstetrics and Gynecology

## 2017-09-21 VITALS — BP 117/67 | HR 79 | Ht 63.0 in | Wt 177.1 lb

## 2017-09-21 DIAGNOSIS — K626 Ulcer of anus and rectum: Secondary | ICD-10-CM | POA: Diagnosis not present

## 2017-09-21 DIAGNOSIS — Z85048 Personal history of other malignant neoplasm of rectum, rectosigmoid junction, and anus: Secondary | ICD-10-CM | POA: Diagnosis not present

## 2017-09-21 DIAGNOSIS — L900 Lichen sclerosus et atrophicus: Secondary | ICD-10-CM | POA: Diagnosis not present

## 2017-09-21 NOTE — Patient Instructions (Addendum)
1. Continue using Temovate ointment 0.05% topically to the vulva twice a week 2. Return in 6 months for follow-up  3. Follow-up with general surgery in January 2019

## 2017-09-21 NOTE — Progress Notes (Signed)
Chief complaint: 1. Lichen sclerosis of vulva 2. History of rectal/anal cancer, status post surgery and radiation 3. History of rectal ulceration  Melissa [name] presents for interval follow-up. She is using Temovate ointment 0.05% topically to the vulva twice a week. She remains asymptomatic without itching or burning. Regarding her rectal cancer/anal cancer, she does have occasional irritationwhen she is about to have a bowel movement. She is getting follow-up with general surgery-[name], and now [name] following [name]'s retirement.  Past Medical History:  Diagnosis Date  . Cancer ([place]) 01/16/2004   anorectum chemo/ rad  . Carcinoma in situ of anal canal [id number]  . Depression   . Diffuse cystic mastopathy 12/30/12  . Fibroid   . Fibromyalgia 2006  . Hemorrhoids 2006  . Hypothyroidism   . Migraines 2006  . Other nonspecific finding on examination of urine [id number]  . Tubular adenoma of colon 07/08/2016  . Vulvitis 11/22/2015    Past Surgical History:  Procedure Laterality Date  . ANAL MASS EXC  2014  . BREAST BIOPSY Bilateral    neg  . BREAST SURGERY  2005   BREAST REDUCTION SX Breast Biopsy (left) Neg for cancer  . COLONOSCOPY  2008, 2013  . COLONOSCOPY WITH PROPOFOL N/A 07/08/2016   tubular adenoma/ COLONOSCOPY WITH PROPOFOL;  Surgeon: Christene Lye, MD;  Location: [facility] ENDOSCOPY;  Service: Endoscopy;  Laterality: N/A;  . COSMETIC SURGERY  20008   FACIAL  . REDUCTION MAMMAPLASTY Bilateral   . TONSILLECTOMY  2005  . VAGINAL HYSTERECTOMY  1986   Secondary to [place]    Review of systems:Review of Systems  Constitutional: Negative.   Respiratory: Negative.   Cardiovascular: Negative.   Gastrointestinal: Negative.        Perirectal itching just prior to defecation  Genitourinary: Negative.   Musculoskeletal: Negative.   Skin: Negative.   Neurological: Negative.   Endo/Heme/Allergies: Negative.   Psychiatric/Behavioral: Negative.  Suicidal ideas:   ASSESSMENT:   BP 117/67   Pulse 79   Ht 5\' 3"  (1.6 m)   Wt 177 lb 1.6 oz (80.3 kg)   BMI 31.37 kg/m  Pleasant well-appearing female in no acute distress. Alert and oriented. 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 Lichen sclerosus, stable 2. Rectal/anal ulceration stable-minimal symptoms just prior to bowel movements  PLAN: 1. Continue with Temovate ointment 0.05% topically to the vulva twice a week 2. Return in 6 months for follow-up 3. Maintain follow-up with general surgery regarding rectal/anal ulcer  A total of 15 minutes were spent face-to-face with the patient during this encounter and over half of that time dealt with counseling and coordination of care.  Brayton Mars, MD  Note: This dictation was prepared with Dragon dictation along with smaller phrase technology. Any transcriptional errors that result from this process are unintentional.

## 2017-10-21 ENCOUNTER — Other Ambulatory Visit: Payer: Self-pay

## 2017-10-21 DIAGNOSIS — Z1231 Encounter for screening mammogram for malignant neoplasm of breast: Secondary | ICD-10-CM

## 2017-12-01 ENCOUNTER — Other Ambulatory Visit: Payer: Self-pay

## 2017-12-01 MED ORDER — CHOLESTYRAMINE LIGHT 4 G PO POWD: 4.0000 g | Freq: Two times a day (BID) | ORAL | 12 refills | Status: DC

## 2017-12-02 ENCOUNTER — Other Ambulatory Visit: Payer: Self-pay | Admitting: Family Medicine

## 2017-12-02 ENCOUNTER — Other Ambulatory Visit: Payer: Self-pay

## 2017-12-02 MED ORDER — CHOLESTYRAMINE 4 GM/DOSE PO POWD: ORAL | 12 refills | Status: DC

## 2017-12-02 NOTE — Telephone Encounter (Signed)
[facility] faxed a refill request for a 90-days supply for the following medication. Thanks CC  nabumetone (RELAFEN) 500 MG tablet

## 2017-12-04 ENCOUNTER — Other Ambulatory Visit: Payer: Self-pay | Admitting: Family Medicine

## 2017-12-07 MED ORDER — NABUMETONE 500 MG PO TABS: 500.0000 mg | ORAL_TABLET | Freq: Two times a day (BID) | ORAL | 1 refills | Status: DC | PRN

## 2017-12-14 ENCOUNTER — Other Ambulatory Visit: Payer: Self-pay | Admitting: Family Medicine

## 2017-12-14 MED ORDER — SERTRALINE HCL 100 MG PO TABS: 100.0000 mg | ORAL_TABLET | Freq: Two times a day (BID) | ORAL | 3 refills | Status: DC

## 2017-12-14 NOTE — Telephone Encounter (Signed)
[facility] faxed a refill request for a 90-days supply for the following medication. Thanks CC ° °sertraline (ZOLOFT) 100 MG tablet  ° °

## 2017-12-19 IMAGING — MG MM DIGITAL SCREENING BILAT W/ CAD
4 series · 4 of 4 positions shown · non-contrast
Comparison: Previous exam(s).

CLINICAL DATA: Screening.

EXAM:
DIGITAL SCREENING BILATERAL MAMMOGRAM WITH CAD

[R MLO]
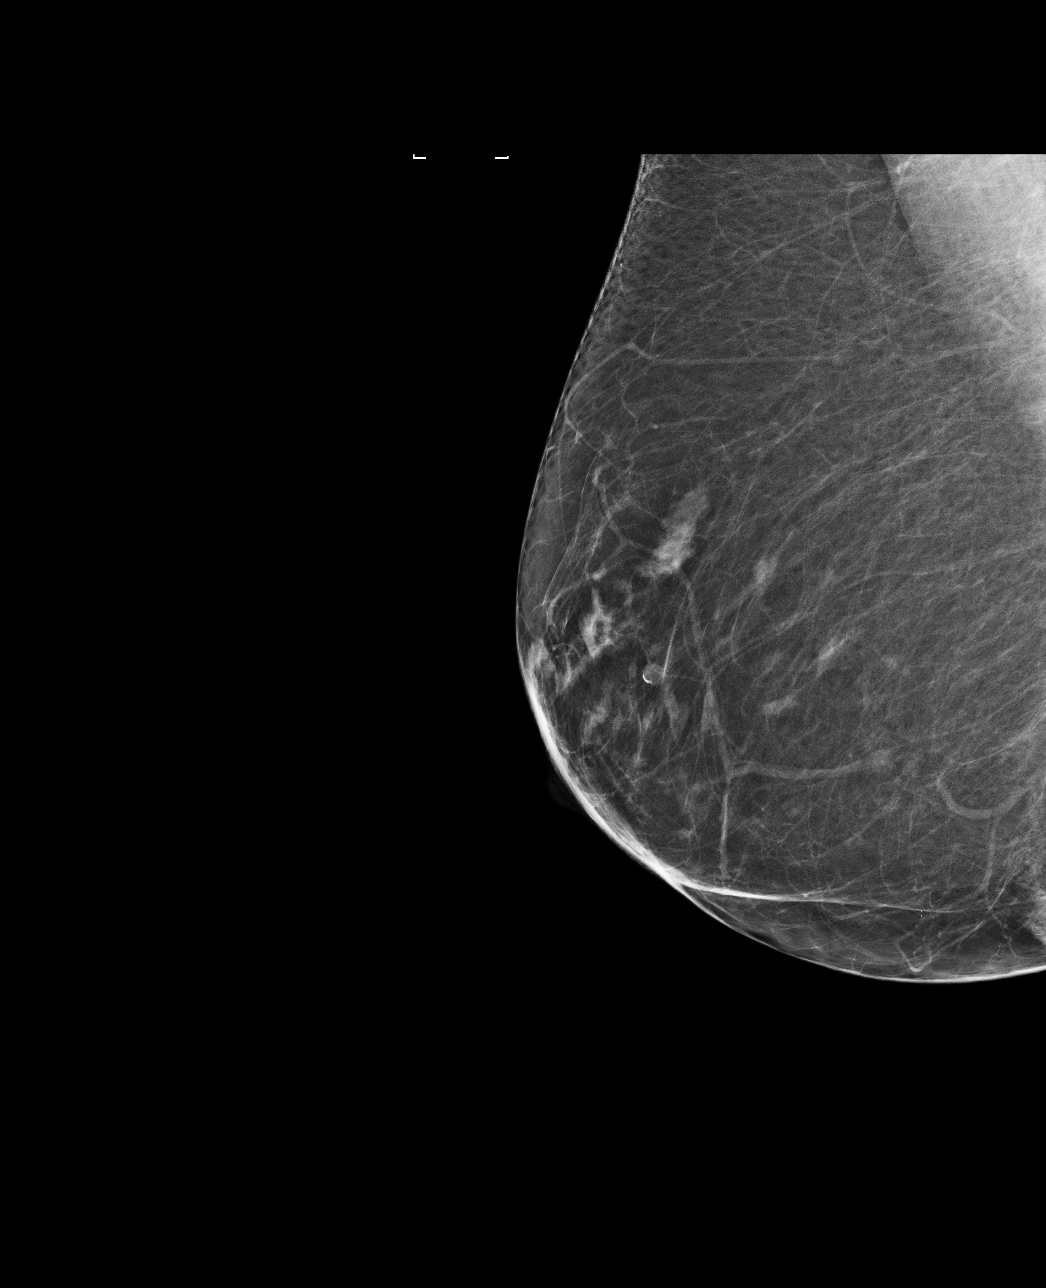

[L CC]
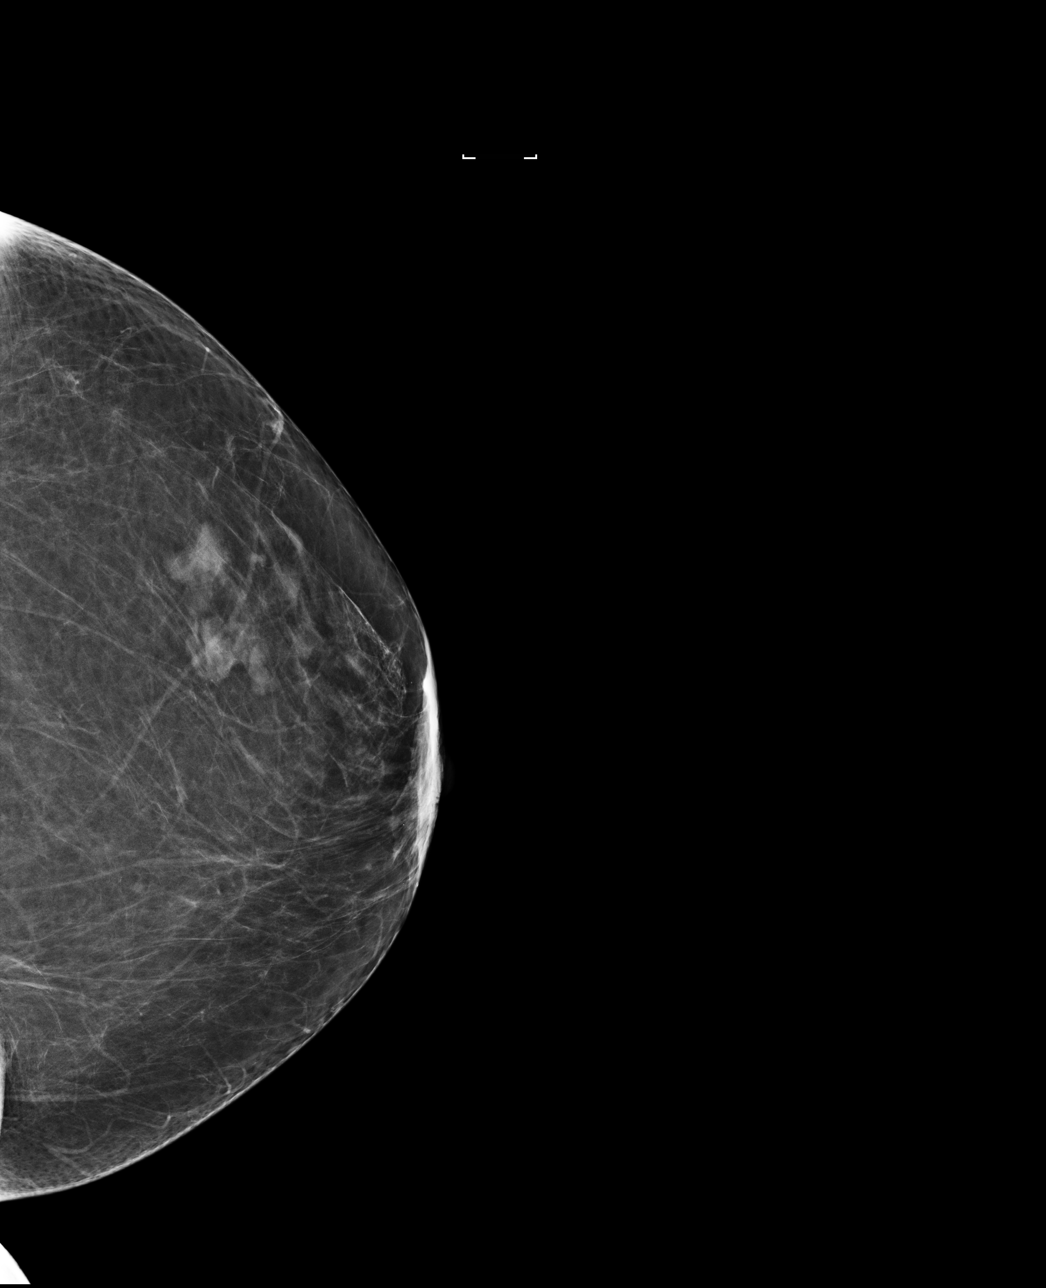

[R CC]
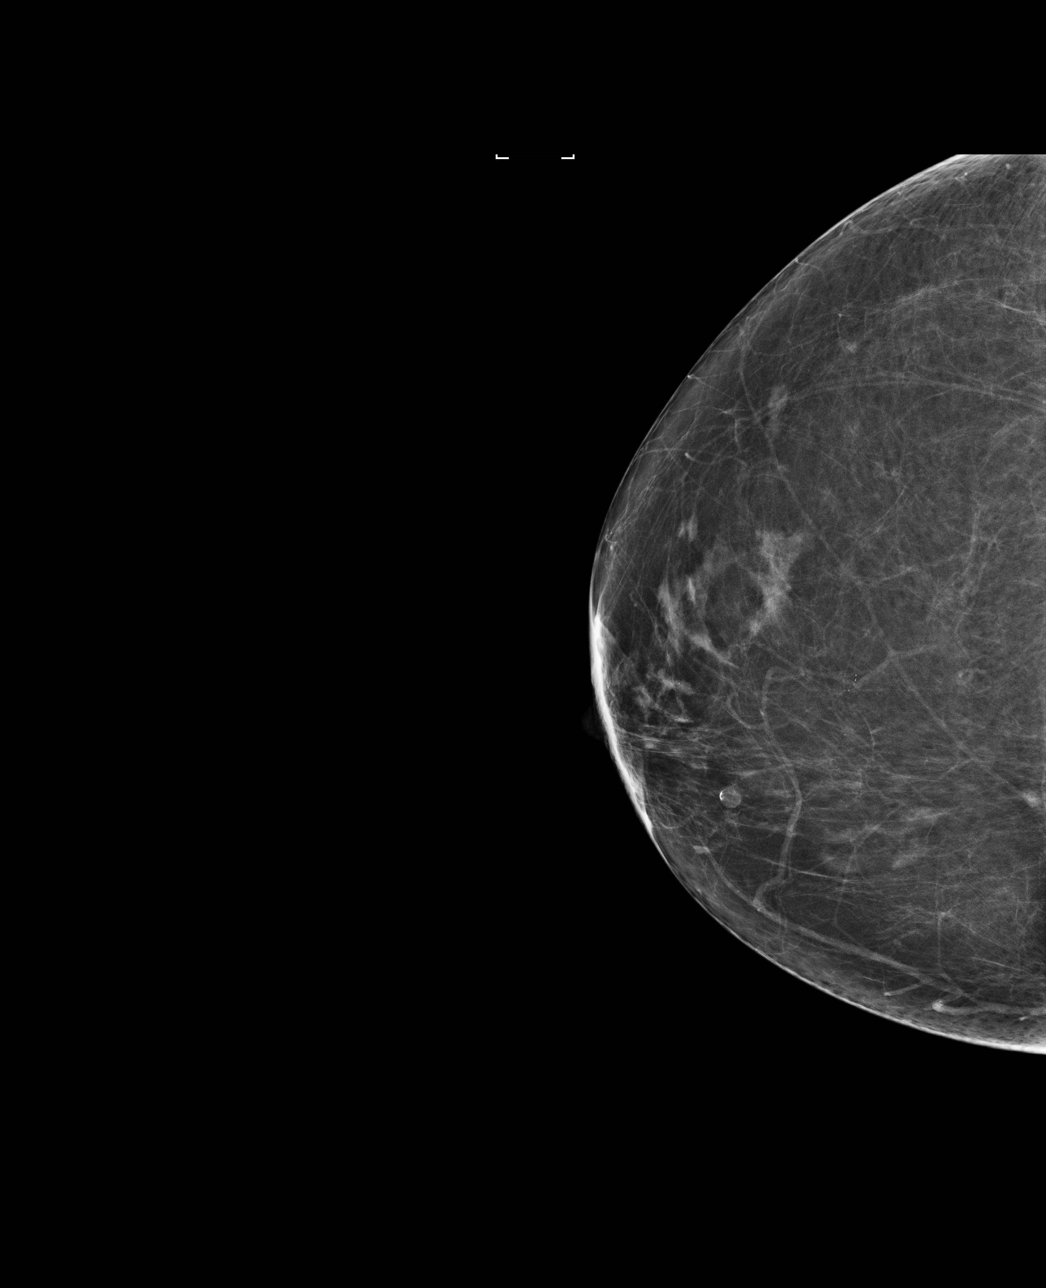

[L MLO]
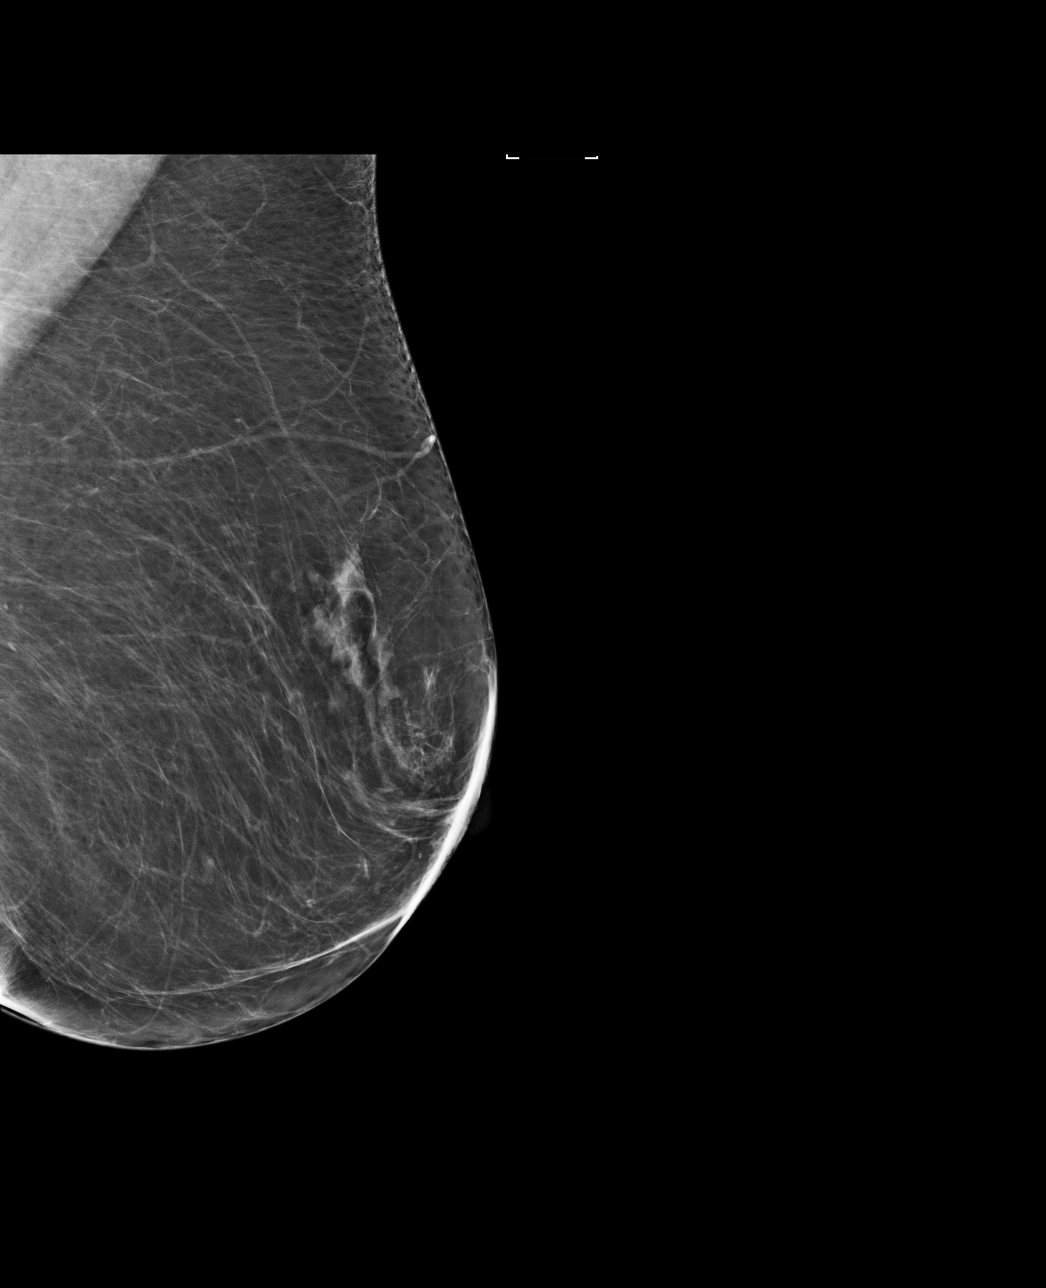

[4 of 4 positions shown; findings below may reference images not displayed]

ACR Breast Density Category b: There are scattered areas of
fibroglandular density.
FINDINGS: There are no findings suspicious for malignancy. Images were
processed with CAD.
IMPRESSION: No mammographic evidence of malignancy. A result letter of this
screening mammogram will be mailed directly to the patient.

RECOMMENDATION:
Screening mammogram in one year. (Code:AS-G-LCT)

BI-RADS CATEGORY  1: Negative.

## 2017-12-23 ENCOUNTER — Ambulatory Visit
Admission: RE | Admit: 2017-12-23 | Discharge: 2017-12-23 | Disposition: A | Discharge status: Home / Self Care | Payer: PPO | Source: Ambulatory Visit | Attending: General Surgery | Admitting: General Surgery

## 2017-12-23 ENCOUNTER — Other Ambulatory Visit: Payer: Self-pay | Admitting: General Surgery

## 2017-12-23 DIAGNOSIS — Z1231 Encounter for screening mammogram for malignant neoplasm of breast: Secondary | ICD-10-CM | POA: Insufficient documentation

## 2017-12-30 ENCOUNTER — Encounter: Payer: Self-pay | Admitting: General Surgery

## 2017-12-30 ENCOUNTER — Ambulatory Visit (INDEPENDENT_AMBULATORY_CARE_PROVIDER_SITE_OTHER): Payer: PPO | Admitting: General Surgery

## 2017-12-30 VITALS — BP 146/78 | HR 92 | Resp 12 | Ht 63.0 in | Wt 177.0 lb

## 2017-12-30 DIAGNOSIS — N6011 Diffuse cystic mastopathy of right breast: Secondary | ICD-10-CM | POA: Diagnosis not present

## 2017-12-30 DIAGNOSIS — R197 Diarrhea, unspecified: Secondary | ICD-10-CM

## 2017-12-30 DIAGNOSIS — Z86004 Personal history of in-situ neoplasm of other and unspecified digestive organs: Secondary | ICD-10-CM

## 2017-12-30 DIAGNOSIS — N6012 Diffuse cystic mastopathy of left breast: Secondary | ICD-10-CM | POA: Diagnosis not present

## 2017-12-30 DIAGNOSIS — Z86008 Personal history of in-situ neoplasm of other site: Secondary | ICD-10-CM

## 2017-12-30 NOTE — Progress Notes (Signed)
Patient ID: Melissa [name], female   DOB: 1946/02/19, 72 y.o.   MRN: [id number]  Chief Complaint  Patient presents with  . Follow-up    HPI Melissa [name] is a 72 y.o. female.  who presents for a breast evaluation. The most recent mammogram was done on 12-23-17. Also, follow up anal ulcer with history of anorectal cancer. Patient does perform regular self breast checks and gets regular mammograms done.  No breast issues other than occasional itching.  She states the anal area gets irritated with diarrhea, last episode September. She does admit to itching.  HPI  Past Medical History:  Diagnosis Date  . Cancer ([place]) 01/16/2004   anorectum chemo/ rad  . Carcinoma in situ of anal canal [id number]  . Depression   . Diffuse cystic mastopathy 12/30/12  . Fibroid   . Fibromyalgia 2006  . Hemorrhoids 2006  . Hypothyroidism   . Migraines 2006  . Other nonspecific finding on examination of urine [id number]  . Tubular adenoma of colon 07/08/2016  . Vulvitis 11/22/2015    Past Surgical History:  Procedure Laterality Date  . ANAL MASS EXC  2014  . anal ulcer  04/2016   SQUAMOUS MUCOSA WITH ACUTE ACTIVE ULCER AND REACTIVE HYPERPLASIA  . BREAST BIOPSY Bilateral    neg  . BREAST SURGERY  2005   BREAST REDUCTION SX Breast Biopsy (left) Neg for cancer  . COLONOSCOPY  2008, 2013  . COLONOSCOPY WITH PROPOFOL N/A 07/08/2016   tubular adenoma/ COLONOSCOPY WITH PROPOFOL;  Surgeon: Christene Lye, MD;  Location: [facility] END[facility]COPY;  Service: Endoscopy;  Laterality: N/A;  . COSMETIC SURGERY  20008   FACIAL  . REDUCTION MAMMAPLASTY Bilateral 1998  . TONSILLECTOMY  2005  . VAGINAL HYSTERECTOMY  1986   Secondary to [place]    Family History  Problem Relation Age of Onset  . Diabetes Father   . Cancer Father   . Stroke Sister   . Heart disease Maternal Grandmother   . Breast cancer Paternal Aunt   . Ovarian cancer Neg Hx     Social History Social History   Tobacco Use   . Smoking status: Never Smoker  . Smokeless tobacco: Never Used  Substance Use Topics  . Alcohol use: No  . Drug use: No    Allergies  Allergen Reactions  . Pentazocine Lactate   . Propoxyphene N-Acetaminophen   . Talwin  [Pentazocine]   . Tramadol Hcl   . Ciprofloxacin Hcl Rash  . Darvon  [Propoxyphene] Rash    Hallucinations Hallucinations.  . Dicloxacillin Rash  . Etodolac Rash    Current Outpatient Medications  Medication Sig Dispense Refill  . acetaminophen (TYLENOL) 650 MG CR tablet Take 650 mg by mouth daily as needed for pain.    . cholecalciferol (VITAMIN D) 1000 units tablet Take 1,000 Units by mouth daily.    . cholestyramine light 4 g POWD Take 1 packet (4 g total) by mouth 2 (two) times daily. 1 Can 12  . clobetasol ointment (TEMOVATE) 6.75 % Apply 1 application topically 2 (two) times daily. Twice a day for 14 days then daily for 4 weeks 30 g 3  . Cyanocobalamin 1000 MCG/ML LIQD Take by mouth daily.    . diphenhydrAMINE (BENADRYL) 25 MG tablet Take 25 mg by mouth every 6 (six) hours as needed for itching.    . levothyroxine (SYNTHROID, LEVOTHROID) 175 MCG tablet TAKE 1 TABLET (175 MCG TOTAL) BY MOUTH DAILY BEFORE BREAKFAST. 90 tablet 3  .  loperamide (IMODIUM) 2 MG capsule Take 2 mg by mouth 4 (four) times daily as needed for diarrhea or loose stools.    . Magnesium 500 MG CAPS Take 1 capsule by mouth daily.    . Multiple Vitamins-Minerals (MULTIVITAL) tablet Take 1 tablet by mouth daily.    . nabumetone (RELAFEN) 500 MG tablet Take 1 tablet (500 mg total) by mouth 2 (two) times daily as needed. 180 tablet 1  . Omega-3 Fatty Acids (FISH OIL) 1200 MG CAPS Take by mouth.    . sertraline (ZOLOFT) 100 MG tablet Take 1 tablet (100 mg total) by mouth 2 (two) times daily. 180 tablet 3   No current facility-administered medications for this visit.     Review of Systems Review of Systems  Constitutional: Negative.   Respiratory: Negative.   Cardiovascular: Negative.      Blood pressure (!) 146/78, pulse 92, resp. rate 12, height 5\' 3"  (1.6 m), weight 177 lb (80.3 kg), SpO2 98 %.  Physical Exam Physical Exam  Constitutional: She is oriented to person, place, and time. She appears well-developed and well-nourished.  HENT:  Mouth/Throat: Oropharynx is clear and moist.  Eyes: Conjunctivae are normal. No scleral icterus.  Neck: Neck supple.  Cardiovascular: Normal rate, regular rhythm, normal heart sounds and intact distal pulses.  No lower leg edema  Pulmonary/Chest: Effort normal and breath sounds normal. Right breast exhibits no inverted nipple, no mass, no nipple discharge, no skin change and no tenderness. Left breast exhibits no inverted nipple, no mass, no nipple discharge, no skin change and no tenderness.  Genitourinary:     Genitourinary Comments: Left posterior 0.7 x 2.4 cm ulceration  Lymphadenopathy:    She has no cervical adenopathy.    She has no axillary adenopathy.  Neurological: She is alert and oriented to person, place, and time.  Skin: Skin is warm and dry.  Psychiatric: Her behavior is normal.    Data Reviewed December 23, 2017 bilateral screening mammograms reviewed.  No interval change.  BI-RADS-1.  Assessment    Benign breast exam.  Chronic anal ulcer.  Previous biopsy showing inflammatory changes.  Decreased area (minimal) over 2 years.  Intermittent rectal bleeding.  Colonoscopy in 2017.    Plan         Recommend she stop magnesium that she was taking for fibromyalgia)  to see if that helps the loose stools, call with status report.  Patient will be asked to return to the office in one year with a bilateral screening mammogram and anorectal carcinoma.    HPI, Physical Exam, Assessment and Plan have been scribed under the direction and in the presence of Robert Bellow, MD. Karie Fetch, RN  I have completed the exam and reviewed the above documentation for accuracy and completeness.  I agree with the  above.  Haematologist has been used and any errors in dictation or transcription are unintentional.  Hervey Ard, M.D., F.A.C.S.  Forest Gleason Byrnett 01/01/2018, 7:21 AM

## 2017-12-30 NOTE — Patient Instructions (Addendum)
Recommend she stop magnesium to see if that helps the loose stools, call with status report Patient will be asked to return to the office in one year with a bilateral screening mammogram.

## 2018-01-01 DIAGNOSIS — R197 Diarrhea, unspecified: Secondary | ICD-10-CM | POA: Insufficient documentation

## 2018-01-25 ENCOUNTER — Other Ambulatory Visit: Payer: Self-pay

## 2018-01-25 ENCOUNTER — Encounter: Payer: Self-pay | Admitting: General Surgery

## 2018-01-25 MED ORDER — HYDROCOD POLST-CPM POLST ER 10-8 MG/5ML PO SUER: 5.0000 mL | Freq: Two times a day (BID) | ORAL | 0 refills | Status: DC | PRN

## 2018-01-25 NOTE — Telephone Encounter (Signed)
Patient comes in with her husband c/o cough. Per Dr. Darnell Level, ok to refill Tussionex cough syrup. Call in 1 week if not improving.

## 2018-02-28 ENCOUNTER — Encounter: Payer: Self-pay | Admitting: Family Medicine

## 2018-02-28 ENCOUNTER — Ambulatory Visit (INDEPENDENT_AMBULATORY_CARE_PROVIDER_SITE_OTHER): Payer: PPO | Admitting: Family Medicine

## 2018-02-28 VITALS — BP 124/70 | HR 78 | Temp 98.4°F | Resp 16 | Wt 179.4 lb

## 2018-02-28 DIAGNOSIS — L237 Allergic contact dermatitis due to plants, except food: Secondary | ICD-10-CM | POA: Diagnosis not present

## 2018-02-28 MED ORDER — PREDNISONE 20 MG PO TABS: ORAL_TABLET | ORAL | 0 refills | Status: DC

## 2018-02-28 NOTE — Patient Instructions (Signed)
Let us know if new symptoms or not improving. 

## 2018-02-28 NOTE — Progress Notes (Signed)
Subjective:     Patient ID: Melissa [name], female   DOB: [birth year], 72 y.o.   MRN: [id number] Chief Complaint  Patient presents with  . Rash    Patient comes into office today with concerns of a rash that appearead on patients arms 6 days ago. Patient reports that she has been outside gardening and unsure if she was exposed to poison ivy. Patient reports redness, blisters, redness of sckin and itching. Patient has applied otc calodrilll and cortizone cream.    HPI States she was cutting bushes and putting them in a cart. Reports sensitivity to cedar as well. Reports she believe seh spread the reaction when she removed her clothes for the shower.  Review of Systems     Objective:   Physical Exam  Constitutional: She appears well-developed and well-nourished. No distress.  Skin:  Left anterior wrist with excoriated erythematous patch/ Left flank with a few discrete papules. Thighs not examined.       Assessment:    1. Allergic contact dermatitis due to plants, except food - predniSONE (DELTASONE) 20 MG tablet; Taper as follows: 3 pills for 4 days, two pills for 4 days, one pill for four days  Dispense: 24 tablet; Refill: 0    Plan:    Follow up if not improving.

## 2018-03-02 ENCOUNTER — Other Ambulatory Visit: Payer: Self-pay | Admitting: Family Medicine

## 2018-03-02 MED ORDER — PREDNISONE 10 MG PO TABS: ORAL_TABLET | ORAL | 0 refills | Status: DC

## 2018-03-07 ENCOUNTER — Ambulatory Visit (INDEPENDENT_AMBULATORY_CARE_PROVIDER_SITE_OTHER): Payer: PPO

## 2018-03-07 VITALS — BP 128/60 | HR 68 | Temp 98.2°F | Ht 63.0 in | Wt 180.4 lb

## 2018-03-07 DIAGNOSIS — Z0000 Encounter for general adult medical examination without abnormal findings: Secondary | ICD-10-CM

## 2018-03-07 NOTE — Patient Instructions (Signed)
Melissa [name] , Thank you for taking time to come for your Medicare Wellness Visit. I appreciate your ongoing commitment to your health goals. Please review the following plan we discussed and let me know if I can assist you in the future.   Screening recommendations/referrals: Colonoscopy: Up to date Mammogram: Up to date Bone Density: Up to date Recommended yearly ophthalmology/optometry visit for glaucoma screening and checkup Recommended yearly dental visit for hygiene and checkup  Vaccinations: Influenza vaccine: Up to date Pneumococcal vaccine: Up to date Tdap vaccine: Up to date Shingles vaccine: Pt declines today.     Advanced directives: Please bring a copy of your POA (Power of Attorney) and/or Living Will to your next appointment.   Conditions/risks identified: Continue drinking 8 glasses of water a day.   Next appointment: 03/22/18 @ 10:30 AM with Dr Rosanna Randy.    Preventive Care 2 Years and Older, Female Preventive care refers to lifestyle choices and visits with your health care provider that can promote health and wellness. What does preventive care include?  A yearly physical exam. This is also called an annual well check.  Dental exams once or twice a year.  Routine eye exams. Ask your health care provider how often you should have your eyes checked.  Personal lifestyle choices, including:  Daily care of your teeth and gums.  Regular physical activity.  Eating a healthy diet.  Avoiding tobacco and drug use.  Limiting alcohol use.  Practicing safe sex.  Taking low-dose aspirin every day.  Taking vitamin and mineral supplements as recommended by your health care provider. What happens during an annual well check? The services and screenings done by your health care provider during your annual well check will depend on your age, overall health, lifestyle risk factors, and family history of disease. Counseling  Your health care provider may ask you  questions about your:  Alcohol use.  Tobacco use.  Drug use.  Emotional well-being.  Home and relationship well-being.  Sexual activity.  Eating habits.  History of falls.  Memory and ability to understand (cognition).  Work and work Statistician.  Reproductive health. Screening  You may have the following tests or measurements:  Height, weight, and BMI.  Blood pressure.  Lipid and cholesterol levels. These may be checked every 5 years, or more frequently if you are over 26 years old.  Skin check.  Lung cancer screening. You may have this screening every year starting at age 3 if you have a 30-pack-year history of smoking and currently smoke or have quit within the past 15 years.  Fecal occult blood test (FOBT) of the stool. You may have this test every year starting at age 40.  Flexible sigmoidoscopy or colonoscopy. You may have a sigmoidoscopy every 5 years or a colonoscopy every 10 years starting at age 64.  Hepatitis C blood test.  Hepatitis B blood test.  Sexually transmitted disease (STD) testing.  Diabetes screening. This is done by checking your blood sugar (glucose) after you have not eaten for a while (fasting). You may have this done every 1-3 years.  Bone density scan. This is done to screen for osteoporosis. You may have this done starting at age 33.  Mammogram. This may be done every 1-2 years. Talk to your health care provider about how often you should have regular mammograms. Talk with your health care provider about your test results, treatment options, and if necessary, the need for more tests. Vaccines  Your health care provider may recommend  certain vaccines, such as:  Influenza vaccine. This is recommended every year.  Tetanus, diphtheria, and acellular pertussis (Tdap, Td) vaccine. You may need a Td booster every 10 years.  Zoster vaccine. You may need this after age 84.  Pneumococcal 13-valent conjugate (PCV13) vaccine. One dose is  recommended after age 109.  Pneumococcal polysaccharide (PPSV23) vaccine. One dose is recommended after age 11. Talk to your health care provider about which screenings and vaccines you need and how often you need them. This information is not intended to replace advice given to you by your health care provider. Make sure you discuss any questions you have with your health care provider. Document Released: 12/20/2015 Document Revised: 08/12/2016 Document Reviewed: 09/24/2015 Elsevier Interactive Patient Education  2017 [place] Prevention in the Home Falls can cause injuries. They can happen to people of all ages. There are many things you can do to make your home safe and to help prevent falls. What can I do on the outside of my home?  Regularly fix the edges of walkways and driveways and fix any cracks.  Remove anything that might make you trip as you walk through a door, such as a raised step or threshold.  Trim any bushes or trees on the path to your home.  Use bright outdoor lighting.  Clear any walking paths of anything that might make someone trip, such as rocks or tools.  Regularly check to see if handrails are loose or broken. Make sure that both sides of any steps have handrails.  Any raised decks and porches should have guardrails on the edges.  Have any leaves, snow, or ice cleared regularly.  Use sand or salt on walking paths during winter.  Clean up any spills in your garage right away. This includes oil or grease spills. What can I do in the bathroom?  Use night lights.  Install grab bars by the toilet and in the tub and shower. Do not use towel bars as grab bars.  Use non-skid mats or decals in the tub or shower.  If you need to sit down in the shower, use a plastic, non-slip stool.  Keep the floor dry. Clean up any water that spills on the floor as soon as it happens.  Remove soap buildup in the tub or shower regularly.  Attach bath mats  securely with double-sided non-slip rug tape.  Do not have throw rugs and other things on the floor that can make you trip. What can I do in the bedroom?  Use night lights.  Make sure that you have a light by your bed that is easy to reach.  Do not use any sheets or blankets that are too big for your bed. They should not hang down onto the floor.  Have a firm chair that has side arms. You can use this for support while you get dressed.  Do not have throw rugs and other things on the floor that can make you trip. What can I do in the kitchen?  Clean up any spills right away.  Avoid walking on wet floors.  Keep items that you use a lot in easy-to-reach places.  If you need to reach something above you, use a strong step stool that has a grab bar.  Keep electrical cords out of the way.  Do not use floor polish or wax that makes floors slippery. If you must use wax, use non-skid floor wax.  Do not have throw rugs and other  things on the floor that can make you trip. What can I do with my stairs?  Do not leave any items on the stairs.  Make sure that there are handrails on both sides of the stairs and use them. Fix handrails that are broken or loose. Make sure that handrails are as long as the stairways.  Check any carpeting to make sure that it is firmly attached to the stairs. Fix any carpet that is loose or worn.  Avoid having throw rugs at the top or bottom of the stairs. If you do have throw rugs, attach them to the floor with carpet tape.  Make sure that you have a light switch at the top of the stairs and the bottom of the stairs. If you do not have them, ask someone to add them for you. What else can I do to help prevent falls?  Wear shoes that:  Do not have high heels.  Have rubber bottoms.  Are comfortable and fit you well.  Are closed at the toe. Do not wear sandals.  If you use a stepladder:  Make sure that it is fully opened. Do not climb a closed  stepladder.  Make sure that both sides of the stepladder are locked into place.  Ask someone to hold it for you, if possible.  Clearly mark and make sure that you can see:  Any grab bars or handrails.  First and last steps.  Where the edge of each step is.  Use tools that help you move around (mobility aids) if they are needed. These include:  Canes.  Walkers.  Scooters.  Crutches.  Turn on the lights when you go into a dark area. Replace any light bulbs as soon as they burn out.  Set up your furniture so you have a clear path. Avoid moving your furniture around.  If any of your floors are uneven, fix them.  If there are any pets around you, be aware of where they are.  Review your medicines with your doctor. Some medicines can make you feel dizzy. This can increase your chance of falling. Ask your doctor what other things that you can do to help prevent falls. This information is not intended to replace advice given to you by your health care provider. Make sure you discuss any questions you have with your health care provider. Document Released: 09/19/2009 Document Revised: 04/30/2016 Document Reviewed: 12/28/2014 Elsevier Interactive Patient Education  2017 [employer].

## 2018-03-07 NOTE — Progress Notes (Signed)
Subjective:   Melissa [name] is a 72 y.o. female who presents for Medicare Annual (Subsequent) preventive examination.  Review of Systems:  N/A  Cardiac Risk Factors include: advanced age (>9men, >36 women);obesity (BMI >30kg/m2)     Objective:     Vitals: BP 128/60 (BP Location: Left Arm)   Pulse 68   Temp 98.2 F (36.8 C) (Oral)   Ht 5\' 3"  (1.6 m)   Wt 180 lb 6.4 oz (81.8 kg)   BMI 31.96 kg/m   Body mass index is 31.96 kg/m.  Advanced Directives 03/07/2018 01/05/2017 07/08/2016 02/10/2016  Does Patient Have a Medical Advance Directive? Yes No No No  Type of Advance Directive Living will - - -  Does patient want to make changes to medical advance directive? - Yes (MAU/Ambulatory/Procedural Areas - Information given) - -  Would patient like information on creating a medical advance directive? - - No - patient declined information -    Tobacco Social History   Tobacco Use  Smoking Status Never Smoker  Smokeless Tobacco Never Used     Counseling given: Not Answered   Clinical Intake:  Pre-visit preparation completed: Yes  Pain : No/denies pain Pain Score: 0-No pain     Nutritional Status: BMI > 30  Obese Nutritional Risks: None Diabetes: No  How often do you need to have someone help you when you read instructions, pamphlets, or other written materials from your doctor or pharmacy?: 1 - Never  Interpreter Needed?: No  Information entered by :: [facility], LPN  Past Medical History:  Diagnosis Date  . 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 Depression   . Diffuse cystic mastopathy 12/30/12  . Fibroid   . Fibromyalgia 2006  . Hemorrhoids 2006  . Hypothyroidism   . Migraines 2006  . Other nonspecific finding on examination of urine [id number]  . Tubular adenoma of colon 07/08/2016  . Vulvitis 11/22/2015   Past Surgical History:  Procedure Laterality Date  . ANAL MASS EXC  2014  . anal  ulcer  04/2016   SQUAMOUS MUCOSA WITH ACUTE ACTIVE ULCER AND REACTIVE HYPERPLASIA  . BREAST BIOPSY Bilateral    neg  . BREAST SURGERY  2005   BREAST REDUCTION SX Breast Biopsy (left) Neg for cancer  . COLONOSCOPY  2008, 2013  . COLONOSCOPY WITH PROPOFOL N/A 07/08/2016   tubular adenoma/ COLONOSCOPY WITH PROPOFOL;  Surgeon: Christene Lye, MD;  Location: [facility] ENDOSCOPY;  Service: Endoscopy;  Laterality: N/A;  . COSMETIC SURGERY  20008   FACIAL  . REDUCTION MAMMAPLASTY Bilateral 1998  . TONSILLECTOMY  2005  . VAGINAL HYSTERECTOMY  1986   Secondary to [place]   Family History  Problem Relation Age of Onset  . Diabetes Father   . Cancer Father   . Stroke Sister   . Heart disease Maternal Grandmother   . Breast cancer Paternal Aunt   . Ovarian cancer Neg Hx    Social History   Socioeconomic History  . Marital status: Married    Spouse name: Not on file  . Number of children: 2  . Years of education: Not on file  . Highest education level: Associate degree: occupational, Hotel manager, or vocational program  Occupational History  . Occupation: retired  Scientific laboratory technician  . Financial resource strain: Not hard at all  . Food insecurity:    Worry: Never true    Inability: Never true  . Transportation  needs:    Medical: No    Non-medical: No  Tobacco Use  . Smoking status: Never Smoker  . Smokeless tobacco: Never Used  Substance and Sexual Activity  . Alcohol use: No  . Drug use: No  . Sexual activity: Not Currently    Birth control/protection: Surgical  Lifestyle  . Physical activity:    Days per week: Not on file    Minutes per session: Not on file  . Stress: Not at all  Relationships  . Social connections:    Talks on phone: Not on file    Gets together: Not on file    Attends religious service: Not on file    Active member of club or organization: Not on file    Attends meetings of clubs or organizations: Not on file    Relationship status: Not on file  Other  Topics Concern  . Not on file  Social History Narrative  . Not on file    Outpatient Encounter Medications as of 03/07/2018  Medication Sig  . acetaminophen (TYLENOL) 650 MG CR tablet Take 650 mg by mouth daily as needed for pain.  . chlorpheniramine-HYDROcodone (TUSSIONEX PENNKINETIC ER) 10-8 MG/5ML SUER Take 5 mLs by mouth every 12 (twelve) hours as needed for cough.  . cholecalciferol (VITAMIN D) 1000 units tablet Take 1,000 Units by mouth daily.  . cholestyramine light 4 g POWD Take 1 packet (4 g total) by mouth 2 (two) times daily.  . clobetasol ointment (TEMOVATE) 6.94 % Apply 1 application topically 2 (two) times daily. Twice a day for 14 days then daily for 4 weeks  . Cyanocobalamin 1000 MCG/ML LIQD Take by mouth daily.  . diphenhydrAMINE (BENADRYL) 25 MG tablet Take 25 mg by mouth every 6 (six) hours as needed for itching.  . levothyroxine (SYNTHROID, LEVOTHROID) 175 MCG tablet TAKE 1 TABLET (175 MCG TOTAL) BY MOUTH DAILY BEFORE BREAKFAST.  Marland Kitchen loperamide (IMODIUM) 2 MG capsule Take 2 mg by mouth 4 (four) times daily as needed for diarrhea or loose stools.  . Magnesium 500 MG CAPS Take 1 capsule by mouth daily.  . Multiple Vitamins-Minerals (MULTIVITAL) tablet Take 1 tablet by mouth daily.  . nabumetone (RELAFEN) 500 MG tablet Take 1 tablet (500 mg total) by mouth 2 (two) times daily as needed.  . Omega-3 Fatty Acids (FISH OIL) 1200 MG CAPS Take by mouth.  . predniSONE (DELTASONE) 10 MG tablet Taper daily as follows: 6 pills for four days, 4 pills for four days, 2 pills for four days   No facility-administered encounter medications on file as of 03/07/2018.     Activities of Daily Living In your present state of health, do you have any difficulty performing the following activities: 03/07/2018  Hearing? N  Vision? N  Difficulty concentrating or making decisions? N  Walking or climbing stairs? N  Dressing or bathing? N  Doing errands, shopping? N  Preparing Food and eating ? N    Using the Toilet? N  In the past six months, have you accidently leaked urine? Y  Comment Occasionally, wears protection.  Do you have problems with loss of bowel control? Y  Comment Occasionally, due to medication from cancer tx. Unable to control bowels.   Managing your Medications? N  Managing your Finances? N  Housekeeping or managing your Housekeeping? N  Some recent data might be hidden    Patient Care Team: Jerrol Banana., MD as PCP - General (Unknown Physician Specialty) Defrancesco, Alanda Slim, MD as  Consulting Physician (Obstetrics and Gynecology) Thelma Comp, [place] as Consulting Physician (Optometry) Vevelyn Royals, MD as Consulting Physician (Ophthalmology) Bary Castilla Forest Gleason, MD as Consulting Physician (General Surgery)    Assessment:   This is a routine wellness examination for [place].  Exercise Activities and Dietary recommendations Current Exercise Habits: The patient does not participate in regular exercise at present, Exercise limited by: Other - see comments(Due to FM.)  Goals    . DIET - WATER INTAKE     Continue drinking 8 glasses of water a day.        Fall Risk Fall Risk  03/07/2018 01/05/2017 11/11/2015  Falls in the past year? No No No   Is the patient's home free of loose throw rugs in walkways, pet beds, electrical cords, etc?   yes      Grab bars in the bathroom? yes      Handrails on the stairs?   yes      Adequate lighting?   no  Timed Get Up and Go performed: N/A  Depression Screen PHQ 2/9 Scores 03/07/2018 03/07/2018 01/05/2017 11/11/2015  PHQ - 2 Score 0 0 0 0  PHQ- 9 Score 2 - - -     Cognitive Function: Pt declined screening today.      6CIT Screen 01/05/2017  What Year? 0 points  What month? 0 points  What time? 0 points  Count back from 20 0 points  Months in reverse 2 points  Repeat phrase 4 points  Total Score 6    Immunization History  Administered Date(s) Administered  . Influenza Split 09/16/2011, 09/20/2012   . Influenza, High Dose Seasonal PF 08/30/2014, 09/04/2015, 09/02/2016, 09/02/2017  . Influenza,inj,Quad PF,6+ Mos 09/14/2013  . Pneumococcal Conjugate-13 10/25/2014  . Pneumococcal Polysaccharide-23 04/09/2006, 12/10/2011  . Tdap 09/16/2011    Qualifies for Shingles Vaccine? Due for Shingles vaccine. Declined my offer to administer today. Education has been provided regarding the importance of this vaccine. Pt has been advised to call her insurance company to determine her out of pocket expense. Advised she may also receive this vaccine at her local pharmacy or Health Dept. Verbalized acceptance and understanding.  Screening Tests Health Maintenance  Topic Date Due  . INFLUENZA VACCINE  07/07/2018  . MAMMOGRAM  12/24/2019  . TETANUS/TDAP  09/15/2021  . COLONOSCOPY  07/08/2026  . DEXA SCAN  Completed  . Hepatitis C Screening  Completed  . PNA vac Low Risk Adult  Completed    Cancer Screenings: Lung: Low Dose CT Chest recommended if Age 71-80 years, 30 pack-year currently smoking OR have quit w/in 15years. Patient does qualify. Breast:  Up to date on Mammogram? Yes   Up to date of Bone Density/Dexa? Yes Colorectal: Up to date  Additional Screenings:  Hepatitis C Screening: Up to date     Plan:  I have personally reviewed and addressed the Medicare Annual Wellness questionnaire and have noted the following in the patient's chart:  A. Medical and social history B. Use of alcohol, tobacco or illicit drugs  C. Current medications and supplements D. Functional ability and status E.  Nutritional status F.  Physical activity G. Advance directives H. List of other physicians I.  Hospitalizations, surgeries, and ER visits in previous 12 months J.  [place] such as hearing and vision if needed, cognitive and depression L. Referrals and appointments - none  In addition, I have reviewed and discussed with patient certain preventive protocols, quality metrics, and best  practice recommendations. A written  personalized care plan for preventive services as well as general preventive health recommendations were provided to patient.  See attached scanned questionnaire for additional information.   Signed,  Fabio Neighbors, LPN Nurse Health Advisor   Nurse Recommendations: None.

## 2018-03-09 ENCOUNTER — Other Ambulatory Visit: Payer: Self-pay | Admitting: Family Medicine

## 2018-03-22 ENCOUNTER — Ambulatory Visit (INDEPENDENT_AMBULATORY_CARE_PROVIDER_SITE_OTHER): Payer: PPO | Admitting: Family Medicine

## 2018-03-22 ENCOUNTER — Encounter: Payer: Self-pay | Admitting: Family Medicine

## 2018-03-22 VITALS — BP 110/68 | HR 90 | Temp 98.0°F | Ht 63.0 in | Wt 176.4 lb

## 2018-03-22 DIAGNOSIS — Z13820 Encounter for screening for osteoporosis: Secondary | ICD-10-CM | POA: Diagnosis not present

## 2018-03-22 DIAGNOSIS — Z0000 Encounter for general adult medical examination without abnormal findings: Secondary | ICD-10-CM

## 2018-03-22 DIAGNOSIS — E039 Hypothyroidism, unspecified: Secondary | ICD-10-CM

## 2018-03-22 DIAGNOSIS — E782 Mixed hyperlipidemia: Secondary | ICD-10-CM

## 2018-03-22 NOTE — Progress Notes (Signed)
Patient: Melissa [name], Female    DOB: 26-Nov-1946, 72 y.o.   MRN: [id number] Visit Date: 03/22/2018  Today's Provider: Wilhemena Durie, MD   Chief Complaint  Patient presents with  . Annual Exam   Subjective:     Complete Physical Melissa [name] is a 72 y.o. female. She feels fairly well. She reports not exercising regularly, just yardwork . She reports she is sleeping well.  ----------------------------------------------------------- Patient had a AWE done on 03/07/18. Colonoscopy: 07/08/16- Decubitus ulceration around perianal. 1 polyp repeat in 5 years Mammogram: 12/23/17- Negative BMD: 08/02/13- Osteopenia  Pap: 07/02/08 s/p hysterectomy    Review of Systems  Constitutional: Positive for diaphoresis and fatigue.  HENT: Negative.   Eyes: Positive for photophobia.  Respiratory: Negative.   Cardiovascular: Negative.   Gastrointestinal: Negative.   Endocrine: Positive for heat intolerance.  Genitourinary: Positive for urgency.  Musculoskeletal: Positive for arthralgias and back pain.  Skin: Negative.   Allergic/Immunologic: Positive for environmental allergies.  Neurological: Negative.   Hematological: Bruises/bleeds easily.  Psychiatric/Behavioral: Negative.     Social History   Socioeconomic History  . Marital status: Married    Spouse name: Not on file  . Number of children: 2  . Years of education: Not on file  . Highest education level: Associate degree: occupational, Hotel manager, or vocational program  Occupational History  . Occupation: retired  Scientific laboratory technician  . Financial resource strain: Not hard at all  . Food insecurity:    Worry: Never true    Inability: Never true  . Transportation needs:    Medical: No    Non-medical: No  Tobacco Use  . Smoking status: Never Smoker  . Smokeless tobacco: Never Used  Substance and Sexual Activity  . Alcohol use: No  . Drug use: No  . Sexual activity: Not Currently    Birth  control/protection: Surgical  Lifestyle  . Physical activity:    Days per week: Not on file    Minutes per session: Not on file  . Stress: Not at all  Relationships  . Social connections:    Talks on phone: Not on file    Gets together: Not on file    Attends religious service: Not on file    Active member of club or organization: Not on file    Attends meetings of clubs or organizations: Not on file    Relationship status: Not on file  . Intimate partner violence:    Fear of current or ex partner: Not on file    Emotionally abused: Not on file    Physically abused: Not on file    Forced sexual activity: Not on file  Other Topics Concern  . Not on file  Social History Narrative  . Not on file    Past Medical History:  Diagnosis Date  . Cancer ([place]) 01/16/2004   anorectum chemo/ rad  . Carcinoma in situ of anal canal [id number]  . Depression   . Diffuse cystic mastopathy 12/30/12  . Fibroid   . Fibromyalgia 2006  . Hemorrhoids 2006  . Hypothyroidism   . Migraines 2006  . Other nonspecific finding on examination of urine [id number]  . Tubular adenoma of colon 07/08/2016  . Vulvitis 11/22/2015     Patient Active Problem List   Diagnosis Date Noted  . Diarrhea 01/01/2018  . Lichen sclerosus 09/30/8526  . Vulvitis 11/22/2015  . Atrophic vaginitis 04/11/2015  . CA of rectum ([place]) 04/11/2015  .  Chronic diarrhea 04/11/2015  . CD (contact dermatitis) 04/11/2015  . 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 Bloodgood disease 04/11/2015  . Fibrositis 04/11/2015  . Acid reflux 04/11/2015  . Acquired hypothyroidism 04/11/2015  . Hypersomnia 04/11/2015  . Internal hemorrhoids 04/11/2015  . Muscle ache 04/11/2015  . Neuralgia neuritis, sciatic nerve 04/11/2015  . Herpes zoster 04/11/2015  . Apnea, sleep 04/11/2015  . 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 Inflammation of urethra 04/11/2015  . 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 Fibromyalgia   . Diffuse cystic  mastopathy   . Migraines   . Other nonspecific finding on examination of urine   . ANKLE PAIN, RIGHT 10/01/2009  . 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 ANKLE SPRAIN, RIGHT 10/01/2009  . Hyperlipidemia, unspecified 11/22/2007  . OSTEOARTHRITIS 11/22/2007  . PLANTAR FASCIITIS, BILATERAL 11/22/2007  . COLON CANCER, HX OF 11/22/2007    Past Surgical History:  Procedure Laterality Date  . ANAL MASS EXC  2014  . anal ulcer  04/2016   SQUAMOUS MUCOSA WITH ACUTE ACTIVE ULCER AND REACTIVE HYPERPLASIA  . BREAST BIOPSY Bilateral    neg  . BREAST SURGERY  2005   BREAST REDUCTION SX Breast Biopsy (left) Neg for cancer  . COLONOSCOPY  2008, 2013  . COLONOSCOPY WITH PROPOFOL N/A 07/08/2016   tubular adenoma/ COLONOSCOPY WITH PROPOFOL;  Surgeon: Christene Lye, MD;  Location: [facility] ENDOSCOPY;  Service: Endoscopy;  Laterality: N/A;  . COSMETIC SURGERY  20008   FACIAL  . REDUCTION MAMMAPLASTY Bilateral 1998  . TONSILLECTOMY  2005  . VAGINAL HYSTERECTOMY  1986   Secondary to [place]    Her family history includes Breast cancer in her paternal aunt; Cancer in her father; Diabetes in her father; Heart disease in her maternal grandmother; Stroke in her sister. There is no history of Ovarian cancer.      Current Outpatient Medications:  .  acetaminophen (TYLENOL) 650 MG CR tablet, Take 650 mg by mouth daily as needed for pain., Disp: , Rfl:  .  cholecalciferol (VITAMIN D) 1000 units tablet, Take 1,000 Units by mouth daily., Disp: , Rfl:  .  cholestyramine (QUESTRAN) 4 g packet, Take 4 g by mouth 2 (two) times daily., Disp: , Rfl:  .  clobetasol ointment (TEMOVATE) 1.95 %, Apply 1 application topically 2 (two) times daily. Twice a day for 14 days then daily for 4 weeks, Disp: 30 g, Rfl: 3 .  Cyanocobalamin 1000 MCG/ML LIQD, Take by mouth daily., Disp: , Rfl:  .  diphenhydrAMINE (BENADRYL) 25 MG tablet, Take 25 mg by mouth every 6 (six) hours as needed for itching., Disp: , Rfl:  .   levothyroxine (SYNTHROID, LEVOTHROID) 175 MCG tablet, TAKE 1 TABLET (175 MCG TOTAL) BY MOUTH DAILY BEFORE BREAKFAST., Disp: 90 tablet, Rfl: 3 .  loperamide (IMODIUM) 2 MG capsule, Take 2 mg by mouth 4 (four) times daily as needed for diarrhea or loose stools., Disp: , Rfl:  .  Multiple Vitamins-Minerals (MULTIVITAL) tablet, Take 1 tablet by mouth daily., Disp: , Rfl:  .  nabumetone (RELAFEN) 500 MG tablet, Take 1 tablet (500 mg total) by mouth 2 (two) times daily as needed., Disp: 180 tablet, Rfl: 1 .  Omega-3 Fatty Acids (FISH OIL) 1200 MG CAPS, Take by mouth., Disp: , Rfl:  .  sertraline (ZOLOFT) 100 MG tablet, Take 100 mg by mouth 2 (two) times daily., Disp: , Rfl: 3 .  Magnesium 500 MG CAPS, Take 1 capsule by mouth daily.,  Disp: , Rfl:   Patient Care Team: Jerrol Banana., MD as PCP - General (Unknown Physician Specialty) Defrancesco, Alanda Slim, MD as Consulting Physician (Obstetrics and Gynecology) Thelma Comp, [place] as Consulting Physician (Optometry) Lucita Ferrara, Peterson Ao, MD as Consulting Physician (Ophthalmology) Bary Castilla Forest Gleason, MD as Consulting Physician (General Surgery)     Objective:   Vitals: BP 110/68 (BP Location: Right Arm, Patient Position: Sitting, Cuff Size: Normal)   Pulse 90   Temp 98 F (36.7 C) (Oral)   Ht 5\' 3"  (1.6 m)   Wt 176 lb 6.4 oz (80 kg)   SpO2 97%   BMI 31.25 kg/m   Physical Exam  Constitutional: She is oriented to person, place, and time. She appears well-developed and well-nourished.  HENT:  Head: Normocephalic and atraumatic.  Right Ear: External ear normal.  Left Ear: External ear normal.  Nose: Nose normal.  Mouth/Throat: Oropharynx is clear and moist.  Eyes: Pupils are equal, round, and reactive to light. Conjunctivae and EOM are normal.  Neck: Normal range of motion. Neck supple.  Cardiovascular: Normal rate, regular rhythm, normal heart sounds and intact distal pulses.  Pulmonary/Chest: Effort normal and breath sounds normal.    Abdominal: Soft. Bowel sounds are normal.  Musculoskeletal: Normal range of motion.  Neurological: She is alert and oriented to person, place, and time.  Skin: Skin is warm and dry.  Psychiatric: She has a normal mood and affect. Her behavior is normal. Thought content normal.    Activities of Daily Living In your present state of health, do you have any difficulty performing the following activities: 03/07/2018  Hearing? N  Vision? N  Difficulty concentrating or making decisions? N  Walking or climbing stairs? N  Dressing or bathing? N  Doing errands, shopping? N  Preparing Food and eating ? N  Using the Toilet? N  In the past six months, have you accidently leaked urine? Y  Comment Occasionally, wears protection.  Do you have problems with loss of bowel control? Y  Comment Occasionally, due to medication from cancer tx. Unable to control bowels.   Managing your Medications? N  Managing your Finances? N  Housekeeping or managing your Housekeeping? N  Some recent data might be hidden    Fall Risk Assessment Fall Risk  03/07/2018 01/05/2017 11/11/2015  Falls in the past year? No No No     Depression Screen PHQ 2/9 Scores 03/07/2018 03/07/2018 01/05/2017 11/11/2015  PHQ - 2 Score 0 0 0 0  PHQ- 9 Score 2 - - -    Assessment & Plan:    Annual Physical Reviewed patient's Family Medical History Reviewed and updated list of patient's medical providers Assessment of cognitive impairment was done Assessed patient's functional ability Established a written schedule for health screening [place] Completed and Reviewed  Exercise Activities and Dietary recommendations Goals    . DIET - WATER INTAKE     Continue drinking 8 glasses of water a day.        Immunization History  Administered Date(s) Administered  . Influenza Split 09/16/2011, 09/20/2012  . Influenza, High Dose Seasonal PF 08/30/2014, 09/04/2015, 09/02/2016, 09/02/2017  . Influenza,inj,Quad PF,6+  Mos 09/14/2013  . Pneumococcal Conjugate-13 10/25/2014  . Pneumococcal Polysaccharide-23 04/09/2006, 12/10/2011  . Tdap 09/16/2011    Health Maintenance  Topic Date Due  . INFLUENZA VACCINE  07/07/2018  . MAMMOGRAM  12/24/2019  . TETANUS/TDAP  09/15/2021  . COLONOSCOPY  07/08/2026  . DEXA SCAN  Completed  . Hepatitis  C Screening  Completed  . PNA vac Low Risk Adult  Completed    Gyn per Dr Enzo Bi. BMD. Discussed health benefits of physical activity, and encouraged her to engage in regular exercise appropriate for her age and condition.  H/o anal Cancer      ------------------------------------------------------------------------------------------------------------   I have done the exam and reviewed the above chart and it is accurate to the best of my knowledge. Development worker, community has been used in this note in any air is in the dictation or transcription are unintentional.  Wilhemena Durie, MD  [place]

## 2018-03-23 ENCOUNTER — Ambulatory Visit: Payer: PPO | Admitting: Obstetrics and Gynecology

## 2018-03-23 ENCOUNTER — Encounter: Payer: Self-pay | Admitting: Obstetrics and Gynecology

## 2018-03-23 VITALS — BP 117/70 | HR 101 | Ht 63.0 in | Wt 177.2 lb

## 2018-03-23 DIAGNOSIS — Z86004 Personal history of in-situ neoplasm of other and unspecified digestive organs: Secondary | ICD-10-CM

## 2018-03-23 DIAGNOSIS — Z86008 Personal history of in-situ neoplasm of other site: Secondary | ICD-10-CM | POA: Diagnosis not present

## 2018-03-23 DIAGNOSIS — L900 Lichen sclerosus et atrophicus: Secondary | ICD-10-CM | POA: Diagnosis not present

## 2018-03-23 NOTE — Patient Instructions (Signed)
1.  Continue using Temovate ointment to the vulva twice a week 2.  Return in 6 months for follow-up

## 2018-03-23 NOTE — Progress Notes (Signed)
Chief complaint: 1.  Lichen sclerosis 2.  History of carcinoma in situ of anal canal  Melissa [name] presents today for 60-month interval follow-up.  She has been using Temovate ointment 0.05% topically to the vulva and posterior fourchette for management of symptomatic lichen sclerosis. At this time she is asymptomatic.  She denies vulvar itching or burning. She also notes that her perianal ulcer from previously diagnosed carcinoma in situ of the anal canal has dramatically improved.  She is now seeing [name] for follow-up of this condition.  Bowel movements, one formed, have less of an impact on the previously noted perianal ulcer.  Past medical history, past surgical history, problem list, medications, and allergies are reviewed  OBJECTIVE: BP 117/70   Pulse (!) 101   Ht 5\' 3"  (1.6 m)   Wt 177 lb 3.2 oz (80.4 kg)   BMI 31.39 kg/m  Pleasant appearing female in no acute distress.  Alert and oriented. Abdomen: Soft, nontender Pelvic exam: External genitalia-notable for agglutination of labia minora to majora.  There is hypopigmentation on the right side which is not really consistent with leukoplakia; the posterior fourchette has minimal epithelial skin breakdown and there is a faint white plaque present. BUS-normal Vagina-decrease estrogen effect Rectovaginal-previously noted perianal ulcer has resolved; digital exam is not performed today  ASSESSMENT: 1.  Lichen sclerosis, clinically stable 2.  History of carcinoma in situ of anal canal, improving with resolution of previously noted ulcer  PLAN: 1.  Continue with Temovate ointment 0.05% topically twice a week to the vulva 2.  Return in 6 months for follow-up 3.  Continue with follow-up regarding perianal carcinoma in situ with Dr. Jeannette Corpus.  A total of 15 minutes were spent face-to-face with the patient during this encounter and over half of that time dealt with counseling and coordination of care.  Brayton Mars,  MD  Note: This dictation was prepared with Dragon dictation along with smaller phrase technology. Any transcriptional errors that result from this process are unintentional.

## 2018-03-28 DIAGNOSIS — E782 Mixed hyperlipidemia: Secondary | ICD-10-CM | POA: Diagnosis not present

## 2018-03-28 DIAGNOSIS — E039 Hypothyroidism, unspecified: Secondary | ICD-10-CM | POA: Diagnosis not present

## 2018-03-29 LAB — COMPREHENSIVE METABOLIC PANEL
A/G RATIO: 1.9 (ref 1.2–2.2)
ALK PHOS: 80 IU/L (ref 39–117)
ALT: 24 IU/L (ref 0–32)
AST: 16 IU/L (ref 0–40)
Albumin: 4.2 g/dL (ref 3.5–4.8)
BUN/Creatinine Ratio: 19 (ref 12–28)
BUN: 12 mg/dL (ref 8–27)
Bilirubin Total: <0.2 mg/dL (ref 0.0–1.2)
CALCIUM: 8.9 mg/dL (ref 8.7–10.3)
CO2: 22 mmol/L (ref 20–29)
Chloride: 108 mmol/L — ABNORMAL HIGH (ref 96–106)
Creatinine, Ser: 0.63 mg/dL (ref 0.57–1.00)
GFR calc Af Amer: 104 mL/min/{1.73_m2} (ref >59)
GFR, EST NON AFRICAN AMERICAN: 90 mL/min/{1.73_m2} (ref >59)
Globulin, Total: 2.2 g/dL (ref 1.5–4.5)
Glucose: 99 mg/dL (ref 65–99)
Potassium: 4.7 mmol/L (ref 3.5–5.2)
Sodium: 144 mmol/L (ref 134–144)
Total Protein: 6.4 g/dL (ref 6.0–8.5)

## 2018-03-29 LAB — CBC WITH DIFFERENTIAL/PLATELET
Basophils Absolute: 0 10*3/uL (ref 0.0–0.2)
Basos: 1 %
EOS (ABSOLUTE): 0.2 10*3/uL (ref 0.0–0.4)
EOS: 4 %
HEMATOCRIT: 37.4 % (ref 34.0–46.6)
HEMOGLOBIN: 12.2 g/dL (ref 11.1–15.9)
IMMATURE GRANS (ABS): 0 10*3/uL (ref 0.0–0.1)
IMMATURE GRANULOCYTES: 0 %
Lymphocytes Absolute: 1.6 10*3/uL (ref 0.7–3.1)
Lymphs: 42 %
MCH: 30.9 pg (ref 26.6–33.0)
MCHC: 32.6 g/dL (ref 31.5–35.7)
MCV: 95 fL (ref 79–97)
MONOCYTES: 10 %
Monocytes Absolute: 0.4 10*3/uL (ref 0.1–0.9)
NEUTROS PCT: 43 %
Neutrophils Absolute: 1.7 10*3/uL (ref 1.4–7.0)
Platelets: 256 10*3/uL (ref 150–379)
RBC: 3.95 x10E6/uL (ref 3.77–5.28)
RDW: 16.4 % — ABNORMAL HIGH (ref 12.3–15.4)
WBC: 3.9 10*3/uL (ref 3.4–10.8)

## 2018-03-29 LAB — LIPID PANEL
CHOLESTEROL TOTAL: 221 mg/dL — AB (ref 100–199)
Chol/HDL Ratio: 3.9 ratio (ref 0.0–4.4)
HDL: 57 mg/dL (ref >39)
LDL CALC: 109 mg/dL — AB (ref 0–99)
TRIGLYCERIDES: 273 mg/dL — AB (ref 0–149)
VLDL CHOLESTEROL CAL: 55 mg/dL — AB (ref 5–40)

## 2018-03-29 LAB — TSH: TSH: 5.02 u[IU]/mL — ABNORMAL HIGH (ref 0.450–4.500)

## 2018-03-30 DIAGNOSIS — H2511 Age-related nuclear cataract, right eye: Secondary | ICD-10-CM | POA: Diagnosis not present

## 2018-03-30 DIAGNOSIS — H18413 Arcus senilis, bilateral: Secondary | ICD-10-CM | POA: Diagnosis not present

## 2018-03-30 DIAGNOSIS — H2513 Age-related nuclear cataract, bilateral: Secondary | ICD-10-CM | POA: Diagnosis not present

## 2018-03-30 DIAGNOSIS — H25013 Cortical age-related cataract, bilateral: Secondary | ICD-10-CM | POA: Diagnosis not present

## 2018-03-30 DIAGNOSIS — H25043 Posterior subcapsular polar age-related cataract, bilateral: Secondary | ICD-10-CM | POA: Diagnosis not present

## 2018-04-28 ENCOUNTER — Ambulatory Visit
Admission: RE | Admit: 2018-04-28 | Discharge: 2018-04-28 | Disposition: A | Discharge status: Home / Self Care | Payer: PPO | Source: Ambulatory Visit | Attending: Family Medicine | Admitting: Family Medicine

## 2018-04-28 DIAGNOSIS — M85851 Other specified disorders of bone density and structure, right thigh: Secondary | ICD-10-CM | POA: Insufficient documentation

## 2018-04-28 DIAGNOSIS — Z780 Asymptomatic menopausal state: Secondary | ICD-10-CM | POA: Diagnosis not present

## 2018-04-28 DIAGNOSIS — Z13820 Encounter for screening for osteoporosis: Secondary | ICD-10-CM | POA: Diagnosis not present

## 2018-05-03 DIAGNOSIS — H2511 Age-related nuclear cataract, right eye: Secondary | ICD-10-CM | POA: Diagnosis not present

## 2018-05-03 DIAGNOSIS — H25811 Combined forms of age-related cataract, right eye: Secondary | ICD-10-CM | POA: Diagnosis not present

## 2018-05-10 DIAGNOSIS — H25812 Combined forms of age-related cataract, left eye: Secondary | ICD-10-CM | POA: Diagnosis not present

## 2018-05-10 DIAGNOSIS — H2512 Age-related nuclear cataract, left eye: Secondary | ICD-10-CM | POA: Diagnosis not present

## 2018-06-07 ENCOUNTER — Other Ambulatory Visit: Payer: Self-pay | Admitting: Family Medicine

## 2018-06-25 ENCOUNTER — Encounter: Payer: Self-pay | Admitting: Obstetrics and Gynecology

## 2018-06-25 ENCOUNTER — Other Ambulatory Visit: Payer: Self-pay | Admitting: Family Medicine

## 2018-08-30 ENCOUNTER — Ambulatory Visit
Admission: RE | Admit: 2018-08-30 | Discharge: 2018-08-30 | Disposition: A | Discharge status: Home / Self Care | Payer: PPO | Source: Ambulatory Visit | Attending: Family Medicine | Admitting: Family Medicine

## 2018-08-30 ENCOUNTER — Ambulatory Visit (INDEPENDENT_AMBULATORY_CARE_PROVIDER_SITE_OTHER): Payer: PPO | Admitting: Family Medicine

## 2018-08-30 VITALS — BP 114/62 | HR 96 | Temp 98.7°F | Resp 16 | Wt 178.0 lb

## 2018-08-30 DIAGNOSIS — M25561 Pain in right knee: Secondary | ICD-10-CM

## 2018-08-30 DIAGNOSIS — K121 Other forms of stomatitis: Secondary | ICD-10-CM

## 2018-08-30 DIAGNOSIS — M25551 Pain in right hip: Secondary | ICD-10-CM | POA: Diagnosis not present

## 2018-08-30 DIAGNOSIS — M1711 Unilateral primary osteoarthritis, right knee: Secondary | ICD-10-CM | POA: Diagnosis not present

## 2018-08-30 DIAGNOSIS — M26621 Arthralgia of right temporomandibular joint: Secondary | ICD-10-CM

## 2018-08-30 DIAGNOSIS — R197 Diarrhea, unspecified: Secondary | ICD-10-CM

## 2018-08-30 DIAGNOSIS — R682 Dry mouth, unspecified: Secondary | ICD-10-CM

## 2018-08-30 DIAGNOSIS — K909 Intestinal malabsorption, unspecified: Secondary | ICD-10-CM | POA: Diagnosis not present

## 2018-08-30 DIAGNOSIS — M5136 Other intervertebral disc degeneration, lumbar region: Secondary | ICD-10-CM | POA: Diagnosis not present

## 2018-08-30 MED ORDER — PREDNISONE 10 MG PO TABS: 10.0000 mg | ORAL_TABLET | Freq: Every day | ORAL | 0 refills | Status: DC

## 2018-08-30 NOTE — Progress Notes (Signed)
Melissa [name]  MRN: [id number] DOB: 03-Jan-1946  Subjective:  HPI   The patient is a 72 year old female who presents for evaluation of right hip and leg pain.  She states that she has fallen a few times over the last few weeks and on the second fall is when she hurt her hip.  About 1 month ago she was climbing into the truck and fall and then twisted her leg hurting her hip.   She has used Tylenol and gets relief but states she has to take too much to keep it from hurting.  She also complains of left sciatica pain on and off over the last 3 years.  After the fall she had an exacerbation of it for a few weeks but states now it is back to its normal state of pain.   The patient also complains of her mouth being dry and raw for several months, (since March).  She has tried some of the dry mouth products, sipping liquid frequently and using vaseline on her gums and lips with no relief.   Since march her lips feel dry and she has Right TMJ pain and rt tongue pain.   Patient Active Problem List   Diagnosis Date Noted  . Diarrhea 01/01/2018  . Lichen sclerosus 14/48/1856  . Vulvitis 11/22/2015  . Atrophic vaginitis 04/11/2015  . CA of rectum ([place]) 04/11/2015  . Chronic diarrhea 04/11/2015  . CD (contact dermatitis) 04/11/2015  . 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 Bloodgood disease 04/11/2015  . Fibrositis 04/11/2015  . Acid reflux 04/11/2015  . Acquired hypothyroidism 04/11/2015  . Hypersomnia 04/11/2015  . Internal hemorrhoids 04/11/2015  . Muscle ache 04/11/2015  . Neuralgia neuritis, sciatic nerve 04/11/2015  . Herpes zoster 04/11/2015  . Apnea, sleep 04/11/2015  . 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 Inflammation of urethra 04/11/2015  . 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 Fibromyalgia   . Diffuse cystic mastopathy   . Migraines   . Other nonspecific finding on examination of urine   . ANKLE PAIN, RIGHT 10/01/2009  . CAVUS DEFORMITY OF FOOT, ACQUIRED  10/01/2009  . ANKLE SPRAIN, RIGHT 10/01/2009  . Hyperlipidemia, unspecified 11/22/2007  . OSTEOARTHRITIS 11/22/2007  . PLANTAR FASCIITIS, BILATERAL 11/22/2007  . COLON CANCER, HX OF 11/22/2007    Past Medical History:  Diagnosis Date  . Cancer ([place]) 01/16/2004   anorectum chemo/ rad  . Carcinoma in situ of anal canal [id number]  . Depression   . Diffuse cystic mastopathy 12/30/12  . Fibroid   . Fibromyalgia 2006  . Hemorrhoids 2006  . Hypothyroidism   . Migraines 2006  . Other nonspecific finding on examination of urine [id number]  . Tubular adenoma of colon 07/08/2016  . Vulvitis 11/22/2015    Social History   Socioeconomic History  . Marital status: Married    Spouse name: Not on file  . Number of children: 2  . Years of education: Not on file  . Highest education level: Associate degree: occupational, Hotel manager, or vocational program  Occupational History  . Occupation: retired  Scientific laboratory technician  . Financial resource strain: Not hard at all  . Food insecurity:    Worry: Never true    Inability: Never true  . Transportation needs:    Medical: No    Non-medical: No  Tobacco Use  . Smoking status: Never Smoker  . Smokeless tobacco: Never Used  Substance and Sexual Activity  . Alcohol use: No  .  Drug use: No  . Sexual activity: Not Currently    Birth control/protection: Surgical  Lifestyle  . Physical activity:    Days per week: Not on file    Minutes per session: Not on file  . Stress: Not at all  Relationships  . Social connections:    Talks on phone: Not on file    Gets together: Not on file    Attends religious service: Not on file    Active member of club or organization: Not on file    Attends meetings of clubs or organizations: Not on file    Relationship status: Not on file  . Intimate partner violence:    Fear of current or ex partner: Not on file    Emotionally abused: Not on file    Physically abused: Not on file    Forced sexual activity: Not on  file  Other Topics Concern  . Not on file  Social History Narrative  . Not on file    Outpatient Encounter Medications as of 08/30/2018  Medication Sig  . acetaminophen (TYLENOL) 650 MG CR tablet Take 650 mg by mouth daily as needed for pain.  . cholecalciferol (VITAMIN D) 1000 units tablet Take 1,000 Units by mouth daily.  . cholestyramine (QUESTRAN) 4 g packet Take 4 g by mouth 2 (two) times daily.  . cholestyramine (QUESTRAN) 4 g packet DISSOLVE 1 PACKET (4 G TOTAL) IN LIQUID AND TAKE BY MOUTH 2 (TWO) TIMES DAILY.  . clobetasol ointment (TEMOVATE) 8.09 % Apply 1 application topically 2 (two) times daily. Twice a day for 14 days then daily for 4 weeks  . Cyanocobalamin 1000 MCG/ML LIQD Take by mouth daily.  . diphenhydrAMINE (BENADRYL) 25 MG tablet Take 25 mg by mouth every 6 (six) hours as needed for itching.  . levothyroxine (SYNTHROID, LEVOTHROID) 175 MCG tablet TAKE 1 TABLET (175 MCG TOTAL) BY MOUTH DAILY BEFORE BREAKFAST.  Marland Kitchen loperamide (IMODIUM) 2 MG capsule Take 2 mg by mouth 4 (four) times daily as needed for diarrhea or loose stools.  . Multiple Vitamins-Minerals (MULTIVITAL) tablet Take 1 tablet by mouth daily.  . nabumetone (RELAFEN) 500 MG tablet TAKE 1 TABLET BY MOUTH TWICE A DAY AS NEEDED  . Omega-3 Fatty Acids (FISH OIL) 1200 MG CAPS Take by mouth.  . sertraline (ZOLOFT) 100 MG tablet Take 100 mg by mouth 2 (two) times daily.   No facility-administered encounter medications on file as of 08/30/2018.     Allergies  Allergen Reactions  . Pentazocine Lactate   . Propoxyphene N-Acetaminophen   . Talwin  [Pentazocine]   . Tramadol Hcl   . Ciprofloxacin Hcl Rash  . Darvon  [Propoxyphene] Rash    Hallucinations Hallucinations.  . Dicloxacillin Rash  . Etodolac Rash    Review of Systems  Constitutional: Positive for malaise/fatigue. Negative for fever.  HENT:       See HPI. No jaw claudication.  Eyes: Negative.   Respiratory: Negative for cough, shortness of breath  and wheezing.   Cardiovascular: Positive for leg swelling. Negative for chest pain, palpitations, orthopnea and claudication.  Gastrointestinal: Negative.   Musculoskeletal: Positive for back pain, falls, joint pain, myalgias and neck pain.  Skin: Negative.   Neurological: Negative.   Endo/Heme/Allergies: Negative.   Psychiatric/Behavioral: Negative.     Objective:  BP 114/62 (BP Location: Right Arm, Patient Position: Sitting, Cuff Size: Normal)   Pulse 96   Temp 98.7 F (37.1 C) (Oral)   Resp 16   Wt 178  lb (80.7 kg)   SpO2 98%   BMI 31.53 kg/m   Physical Exam  Constitutional: She is oriented to person, place, and time and well-developed, well-nourished, and in no distress.  HENT:  Head: Normocephalic and atraumatic.  Right Ear: External ear normal.  Left Ear: External ear normal.  Nose: Nose normal.  Mouth/Throat: Oropharynx is clear and moist.  Eyes: Conjunctivae are normal. No scleral icterus.  Neck: No thyromegaly present.  Cardiovascular: Normal rate, regular rhythm and normal heart sounds.  Pulmonary/Chest: Effort normal and breath sounds normal.  Abdominal: Soft.  Musculoskeletal: She exhibits no edema.  Neurological: She is alert and oriented to person, place, and time. Gait normal. GCS score is 15.  Skin: Skin is warm and dry.  Psychiatric: Mood, memory, affect and judgment normal.    Assessment and Plan :  1. Pain of right hip joint  - DG Lumbar Spine Complete; Future - DG HIP UNILAT WITH PELVIS 2-3 VIEWS RIGHT; Future - DG Knee Complete 4 Views Right; Future - predniSONE (DELTASONE) 10 MG tablet; Take 1 tablet (10 mg total) by mouth daily with breakfast.  Dispense: 42 tablet; Refill: 0  2. Right knee pain, unspecified chronicity  - DG Lumbar Spine Complete; Future - DG HIP UNILAT WITH PELVIS 2-3 VIEWS RIGHT; Future - DG Knee Complete 4 Views Right; Future - predniSONE (DELTASONE) 10 MG tablet; Take 1 tablet (10 mg total) by mouth daily with breakfast.   Dispense: 42 tablet; Refill: 0  3. Mouth ulcer Tongue pain. - Ambulatory referral to ENT  4. Dry mouth  - Ambulatory referral to ENT 5.Right TMJ 6/h/o anal cancer 7.Diarrhea since radiation therapy Try probiotic daily. I have done the exam and reviewed the chart and it is accurate to the best of my knowledge. Development worker, community has been used and  any errors in dictation or transcription are unintentional. Miguel Aschoff M.D. [facility]

## 2018-08-30 NOTE — Patient Instructions (Signed)
Over the counter probiotic for diarrhea

## 2018-09-02 ENCOUNTER — Telehealth: Payer: Self-pay

## 2018-09-02 NOTE — Telephone Encounter (Signed)
-----   Message from Jerrol Banana., MD sent at 09/01/2018 12:27 PM EDT ----- Arthritc changes of knee.

## 2018-09-02 NOTE — Telephone Encounter (Signed)
Pt advised.   Thanks,   -Laura  

## 2018-09-02 NOTE — Telephone Encounter (Signed)
-----   Message from Jerrol Banana., MD sent at 09/01/2018 12:27 PM EDT ----- Odette Horns ok.

## 2018-09-02 NOTE — Telephone Encounter (Signed)
-----   Message from Jerrol Banana., MD sent at 09/01/2018 12:28 PM EDT ----- Some arthritic and DDD changes.

## 2018-09-08 ENCOUNTER — Other Ambulatory Visit: Payer: Self-pay | Admitting: Family Medicine

## 2018-09-09 DIAGNOSIS — R682 Dry mouth, unspecified: Secondary | ICD-10-CM | POA: Diagnosis not present

## 2018-09-09 DIAGNOSIS — K146 Glossodynia: Secondary | ICD-10-CM | POA: Diagnosis not present

## 2018-09-09 DIAGNOSIS — K1321 Leukoplakia of oral mucosa, including tongue: Secondary | ICD-10-CM | POA: Diagnosis not present

## 2018-09-14 ENCOUNTER — Ambulatory Visit: Payer: PPO | Admitting: Obstetrics and Gynecology

## 2018-09-14 ENCOUNTER — Encounter: Payer: Self-pay | Admitting: Obstetrics and Gynecology

## 2018-09-14 VITALS — BP 138/65 | HR 106 | Ht 63.0 in | Wt 178.6 lb

## 2018-09-14 DIAGNOSIS — L900 Lichen sclerosus et atrophicus: Secondary | ICD-10-CM | POA: Diagnosis not present

## 2018-09-14 DIAGNOSIS — K626 Ulcer of anus and rectum: Secondary | ICD-10-CM

## 2018-09-14 DIAGNOSIS — Z86004 Personal history of in-situ neoplasm of other and unspecified digestive organs: Secondary | ICD-10-CM | POA: Diagnosis not present

## 2018-09-14 NOTE — Progress Notes (Signed)
Chief complaint: 1.  Lichen sclerosis 2.  History of anal carcinoma in situ 3.  History of anal ulcer/rectal ulceration  36-month follow-up on lichen sclerosus. Patient has history of lichen sclerosus and history of anal carcinoma in situ; patient has history of surgical excision as well as radiation therapy.  She now has chronic ulceration.  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  She has not had any significant increased irritation with itching or burning in the region of the exterior fourchette; however she does intermittently develop perianal itching.  Currently she is applying the Temovate ointment twice weekly.  Melissa [name] is experiencing difficulties with bowel function in the form of chronic diarrhea.  She has gone on a dairy free diet and this is being managed by [name] from general surgery.  She also is experiencing intermittent abdominal spasms of uncertain etiology.  Past Medical History:  Diagnosis Date  . Cancer ([place]) 01/16/2004   anorectum chemo/ rad  . Carcinoma in situ of anal canal [id number]  . Depression   . Diffuse cystic mastopathy 12/30/12  . Dry mouth   . Fibroid   . Fibromyalgia 2006  . Hemorrhoids 2006  . Hypothyroidism   . Migraines 2006  . Other nonspecific finding on examination of urine [id number]  . Tubular adenoma of colon 07/08/2016  . Vulvitis 11/22/2015   Past Surgical History:  Procedure Laterality Date  . ANAL MASS EXC  2014  . anal ulcer  04/2016   SQUAMOUS MUCOSA WITH ACUTE ACTIVE ULCER AND REACTIVE HYPERPLASIA  . BREAST BIOPSY Bilateral    neg  . BREAST SURGERY  2005   BREAST REDUCTION SX Breast Biopsy (left) Neg for cancer  . CATARACT EXTRACTION    . COLONOSCOPY  2008, 2013  . COLONOSCOPY WITH PROPOFOL N/A 07/08/2016   tubular adenoma/ COLONOSCOPY WITH PROPOFOL;  Surgeon: Christene Lye, MD;   Location: [facility] ENDOSCOPY;  Service: Endoscopy;  Laterality: N/A;  . COSMETIC SURGERY  20008   FACIAL  . REDUCTION MAMMAPLASTY Bilateral 1998  . TONSILLECTOMY  2005  . VAGINAL HYSTERECTOMY  1986   Secondary to [place]   Review of systems: Complete review of systems is negative except for that noted in the HPI-specifically intermittent perianal itching  OBJECTIVE: BP 138/65   Pulse (!) 106   Ht 5\' 3"  (1.6 m)   Wt 178 lb 9.6 oz (81 kg)   BMI 31.64 kg/m  Pleasant well-appearing female in no acute distress.  She is alert and oriented.  Affect is appropriate. Abdomen: Soft, nontender; patient did experience an episode of upper abdominal spasm during her positioning for exam; the spasm lasted less than 30 seconds. Pelvic: External genitalia-agglutination of the labia minora to labia majora; hypopigmentation is noted on the right side (not consistent with leukoplakia); the posterior fourchette has leukoplakia in a vertical linear distribution measuring 3 mm x 7 mm-this is where she experiences occasional pruritus.  There is no epithelial skin breakdown BUS-normal Vagina-decreased estrogen effect; no discharge Rectovaginal-perianal ulcer is noted measuring approximately 5 x 10 mm at approximately the 4 o'clock position; margin of ulcer has leukoplakia present.  Good granulation is noted in the ulcer bed  ASSESSMENT: 1.  Lichen sclerosis, clinically stable 2.  History of carcinoma in situ of anal canal, intermittently symptomatic with chronic ulceration present measuring 5 x 10 mm  PLAN: 1.  Continue with Temovate ointment 0.05% topically  twice weekly to the posterior fourchette region and perianal region 2.  Return in 6 months for follow-up-Dr. Amalia Hailey 3.  Continue with follow-up regarding perianal carcinoma in situ with Dr. Jeannette Corpus in general surgery  A total of 15 minutes were spent face-to-face with the patient during this encounter and over half of that time dealt with counseling and  coordination of care.  Brayton Mars, MD  Note: This dictation was prepared with Dragon dictation along with smaller phrase technology. Any transcriptional errors that result from this process are unintentional.

## 2018-09-14 NOTE — Patient Instructions (Signed)
1.  Continue using Temovate ointment 0.05% topically to the posterior fourchette and perianal region for management of lichen sclerosus 2.  Return in 6 months for follow-up-Dr. Amalia Hailey 3.  Maintain annual follow-up with [name] in general surgery

## 2018-09-21 ENCOUNTER — Encounter: Payer: Self-pay | Admitting: Family Medicine

## 2018-09-21 ENCOUNTER — Ambulatory Visit (INDEPENDENT_AMBULATORY_CARE_PROVIDER_SITE_OTHER): Payer: PPO | Admitting: Family Medicine

## 2018-09-21 VITALS — BP 126/70 | HR 82 | Temp 98.2°F | Resp 16 | Ht 63.0 in | Wt 182.0 lb

## 2018-09-21 DIAGNOSIS — M76891 Other specified enthesopathies of right lower limb, excluding foot: Secondary | ICD-10-CM | POA: Diagnosis not present

## 2018-09-21 DIAGNOSIS — C20 Malignant neoplasm of rectum: Secondary | ICD-10-CM | POA: Diagnosis not present

## 2018-09-21 DIAGNOSIS — E039 Hypothyroidism, unspecified: Secondary | ICD-10-CM | POA: Diagnosis not present

## 2018-09-21 NOTE — Progress Notes (Signed)
Patient: Melissa [name] Female    DOB: 02/10/1946   72 y.o.   MRN: [id number] Visit Date: 09/21/2018  Today's Provider: Wilhemena Durie, MD   Chief Complaint  Patient presents with  . Hypothyroidism  . Hip Pain   Subjective:    HPI Patient comes in today for a follow up of chronic issues. She feels well today with minor complaints. On her last OV about 1 month ago, she was seen due to pain in her right hip. She was treated with a 6 day prednisone taper, but she reports that the pain is not any better.   Hypothyroidism- Patient is currently on levothyroxine 190mcg daily. She reports good compliance and good symptom control.  Lab Results  Component Value Date   TSH 5.020 (H) 03/28/2018       Allergies  Allergen Reactions  . Pentazocine Lactate   . Propoxyphene N-Acetaminophen   . Talwin  [Pentazocine]   . Tramadol Hcl   . Ciprofloxacin Hcl Rash  . Darvon  [Propoxyphene] Rash    Hallucinations Hallucinations.  . Dicloxacillin Rash  . Etodolac Rash     Current Outpatient Medications:  .  acetaminophen (TYLENOL) 650 MG CR tablet, Take 650 mg by mouth daily as needed for pain., Disp: , Rfl:  .  cholecalciferol (VITAMIN D) 1000 units tablet, Take 1,000 Units by mouth daily., Disp: , Rfl:  .  cholestyramine (QUESTRAN) 4 g packet, Take 4 g by mouth 2 (two) times daily., Disp: , Rfl:  .  cholestyramine (QUESTRAN) 4 g packet, DISSOLVE 1 PACKET (4 G TOTAL) IN LIQUID AND TAKE BY MOUTH 2 (TWO) TIMES DAILY., Disp: 180 packet, Rfl: 3 .  clobetasol ointment (TEMOVATE) 0.16 %, Apply 1 application topically 2 (two) times daily. Twice a day for 14 days then daily for 4 weeks, Disp: 30 g, Rfl: 3 .  Cyanocobalamin 1000 MCG/ML LIQD, Take by mouth daily., Disp: , Rfl:  .  dexamethasone (DECADRON) 0.5 MG/5ML solution, , Disp: , Rfl:  .  diphenhydrAMINE (BENADRYL) 25 MG tablet, Take 25 mg by mouth every 6 (six) hours as needed for itching., Disp: , Rfl:  .  levothyroxine  (SYNTHROID, LEVOTHROID) 175 MCG tablet, TAKE 1 TABLET (175 MCG TOTAL) BY MOUTH DAILY BEFORE BREAKFAST., Disp: 90 tablet, Rfl: 3 .  loperamide (IMODIUM) 2 MG capsule, Take 2 mg by mouth 4 (four) times daily as needed for diarrhea or loose stools., Disp: , Rfl:  .  Multiple Vitamins-Minerals (MULTIVITAL) tablet, Take 1 tablet by mouth daily., Disp: , Rfl:  .  nabumetone (RELAFEN) 500 MG tablet, TAKE 1 TABLET BY MOUTH TWICE A DAY AS NEEDED, Disp: 180 tablet, Rfl: 1 .  Omega-3 Fatty Acids (FISH OIL) 1200 MG CAPS, Take by mouth., Disp: , Rfl:  .  sertraline (ZOLOFT) 100 MG tablet, Take 100 mg by mouth 2 (two) times daily., Disp: , Rfl: 3 .  predniSONE (DELTASONE) 10 MG tablet, Take 1 tablet (10 mg total) by mouth daily with breakfast. (Patient not taking: Reported on 09/21/2018), Disp: 42 tablet, Rfl: 0  Review of Systems  Constitutional: Negative.   HENT: Negative.   Respiratory: Negative.   Cardiovascular: Negative.   Gastrointestinal: Negative.   Endocrine: Negative.   Genitourinary: Negative.   Musculoskeletal: Positive for arthralgias.  Allergic/Immunologic: Negative.     Social History   Tobacco Use  . Smoking status: Never Smoker  . Smokeless tobacco: Never Used  Substance Use Topics  . Alcohol use:  No   Objective:   BP 126/70   Pulse 82   Temp 98.2 F (36.8 C)   Resp 16   Ht 5\' 3"  (1.6 m)   Wt 182 lb (82.6 kg)   SpO2 98%   BMI 32.24 kg/m  Vitals:   09/21/18 1333  BP: 126/70  Pulse: 82  Resp: 16  Temp: 98.2 F (36.8 C)  SpO2: 98%  Weight: 182 lb (82.6 kg)  Height: 5\' 3"  (1.6 m)     Physical Exam  Constitutional: She is oriented to person, place, and time. She appears well-developed and well-nourished.  HENT:  Head: Normocephalic and atraumatic.  Eyes: Conjunctivae are normal. No scleral icterus.  Neck: No thyromegaly present.  Cardiovascular: Normal rate, regular rhythm and normal heart sounds.  Pulmonary/Chest: Effort normal and breath sounds normal.    Abdominal: Soft.  Musculoskeletal: She exhibits no edema, tenderness or deformity.  Neurological: She is alert and oriented to person, place, and time.  Skin: Skin is warm and dry.  Psychiatric: She has a normal mood and affect. Her behavior is normal. Judgment and thought content normal.        Assessment & Plan:      1. Acquired hypothyroidism  - TSH  2. 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  3. Hip flexor tendinitis, right Improved. May need PT vs ortho referral.  I have done the exam and reviewed the chart and it is accurate to the best of my knowledge. Development worker, community has [name] used and  any errors in dictation or transcription are unintentional. Miguel Aschoff M.D. [place], MD  [facility]

## 2018-09-22 ENCOUNTER — Telehealth: Payer: Self-pay

## 2018-09-22 LAB — TSH: TSH: 3.66 u[IU]/mL (ref 0.450–4.500)

## 2018-09-22 NOTE — Telephone Encounter (Signed)
-----   Message from Jerrol Banana., MD sent at 09/22/2018  8:42 AM EDT ----- TSH good.

## 2018-09-22 NOTE — Telephone Encounter (Signed)
Pt advised.   Thanks,   -Rober Skeels  

## 2018-09-30 DIAGNOSIS — K1321 Leukoplakia of oral mucosa, including tongue: Secondary | ICD-10-CM | POA: Diagnosis not present

## 2018-09-30 DIAGNOSIS — K1329 Other disturbances of oral epithelium, including tongue: Secondary | ICD-10-CM | POA: Diagnosis not present

## 2018-09-30 DIAGNOSIS — R682 Dry mouth, unspecified: Secondary | ICD-10-CM | POA: Diagnosis not present

## 2018-11-07 ENCOUNTER — Other Ambulatory Visit: Payer: Self-pay

## 2018-11-07 DIAGNOSIS — Z1231 Encounter for screening mammogram for malignant neoplasm of breast: Secondary | ICD-10-CM

## 2018-12-07 ENCOUNTER — Other Ambulatory Visit: Payer: Self-pay | Admitting: Family Medicine

## 2018-12-26 ENCOUNTER — Ambulatory Visit
Admission: RE | Admit: 2018-12-26 | Discharge: 2018-12-26 | Disposition: A | Discharge status: Home / Self Care | Payer: PPO | Source: Ambulatory Visit | Attending: General Surgery | Admitting: General Surgery

## 2018-12-26 DIAGNOSIS — Z1231 Encounter for screening mammogram for malignant neoplasm of breast: Secondary | ICD-10-CM

## 2018-12-27 ENCOUNTER — Telehealth: Payer: Self-pay | Admitting: *Deleted

## 2018-12-27 NOTE — Telephone Encounter (Signed)
Notified patient as instructed, patient pleased. Discussed follow-up appointments, patient agrees Will follow up with Dr. Rosana Hoes on 01/03/2019

## 2019-01-03 ENCOUNTER — Other Ambulatory Visit: Payer: Self-pay

## 2019-01-03 ENCOUNTER — Encounter: Payer: Self-pay | Admitting: General Surgery

## 2019-01-03 ENCOUNTER — Ambulatory Visit: Payer: PPO | Admitting: General Surgery

## 2019-01-03 VITALS — BP 146/78 | HR 91 | Temp 97.7°F | Resp 16 | Ht 63.0 in | Wt 175.0 lb

## 2019-01-03 DIAGNOSIS — K625 Hemorrhage of anus and rectum: Secondary | ICD-10-CM

## 2019-01-03 DIAGNOSIS — G8929 Other chronic pain: Secondary | ICD-10-CM | POA: Insufficient documentation

## 2019-01-03 DIAGNOSIS — C211 Malignant neoplasm of anal canal: Secondary | ICD-10-CM

## 2019-01-03 DIAGNOSIS — Z1231 Encounter for screening mammogram for malignant neoplasm of breast: Secondary | ICD-10-CM | POA: Diagnosis not present

## 2019-01-03 DIAGNOSIS — R1031 Right lower quadrant pain: Secondary | ICD-10-CM

## 2019-01-03 LAB — POC HEMOCCULT BLD/STL (OFFICE/1-CARD/DIAGNOSTIC)
Fecal Occult Blood, POC: NEGATIVE
OCCULT BLOOD DATE: NEGATIVE

## 2019-01-03 NOTE — Patient Instructions (Addendum)
The patient is aware to call back for any questions or new concerns.  Recommend seeing Dr Pryor Ochoa again regarding her dry mouth.

## 2019-01-03 NOTE — Progress Notes (Signed)
Patient ID: Melissa [name], female   DOB: [birth year], 73 y.o.   MRN: [id number]  Chief Complaint  Patient presents with  . Follow-up    one year f/u recall bil screening mammo [facility] 12/26/18, also f/u anal cancer    HPI Melissa [name] is a 73 y.o. female.  who presents for a breast evaluation. The most recent mammogram was done on 12-26-18 . No new breast issues. Patient does perform regular self breast checks and gets regular mammograms done.   She admits to mild rectal bleeding and itching. She is avoiding lactose and it seems to be helping with less diarrhea episodes. She does admit to sciatica nerve pain she is also having right lower quadrant abdominal pain for 3-4 days.  This has occurred 2-3 times per year for decades, although this episode has lasted somewhat longer and perhaps has been slightly more severe.  This is not been associate with nausea, vomiting or change in bowel habits.  HPI  Past Medical History:  Diagnosis Date  . Cancer ([place]) 01/16/2004   anorectum chemo/ rad  . Carcinoma in situ of anal canal [id number]  . Depression   . Diffuse cystic mastopathy 12/30/12  . Dry mouth   . Fibroid   . Fibromyalgia 2006  . Hemorrhoids 2006  . History of carcinoma in situ of anal canal 01/12/2003  . Hypothyroidism   . Migraines 2006  . Other nonspecific finding on examination of urine [id number]  . Sciatica   . Tubular adenoma of colon 07/08/2016  . Vulvitis 11/22/2015    Past Surgical History:  Procedure Laterality Date  . ANAL MASS EXC  2014  . anal ulcer  04/2016   SQUAMOUS MUCOSA WITH ACUTE ACTIVE ULCER AND REACTIVE HYPERPLASIA  . BREAST BIOPSY Bilateral    neg  . BREAST SURGERY  2005   BREAST REDUCTION SX Breast Biopsy (left) Neg for cancer  . CATARACT EXTRACTION    . COLONOSCOPY  2008, 2013  . COLONOSCOPY WITH PROPOFOL N/A 07/08/2016   tubular adenoma/ COLONOSCOPY WITH PROPOFOL;  Surgeon: Christene Lye, MD;  Location: [facility] ENDOSCOPY;   Service: Endoscopy;  Laterality: N/A;  . COSMETIC SURGERY  20008   FACIAL  . REDUCTION MAMMAPLASTY Bilateral 1998  . TONSILLECTOMY  2005  . VAGINAL HYSTERECTOMY  1986   Secondary to [place]    Family History  Problem Relation Age of Onset  . Diabetes Father   . Cancer Father   . Stroke Sister   . Heart disease Maternal Grandmother   . Breast cancer Paternal Aunt   . Ovarian cancer Neg Hx     Social History Social History   Tobacco Use  . Smoking status: Never Smoker  . Smokeless tobacco: Never Used  Substance Use Topics  . Alcohol use: No  . Drug use: No    Allergies  Allergen Reactions  . Pentazocine Lactate   . Propoxyphene N-Acetaminophen   . Talwin  [Pentazocine]   . Tramadol Hcl   . Ciprofloxacin Hcl Rash  . Darvon  [Propoxyphene] Rash    Hallucinations Hallucinations.  . Dicloxacillin Rash  . Etodolac Rash    Current Outpatient Medications  Medication Sig Dispense Refill  . acetaminophen (TYLENOL) 650 MG CR tablet Take 650 mg by mouth daily as needed for pain.    . cholecalciferol (VITAMIN D) 1000 units tablet Take 1,000 Units by mouth daily.    . cholestyramine (QUESTRAN) 4 g packet DISSOLVE 1 PACKET (4 G TOTAL) IN LIQUID  AND TAKE BY MOUTH 2 (TWO) TIMES DAILY. 180 packet 3  . clobetasol ointment (TEMOVATE) 4.12 % Apply 1 application topically 2 (two) times daily. Twice a day for 14 days then daily for 4 weeks 30 g 3  . Cyanocobalamin 1000 MCG/ML LIQD Take by mouth daily.    . diphenhydrAMINE (BENADRYL) 25 MG tablet Take 25 mg by mouth every 6 (six) hours as needed for itching.    . levothyroxine (SYNTHROID, LEVOTHROID) 175 MCG tablet TAKE 1 TABLET (175 MCG TOTAL) BY MOUTH DAILY BEFORE BREAKFAST. 90 tablet 3  . loperamide (IMODIUM) 2 MG capsule Take 2 mg by mouth 4 (four) times daily as needed for diarrhea or loose stools.    . Multiple Vitamins-Minerals (MULTIVITAL) tablet Take 1 tablet by mouth daily.    . nabumetone (RELAFEN) 500 MG tablet TAKE 1  TABLET BY MOUTH TWICE A DAY AS NEEDED 180 tablet 1  . Omega-3 Fatty Acids (FISH OIL) 1200 MG CAPS Take by mouth.    . sertraline (ZOLOFT) 100 MG tablet Take 100 mg by mouth 2 (two) times daily.  3   No current facility-administered medications for this visit.     Review of Systems Review of Systems  Constitutional: Negative.   Respiratory: Negative.   Cardiovascular: Negative.     Blood pressure (!) 146/78, pulse 91, temperature 97.7 F (36.5 C), temperature source Skin, resp. rate 16, height 5\' 3"  (1.6 m), weight 175 lb (79.4 kg), SpO2 95 %.  Physical Exam Physical Exam Exam conducted with a chaperone present.  Constitutional:      Appearance: Normal appearance.  Neck:     Musculoskeletal: Normal range of motion and neck supple.  Cardiovascular:     Rate and Rhythm: Normal rate and regular rhythm.  Pulmonary:     Effort: Pulmonary effort is normal.     Breath sounds: Normal breath sounds.  Chest:     Breasts:        Right: Normal.        Left: Normal.  Abdominal:     General: Bowel sounds are normal.     Palpations: Abdomen is soft. There is no hepatomegaly.     Tenderness: There is abdominal tenderness in the right lower quadrant. There is no guarding. Negative signs include Murphy's sign and McBurney's sign.    Genitourinary:    Rectum: Guaiac result negative.    Musculoskeletal: Normal range of motion.  Lymphadenopathy:     Lower Body: No right inguinal adenopathy. No left inguinal adenopathy.  Skin:    General: Skin is warm and dry.  Neurological:     General: No focal deficit present.     Mental Status: She is alert.       Data Reviewed CT scan of the abdomen and pelvis completed for lower abdominal pain dated December 25, 2014 showed no pathology in the right lower quadrant.  Suggested thickening of the gallbladder.  Abdominal ultrasound of January 04, 2015 was normal.  Bilateral screening mammograms dated December 26, 2018 were reviewed.  Near fatty  replaced breast.  BI-RADS-1.  Assessment    Benign breast exam.  Chronic, intermittent abdominal pain without clinical evidence of appendicitis.  Stable perianal ulcer dating back to 2004.    Plan    If the patient's abdominal symptoms worsen or are associated with nausea, vomiting, fever or chills she has been asked to call for early reassessment and will look at laboratory and repeat CT imaging at that time.  Reviewing the notes  of the 2016 exam the symptoms are fairly similar to what she is recently experienced.  Colonoscopy is up-to-date in 2017.  Repeat in 2022.  We will plan for a follow-up exam in 1 year with screening mammograms at that time.       Forest Gleason Tivon Lemoine 01/03/2019, 8:50 PM

## 2019-01-04 NOTE — Progress Notes (Signed)
Patient ID: Melissa [name], female   DOB: 1946/04/16, 73 y.o.   MRN: [id number]  Chief Complaint  Patient presents with  . Follow-up    one year f/u recall bil screening mammo [facility] 12/26/18, also f/u anal cancer    HPI Melissa [name] is a 73 y.o. female.  who presents for a breast evaluation. The most recent mammogram was done on 12-26-18 . No new breast issues. Patient does perform regular self breast checks and gets regular mammograms done.   She admits to mild rectal bleeding and itching. She is avoiding lactose and it seems to be helping with less diarrhea episodes. She does admit to sciatica nerve pain she is also having right lower quadrant abdominal pain for 3-4 days.  This has occurred 2-3 times per year for decades, although this episode has lasted somewhat longer and perhaps has been slightly more severe.  This is not been associate with nausea, vomiting or change in bowel habits.  HPI  Past Medical History:  Diagnosis Date  . Cancer ([place]) 01/16/2004   anorectum chemo/ rad  . Carcinoma in situ of anal canal [id number]  . Depression   . Diffuse cystic mastopathy 12/30/12  . Dry mouth   . Fibroid   . Fibromyalgia 2006  . Hemorrhoids 2006  . History of carcinoma in situ of anal canal 01/12/2003  . Hypothyroidism   . Migraines 2006  . Other nonspecific finding on examination of urine [id number]  . Sciatica   . Tubular adenoma of colon 07/08/2016  . Vulvitis 11/22/2015    Past Surgical History:  Procedure Laterality Date  . ANAL MASS EXC  2014  . anal ulcer  04/2016   SQUAMOUS MUCOSA WITH ACUTE ACTIVE ULCER AND REACTIVE HYPERPLASIA  . BREAST BIOPSY Bilateral    neg  . BREAST SURGERY  2005   BREAST REDUCTION SX Breast Biopsy (left) Neg for cancer  . CATARACT EXTRACTION    . COLONOSCOPY  2008, 2013  . COLONOSCOPY WITH PROPOFOL N/A 07/08/2016   tubular adenoma/ COLONOSCOPY WITH PROPOFOL;  Surgeon: Christene Lye, MD;  Location: [facility] ENDOSCOPY;   Service: Endoscopy;  Laterality: N/A;  . COSMETIC SURGERY  20008   FACIAL  . REDUCTION MAMMAPLASTY Bilateral 1998  . TONSILLECTOMY  2005  . VAGINAL HYSTERECTOMY  1986   Secondary to [place]    Family History  Problem Relation Age of Onset  . Diabetes Father   . Cancer Father   . Stroke Sister   . Heart disease Maternal Grandmother   . Breast cancer Paternal Aunt   . Ovarian cancer Neg Hx     Social History Social History   Tobacco Use  . Smoking status: Never Smoker  . Smokeless tobacco: Never Used  Substance Use Topics  . Alcohol use: No  . Drug use: No    Allergies  Allergen Reactions  . Pentazocine Lactate   . Propoxyphene N-Acetaminophen   . Talwin  [Pentazocine]   . Tramadol Hcl   . Ciprofloxacin Hcl Rash  . Darvon  [Propoxyphene] Rash    Hallucinations Hallucinations.  . Dicloxacillin Rash  . Etodolac Rash    Current Outpatient Medications  Medication Sig Dispense Refill  . acetaminophen (TYLENOL) 650 MG CR tablet Take 650 mg by mouth daily as needed for pain.    . cholecalciferol (VITAMIN D) 1000 units tablet Take 1,000 Units by mouth daily.    . cholestyramine (QUESTRAN) 4 g packet DISSOLVE 1 PACKET (4 G TOTAL) IN LIQUID  AND TAKE BY MOUTH 2 (TWO) TIMES DAILY. 180 packet 3  . clobetasol ointment (TEMOVATE) 6.76 % Apply 1 application topically 2 (two) times daily. Twice a day for 14 days then daily for 4 weeks 30 g 3  . Cyanocobalamin 1000 MCG/ML LIQD Take by mouth daily.    . diphenhydrAMINE (BENADRYL) 25 MG tablet Take 25 mg by mouth every 6 (six) hours as needed for itching.    . levothyroxine (SYNTHROID, LEVOTHROID) 175 MCG tablet TAKE 1 TABLET (175 MCG TOTAL) BY MOUTH DAILY BEFORE BREAKFAST. 90 tablet 3  . loperamide (IMODIUM) 2 MG capsule Take 2 mg by mouth 4 (four) times daily as needed for diarrhea or loose stools.    . Multiple Vitamins-Minerals (MULTIVITAL) tablet Take 1 tablet by mouth daily.    . nabumetone (RELAFEN) 500 MG tablet TAKE 1  TABLET BY MOUTH TWICE A DAY AS NEEDED 180 tablet 1  . Omega-3 Fatty Acids (FISH OIL) 1200 MG CAPS Take by mouth.    . sertraline (ZOLOFT) 100 MG tablet Take 100 mg by mouth 2 (two) times daily.  3   No current facility-administered medications for this visit.     Review of Systems Review of Systems  Constitutional: Negative.   Respiratory: Negative.   Cardiovascular: Negative.     Blood pressure (!) 146/78, pulse 91, temperature 97.7 F (36.5 C), temperature source Skin, resp. rate 16, height 5\' 3"  (1.6 m), weight 175 lb (79.4 kg), SpO2 95 %.  Physical Exam Physical Exam Exam conducted with a chaperone present.  Constitutional:      Appearance: Normal appearance.  Neck:     Musculoskeletal: Normal range of motion and neck supple.  Cardiovascular:     Rate and Rhythm: Normal rate and regular rhythm.  Pulmonary:     Effort: Pulmonary effort is normal.     Breath sounds: Normal breath sounds.  Chest:     Breasts:        Right: Normal.        Left: Normal.  Abdominal:     General: Bowel sounds are normal.     Palpations: Abdomen is soft. There is no hepatomegaly.     Tenderness: There is abdominal tenderness in the right lower quadrant. There is no guarding. Negative signs include Murphy's sign and McBurney's sign.    Genitourinary:    Rectum: Guaiac result negative.    Musculoskeletal: Normal range of motion.  Lymphadenopathy:     Lower Body: No right inguinal adenopathy. No left inguinal adenopathy.  Skin:    General: Skin is warm and dry.  Neurological:     General: No focal deficit present.     Mental Status: She is alert.       Data Reviewed CT scan of the abdomen and pelvis completed for lower abdominal pain dated December 25, 2014 showed no pathology in the right lower quadrant.  Suggested thickening of the gallbladder.  Abdominal ultrasound of January 04, 2015 was normal.  Bilateral screening mammograms dated December 26, 2018 were reviewed.  Near fatty  replaced breast.  BI-RADS-1.  Assessment    Benign breast exam.  Chronic, intermittent abdominal pain without clinical evidence of appendicitis.  Stable perianal ulcer dating back to 2004.    Plan    If the patient's abdominal symptoms worsen or are associated with nausea, vomiting, fever or chills she has been asked to call for early reassessment and will look at laboratory and repeat CT imaging at that time.  Reviewing the notes  of the 2016 exam the symptoms are fairly similar to what she is recently experienced.  Colonoscopy is up-to-date in 2017.  Repeat in 2022.  We will plan for a follow-up exam in 1 year with screening mammograms at that time.       Forest Gleason Krishna Dancel 01/03/2019, 8:50 PM

## 2019-01-30 ENCOUNTER — Encounter: Payer: Self-pay | Admitting: General Surgery

## 2019-02-28 ENCOUNTER — Other Ambulatory Visit: Payer: Self-pay | Admitting: Family Medicine

## 2019-03-09 ENCOUNTER — Ambulatory Visit: Payer: Self-pay

## 2019-03-16 ENCOUNTER — Encounter: Payer: PPO | Admitting: Obstetrics and Gynecology

## 2019-05-28 ENCOUNTER — Other Ambulatory Visit: Payer: Self-pay | Admitting: Family Medicine

## 2019-06-26 ENCOUNTER — Other Ambulatory Visit: Payer: Self-pay | Admitting: Family Medicine

## 2019-07-03 ENCOUNTER — Encounter: Payer: Self-pay | Admitting: General Surgery

## 2019-08-02 ENCOUNTER — Telehealth: Payer: Self-pay

## 2019-08-02 NOTE — Telephone Encounter (Signed)
LMTCB to schedule AWV that was cancelled earlier this year due to Covid. LM stating this can be completed over the phone now.

## 2019-08-22 NOTE — Telephone Encounter (Signed)
Scheduled telephonic AWV for 08/29/19 @ 9:20 AM.

## 2019-08-23 ENCOUNTER — Other Ambulatory Visit: Payer: Self-pay

## 2019-08-23 ENCOUNTER — Ambulatory Visit (INDEPENDENT_AMBULATORY_CARE_PROVIDER_SITE_OTHER): Payer: PPO

## 2019-08-23 DIAGNOSIS — Z23 Encounter for immunization: Secondary | ICD-10-CM

## 2019-08-26 ENCOUNTER — Other Ambulatory Visit: Payer: Self-pay | Admitting: Family Medicine

## 2019-08-28 NOTE — Progress Notes (Signed)
Duplicate/error

## 2019-08-29 ENCOUNTER — Other Ambulatory Visit: Payer: Self-pay

## 2019-08-29 ENCOUNTER — Ambulatory Visit (INDEPENDENT_AMBULATORY_CARE_PROVIDER_SITE_OTHER): Payer: PPO

## 2019-08-29 DIAGNOSIS — Z0000 Encounter for general adult medical examination without abnormal findings: Secondary | ICD-10-CM | POA: Diagnosis not present

## 2019-08-29 NOTE — Patient Instructions (Signed)
Melissa [name] , Thank you for taking time to come for your Medicare Wellness Visit. I appreciate your ongoing commitment to your health goals. Please review the following plan we discussed and let me know if I can assist you in the future.   Screening recommendations/referrals: Colonoscopy: Up to date, due 07/2021 Mammogram: Up to date, due 12/2020 Bone Density: Up to date, due 04/2023 Recommended yearly ophthalmology/optometry visit for glaucoma screening and checkup Recommended yearly dental visit for hygiene and checkup  Vaccinations: Influenza vaccine: Up to date Pneumococcal vaccine: Completed series Tdap vaccine: Up to date, due 09/2021 Shingles vaccine: Pt declines today.     Advanced directives: Advance directive discussed with you today. Even though you declined this today please call our office should you change your mind and we can give you the proper paperwork for you to fill out.  Conditions/risks identified: Recommend decreasing amount of salt used in diet and avoiding high sodium foods.  Next appointment: None. Pt to call back and schedule a follow up apt with Dr Rosanna Randy. Declined scheduling an AWV for 2021 at this time.    Preventive Care 74 Years and Older, Female Preventive care refers to lifestyle choices and visits with your health care provider that can promote health and wellness. What does preventive care include?  A yearly physical exam. This is also called an annual well check.  Dental exams once or twice a year.  Routine eye exams. Ask your health care provider how often you should have your eyes checked.  Personal lifestyle choices, including:  Daily care of your teeth and gums.  Regular physical activity.  Eating a healthy diet.  Avoiding tobacco and drug use.  Limiting alcohol use.  Practicing safe sex.  Taking low-dose aspirin every day.  Taking vitamin and mineral supplements as recommended by your health care provider. What happens during  an annual well check? The services and screenings done by your health care provider during your annual well check will depend on your age, overall health, lifestyle risk factors, and family history of disease. Counseling  Your health care provider may ask you questions about your:  Alcohol use.  Tobacco use.  Drug use.  Emotional well-being.  Home and relationship well-being.  Sexual activity.  Eating habits.  History of falls.  Memory and ability to understand (cognition).  Work and work Statistician.  Reproductive health. Screening  You may have the following tests or measurements:  Height, weight, and BMI.  Blood pressure.  Lipid and cholesterol levels. These may be checked every 5 years, or more frequently if you are over 60 years old.  Skin check.  Lung cancer screening. You may have this screening every year starting at age 74 if you have a 30-pack-year history of smoking and currently smoke or have quit within the past 15 years.  Fecal occult blood test (FOBT) of the stool. You may have this test every year starting at age 74.  Flexible sigmoidoscopy or colonoscopy. You may have a sigmoidoscopy every 5 years or a colonoscopy every 10 years starting at age 74.  Hepatitis C blood test.  Hepatitis B blood test.  Sexually transmitted disease (STD) testing.  Diabetes screening. This is done by checking your blood sugar (glucose) after you have not eaten for a while (fasting). You may have this done every 1-3 years.  Bone density scan. This is done to screen for osteoporosis. You may have this done starting at age 74.  Mammogram. This may be done every 1-2  years. Talk to your health care provider about how often you should have regular mammograms. Talk with your health care provider about your test results, treatment options, and if necessary, the need for more tests. Vaccines  Your health care provider may recommend certain vaccines, such as:  Influenza  vaccine. This is recommended every year.  Tetanus, diphtheria, and acellular pertussis (Tdap, Td) vaccine. You may need a Td booster every 10 years.  Zoster vaccine. You may need this after age 74.  Pneumococcal 13-valent conjugate (PCV13) vaccine. One dose is recommended after age 74.  Pneumococcal polysaccharide (PPSV23) vaccine. One dose is recommended after age 74. Talk to your health care provider about which screenings and vaccines you need and how often you need them. This information is not intended to replace advice given to you by your health care provider. Make sure you discuss any questions you have with your health care provider. Document Released: 12/20/2015 Document Revised: 08/12/2016 Document Reviewed: 09/24/2015 Elsevier Interactive Patient Education  2017 [place] Prevention in the Home Falls can cause injuries. They can happen to people of all ages. There are many things you can do to make your home safe and to help prevent falls. What can I do on the outside of my home?  Regularly fix the edges of walkways and driveways and fix any cracks.  Remove anything that might make you trip as you walk through a door, such as a raised step or threshold.  Trim any bushes or trees on the path to your home.  Use bright outdoor lighting.  Clear any walking paths of anything that might make someone trip, such as rocks or tools.  Regularly check to see if handrails are loose or broken. Make sure that both sides of any steps have handrails.  Any raised decks and porches should have guardrails on the edges.  Have any leaves, snow, or ice cleared regularly.  Use sand or salt on walking paths during winter.  Clean up any spills in your garage right away. This includes oil or grease spills. What can I do in the bathroom?  Use night lights.  Install grab bars by the toilet and in the tub and shower. Do not use towel bars as grab bars.  Use non-skid mats or decals  in the tub or shower.  If you need to sit down in the shower, use a plastic, non-slip stool.  Keep the floor dry. Clean up any water that spills on the floor as soon as it happens.  Remove soap buildup in the tub or shower regularly.  Attach bath mats securely with double-sided non-slip rug tape.  Do not have throw rugs and other things on the floor that can make you trip. What can I do in the bedroom?  Use night lights.  Make sure that you have a light by your bed that is easy to reach.  Do not use any sheets or blankets that are too big for your bed. They should not hang down onto the floor.  Have a firm chair that has side arms. You can use this for support while you get dressed.  Do not have throw rugs and other things on the floor that can make you trip. What can I do in the kitchen?  Clean up any spills right away.  Avoid walking on wet floors.  Keep items that you use a lot in easy-to-reach places.  If you need to reach something above you, use a strong step  stool that has a grab bar.  Keep electrical cords out of the way.  Do not use floor polish or wax that makes floors slippery. If you must use wax, use non-skid floor wax.  Do not have throw rugs and other things on the floor that can make you trip. What can I do with my stairs?  Do not leave any items on the stairs.  Make sure that there are handrails on both sides of the stairs and use them. Fix handrails that are broken or loose. Make sure that handrails are as long as the stairways.  Check any carpeting to make sure that it is firmly attached to the stairs. Fix any carpet that is loose or worn.  Avoid having throw rugs at the top or bottom of the stairs. If you do have throw rugs, attach them to the floor with carpet tape.  Make sure that you have a light switch at the top of the stairs and the bottom of the stairs. If you do not have them, ask someone to add them for you. What else can I do to help  prevent falls?  Wear shoes that:  Do not have high heels.  Have rubber bottoms.  Are comfortable and fit you well.  Are closed at the toe. Do not wear sandals.  If you use a stepladder:  Make sure that it is fully opened. Do not climb a closed stepladder.  Make sure that both sides of the stepladder are locked into place.  Ask someone to hold it for you, if possible.  Clearly mark and make sure that you can see:  Any grab bars or handrails.  First and last steps.  Where the edge of each step is.  Use tools that help you move around (mobility aids) if they are needed. These include:  Canes.  Walkers.  Scooters.  Crutches.  Turn on the lights when you go into a dark area. Replace any light bulbs as soon as they burn out.  Set up your furniture so you have a clear path. Avoid moving your furniture around.  If any of your floors are uneven, fix them.  If there are any pets around you, be aware of where they are.  Review your medicines with your doctor. Some medicines can make you feel dizzy. This can increase your chance of falling. Ask your doctor what other things that you can do to help prevent falls. This information is not intended to replace advice given to you by your health care provider. Make sure you discuss any questions you have with your health care provider. Document Released: 09/19/2009 Document Revised: 04/30/2016 Document Reviewed: 12/28/2014 Elsevier Interactive Patient Education  2017 [employer].

## 2019-08-29 NOTE — Progress Notes (Signed)
Subjective:   Melissa [name] is a 73 y.o. female who presents for Medicare Annual (Subsequent) preventive examination.    This visit is being conducted through telemedicine due to the COVID-19 pandemic. This patient has given me verbal consent via doximity to conduct this visit, patient states they are participating from their home address. Some vital signs may be absent or patient reported.    Patient identification: identified by name, DOB, and current address  Review of Systems:  N/A  Cardiac Risk Factors include: advanced age (>53men, >21 women);dyslipidemia     Objective:     Vitals: There were no vitals taken for this visit.  There is no height or weight on file to calculate BMI. Unable to obtain vitals due to visit being conducted via telephonically.   Advanced Directives 08/29/2019 03/07/2018 01/05/2017 07/08/2016 02/10/2016  Does Patient Have a Medical Advance Directive? No Yes No No No  Type of Advance Directive - Living will - - -  Does patient want to make changes to medical advance directive? - - Yes (MAU/Ambulatory/Procedural Areas - Information given) - -  Would patient like information on creating a medical advance directive? No - Patient declined - - No - patient declined information -    Tobacco Social History   Tobacco Use  Smoking Status Never Smoker  Smokeless Tobacco Never Used     Counseling given: Not Answered   Clinical Intake:  Pre-visit preparation completed: Yes  Pain : No/denies pain Pain Score: 0-No pain     Nutritional Risks: None Diabetes: No  How often do you need to have someone help you when you read instructions, pamphlets, or other written materials from your doctor or pharmacy?: 1 - Never  Interpreter Needed?: No  Information entered by :: [facility], LPN  Past Medical History:  Diagnosis Date  . 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  Chemotherapy and radiation.   . Depression   . Diffuse cystic mastopathy 12/30/12  . Dry mouth   . Fibroid   . Fibromyalgia 2006  . Hemorrhoids 2006  . Hypothyroidism   . Migraines 2006  . Other nonspecific finding on examination of urine [id number]  . Sciatica   . Tubular adenoma of colon 07/08/2016  . Vulvitis 11/22/2015   Past Surgical History:  Procedure Laterality Date  . ANAL MASS EXC  01/12/2003   Operative report described the nodular area of invasive squamous cell carcinoma anywhere from the 3-6 o'clock position multiple procedure notes  . anal ulcer  04/2016   SQUAMOUS MUCOSA WITH ACUTE ACTIVE ULCER AND REACTIVE HYPERPLASIA  . BREAST BIOPSY Bilateral    neg  . BREAST SURGERY  2005   BREAST REDUCTION SX Breast Biopsy (left) Neg for cancer  . CATARACT EXTRACTION    . COLONOSCOPY  2008, 2013  . COLONOSCOPY WITH PROPOFOL N/A 07/08/2016   tubular adenoma/ COLONOSCOPY WITH PROPOFOL;  Surgeon: Christene Lye, MD;  Location: [facility] ENDOSCOPY;  Service: Endoscopy;  Laterality: N/A;  . COSMETIC SURGERY  20008   FACIAL  . REDUCTION MAMMAPLASTY Bilateral 1998  . TONSILLECTOMY  2005  . VAGINAL HYSTERECTOMY  1986   Secondary to [place]   Family History  Problem Relation Age of Onset  . Diabetes Father   . Cancer Father   . Stroke Sister   . Heart disease Maternal Grandmother   . Breast cancer Paternal Aunt   . Ovarian cancer Neg Hx    Social History  Socioeconomic History  . Marital status: Married    Spouse name: Not on file  . Number of children: 2  . Years of education: Not on file  . Highest education level: Associate degree: occupational, Hotel manager, or vocational program  Occupational History  . Occupation: retired  Scientific laboratory technician  . Financial resource strain: Not hard at all  . Food insecurity    Worry: Never true    Inability: Never true  . Transportation needs    Medical: No    Non-medical: No  Tobacco Use  . Smoking status: Never Smoker  . Smokeless tobacco: Never Used   Substance and Sexual Activity  . Alcohol use: No  . Drug use: No  . Sexual activity: Not Currently    Birth control/protection: Surgical  Lifestyle  . Physical activity    Days per week: 0 days    Minutes per session: 0 min  . Stress: Not at all  Relationships  . Social Herbalist on phone: Patient refused    Gets together: Patient refused    Attends religious service: Patient refused    Active member of club or organization: Patient refused    Attends meetings of clubs or organizations: Patient refused    Relationship status: Patient refused  Other Topics Concern  . Not on file  Social History Narrative  . Not on file    Outpatient Encounter Medications as of 08/29/2019  Medication Sig  . Bioflavonoid Products (BIOFLEX PO) Take by mouth daily.  . cholecalciferol (VITAMIN D) 1000 units tablet Take 5,000 Units by mouth daily.   . cholestyramine (QUESTRAN) 4 g packet DISSOLVE 1 PACKET (4 G TOTAL) IN LIQUID AND TAKE BY MOUTH 2 (TWO) TIMES DAILY.  . diphenhydrAMINE (BENADRYL) 25 MG tablet Take 25 mg by mouth every 6 (six) hours as needed for itching.  . levothyroxine (SYNTHROID) 175 MCG tablet TAKE 1 TABLET (175 MCG TOTAL) BY MOUTH DAILY BEFORE BREAKFAST.  Marland Kitchen loperamide (IMODIUM) 2 MG capsule Take 2 mg by mouth 4 (four) times daily as needed for diarrhea or loose stools.  . Multiple Vitamins-Minerals (MULTIVITAL) tablet Take 1 tablet by mouth daily.  . nabumetone (RELAFEN) 500 MG tablet TAKE 1 TABLET BY MOUTH TWICE A DAY AS NEEDED  . Omega-3 Fatty Acids (FISH OIL) 1200 MG CAPS Take by mouth daily.   . Probiotic Product (DIGESTIVE ADVANTAGE PO) Take by mouth daily.  . sertraline (ZOLOFT) 100 MG tablet TAKE 1 TABLET BY MOUTH TWICE A DAY  . acetaminophen (TYLENOL) 650 MG CR tablet Take 650 mg by mouth daily as needed for pain.  . clobetasol ointment (TEMOVATE) [id number] % Apply 1 application topically 2 (two) times daily. Twice a day for 14 days then daily for 4 weeks (Patient not  taking: Reported on 08/29/2019)  . Cyanocobalamin 1000 MCG/ML LIQD Take by mouth daily.   No facility-administered encounter medications on file as of 08/29/2019.     Activities of Daily Living In your present state of health, do you have any difficulty performing the following activities: 08/29/2019  Hearing? N  Vision? N  Difficulty concentrating or making decisions? N  Walking or climbing stairs? N  Dressing or bathing? N  Doing errands, shopping? N  Preparing Food and eating ? N  Using the Toilet? N  In the past six months, have you accidently leaked urine? N  Do you have problems with loss of bowel control? Y  Comment Due to radiation from previous cancer. Is lactose intolerant.  Managing your Medications? N  Managing your Finances? N  Housekeeping or managing your Housekeeping? N  Some recent data might be hidden    Patient Care Team: Jerrol Banana., MD as PCP - General (Unknown Physician Specialty) Thelma Comp, Gr[place] as Consulting Physician (Optometry) Vevelyn Royals, MD as Consulting Physician (Ophthalmology)    Assessment:   This is a routine wellness examination for Melissa [name].  Exercise Activities and Dietary recommendations Current Exercise Habits: The patient does not participate in regular exercise at present, Exercise limited by: None identified  Goals    . DIET - REDUCE SODIUM INTAKE     Recommend to decrease amount of salt in diet and avoid high sodium foods.     . Increase water intake     Starting 01/05/17, I will continue to drink 6-8 glasses of water a day.       Fall Risk: Fall Risk  08/29/2019 01/03/2019 03/07/2018 01/05/2017 11/11/2015  Falls in the past year? 0 1 No No No  Number falls in past yr: 0 1 - - -  Injury with Fall? 0 0 - - -  Risk for fall due to : - History of fall(s) - - -  Follow up - Falls evaluation completed - - -    FALL RISK PREVENTION PERTAINING TO THE HOME:  Any stairs in or around the home? Yes  If so, are there  any without handrails? No   Home free of loose throw rugs in walkways, pet beds, electrical cords, etc? Yes  Adequate lighting in your home to reduce risk of falls? Yes   ASSISTIVE DEVICES UTILIZED TO PREVENT FALLS:  Life alert? No  Use of a cane, walker or w/c? No  Grab bars in the bathroom? No  Shower chair or bench in shower? Yes  Elevated toilet seat or a handicapped toilet? No    TIMED UP AND GO:  Was the test performed? No .    Depression Screen PHQ 2/9 Scores 08/29/2019 03/07/2018 03/07/2018 01/05/2017  PHQ - 2 Score 0 0 0 0  PHQ- 9 Score - 2 - -     Cognitive Function: Declined today.     6CIT Screen 01/05/2017  What Year? 0 points  What month? 0 points  What time? 0 points  Count back from 20 0 points  Months in reverse 2 points  Repeat phrase 4 points  Total Score 6    Immunization History  Administered Date(s) Administered  . Fluad Quad(high Dose 65+) 08/23/2019  . Influenza Split 09/16/2011, 09/20/2012  . Influenza, High Dose Seasonal PF 08/30/2014, 09/04/2015, 09/02/2016, 09/02/2017  . Influenza,inj,Quad PF,6+ Mos 09/14/2013  . Pneumococcal Conjugate-13 10/25/2014  . Pneumococcal Polysaccharide-23 04/09/2006, 12/10/2011  . Tdap 09/16/2011    Qualifies for Shingles Vaccine? Yes  Zostavax completed 08/25/11. Due for Shingrix. Pt has been advised to call insurance company to determine out of pocket expense. Advised may also receive vaccine at local pharmacy or Health Dept. Verbalized acceptance and understanding.  Tdap: Up to date  Flu Vaccine: Due for Flu vaccine. Does the patient want to receive this vaccine today?  No .   Pneumococcal Vaccine: Completed series  Screening Tests Health Maintenance  Topic Date Due  . MAMMOGRAM  12/26/2020  . COLONOSCOPY  07/08/2021  . TETANUS/TDAP  09/15/2021  . DEXA SCAN  04/29/2023  . INFLUENZA VACCINE  Completed  . Hepatitis C Screening  Completed  . PNA vac Low Risk Adult  Completed    Cancer Screenings:  Colorectal Screening: Completed 07/08/16. Repeat every 5 years.  Mammogram: Completed 12/26/18. Repeat every 1-2 years.   Bone Density: Completed 04/28/18. Results reflect OSTEOPENIA. Repeat every 5 years.   Lung Cancer Screening: (Low Dose CT Chest recommended if Age 32-80 years, 30 pack-year currently smoking OR have quit w/in 15years.) does not qualify.   Additional Screening:  Hepatitis C Screening: Up to date  Vision Screening: Recommended annual ophthalmology exams for early detection of glaucoma and other disorders of the eye.  Dental Screening: Recommended annual dental exams for proper oral hygiene  Community Resource Referral:  CRR required this visit?  No       Plan:  I have personally reviewed and addressed the Medicare Annual Wellness questionnaire and have noted the following in the patient's chart:  A. Medical and social history B. Use of alcohol, tobacco or illicit drugs  C. Current medications and supplements D. Functional ability and status E.  Nutritional status F.  Physical activity G. Advance directives H. List of other physicians I.  Hospitalizations, surgeries, and ER visits in previous 12 months J.  [place] such as hearing and vision if needed, cognitive and depression L. Referrals and appointments   In addition, I have reviewed and discussed with patient certain preventive protocols, quality metrics, and best practice recommendations. A written personalized care plan for preventive services as well as general preventive health recommendations were provided to patient. Nurse Health Advisor  Signed,    Costella Schwarz [place], [place]  [id number] Nurse Health Advisor   Nurse Notes: None.

## 2019-09-24 ENCOUNTER — Other Ambulatory Visit: Payer: Self-pay | Admitting: General Surgery

## 2019-09-24 DIAGNOSIS — N6019 Diffuse cystic mastopathy of unspecified breast: Secondary | ICD-10-CM

## 2019-11-27 ENCOUNTER — Other Ambulatory Visit: Payer: Self-pay | Admitting: Family Medicine

## 2019-11-27 NOTE — Telephone Encounter (Signed)
Please advise refill? Patient has appt with provider 12/28/2019.

## 2019-11-27 NOTE — Telephone Encounter (Signed)
Requested medication (s) are due for refill today: yes  Requested medication (s) are on the active medication list: yes  Last refill:  08/28/2019  Future visit scheduled: no  Notes to clinic:  overdue for office visit  Review for refill   Requested Prescriptions  Pending Prescriptions Disp Refills   nabumetone (RELAFEN) 500 MG tablet [Pharmacy Med Name: NABUMETONE 500 MG TABLET] 180 tablet 1    Sig: TAKE 1 TABLET BY MOUTH TWICE A DAY AS NEEDED      Analgesics:  NSAIDS Failed - 11/27/2019  1:10 AM      Failed - Cr in normal range and within 360 days    Creatinine  Date Value Ref Range Status  06/28/2013 1.00 0.60 - 1.30 mg/dL Final   Creatinine, Ser  Date Value Ref Range Status  03/28/2018 0.63 0.57 - 1.00 mg/dL Final          Failed - HGB in normal range and within 360 days    Hemoglobin  Date Value Ref Range Status  03/28/2018 12.2 11.1 - 15.9 g/dL Final          Failed - Valid encounter within last 12 months    Recent Outpatient Visits           1 year ago Acquired hypothyroidism   [facility] Jerrol Banana., MD   1 year ago Pain of right hip joint   [facility] Jerrol Banana., MD   1 year ago Annual physical exam   [facility] Jerrol Banana., MD   1 year ago Allergic contact dermatitis due to plants, except food   [place], [place]   2 years ago Annual physical exam   [facility] Jerrol Banana., MD              Passed - Patient is not pregnant

## 2019-12-06 ENCOUNTER — Other Ambulatory Visit: Payer: Self-pay

## 2019-12-06 ENCOUNTER — Encounter: Payer: Self-pay | Admitting: Obstetrics and Gynecology

## 2019-12-06 ENCOUNTER — Ambulatory Visit (INDEPENDENT_AMBULATORY_CARE_PROVIDER_SITE_OTHER): Payer: PPO | Admitting: Obstetrics and Gynecology

## 2019-12-06 VITALS — BP 124/65 | HR 78 | Ht 63.0 in | Wt 175.4 lb

## 2019-12-06 DIAGNOSIS — N952 Postmenopausal atrophic vaginitis: Secondary | ICD-10-CM

## 2019-12-06 DIAGNOSIS — L900 Lichen sclerosus et atrophicus: Secondary | ICD-10-CM

## 2019-12-06 MED ORDER — ESTRADIOL 0.1 MG/GM VA CREA: 0.3300 | TOPICAL_CREAM | VAGINAL | 1 refills | Status: DC

## 2019-12-06 NOTE — Progress Notes (Signed)
HPI:      [name] is a 73 y.o. [id number] who LMP was No LMP recorded. Patient has had a hysterectomy.  Subjective:   She presents today for follow-up of lichen sclerosus.  She also complains significant vaginal dryness and has pain with intercourse.  She would like to consider something to improve her vaginal health and possibly resume intercourse.  She has used clobetasol in the past but is not currently using it.  She has no itching or other symptoms at this time. 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  She still has some symptoms remaining from the surgery and radiation.    Hx: The following portions of the patient's history were reviewed and updated as appropriate:             She  has a past medical history of Anal cancer ([place]) (01/12/2003), Depression, Diffuse cystic mastopathy (12/30/12), Dry mouth, Fibroid, Fibromyalgia (2006), Hemorrhoids (2006), Hypothyroidism, Migraines (2006), Other nonspecific finding on examination of urine ([id number]), Sciatica, Tubular adenoma of colon (07/08/2016), and Vulvitis (11/22/2015). She does not have any pertinent problems on file. She  has a past surgical history that includes Colonoscopy (2008, 2013); Tonsillectomy (2005); ANAL MASS EXC (01/12/2003); Breast surgery (2005); Cosmetic surgery (20008); Vaginal hysterectomy (1986); Colonoscopy with propofol (N/A, 07/08/2016); anal ulcer (04/2016); Cataract extraction; Breast biopsy (Bilateral); and Reduction mammaplasty (Bilateral, 1998). Her family history includes Breast cancer in her paternal aunt; Cancer in her father; Diabetes in her father; Heart disease in her maternal grandmother; Stroke in her sister. She  reports that she has never smoked. She has never used smokeless tobacco. She reports that she does not drink alcohol or use drugs. She has a current medication list which includes the following prescription(s): acetaminophen, bioflavonoid products,  cholecalciferol, cholestyramine, clobetasol ointment, cyanocobalamin, diphenhydramine, levothyroxine, loperamide, multivital, nabumetone, fish oil, probiotic product, sertraline, and estradiol. She is allergic to pentazocine lactate; propoxyphene n-acetaminophen; talwin  [pentazocine]; tramadol hcl; ciprofloxacin hcl; darvon  [propoxyphene]; dicloxacillin; and etodolac.       Review of Systems:  Review of Systems  Constitutional: Denied constitutional symptoms, night sweats, recent illness, fatigue, fever, insomnia and weight loss.  Eyes: Denied eye symptoms, eye pain, photophobia, vision change and visual disturbance.  Ears/Nose/Throat/Neck: Denied ear, nose, throat or neck symptoms, hearing loss, nasal discharge, sinus congestion and sore throat.  Cardiovascular: Denied cardiovascular symptoms, arrhythmia, chest pain/pressure, edema, exercise intolerance, orthopnea and palpitations.  Respiratory: Denied pulmonary symptoms, asthma, pleuritic pain, productive sputum, cough, dyspnea and wheezing.  Gastrointestinal: Denied, gastro-esophageal reflux, melena, nausea and vomiting.  Genitourinary: See HPI for additional information.  Musculoskeletal: Denied musculoskeletal symptoms, stiffness, swelling, muscle weakness and myalgia.  Dermatologic: Denied dermatology symptoms, rash and scar.  Neurologic: Denied neurology symptoms, dizziness, headache, neck pain and syncope.  Psychiatric: Denied psychiatric symptoms, anxiety and depression.  Endocrine: Denied endocrine symptoms including hot flashes and night sweats.   Meds:   Current Outpatient Medications on File Prior to Visit  Medication Sig Dispense Refill  . acetaminophen (TYLENOL) 650 MG CR tablet Take 650 mg by mouth daily as needed for pain.    Marland Kitchen Bioflavonoid Products (BIOFLEX PO) Take by mouth daily.    . cholecalciferol (VITAMIN D) 1000 units tablet Take 5,000 Units by mouth daily.     . cholestyramine (QUESTRAN) 4 g packet DISSOLVE 1  PACKET (4 G TOTAL) IN LIQUID AND TAKE BY MOUTH 2 (TWO) TIMES DAILY. 180 packet 3  . clobetasol ointment (  TEMOVATE) [id number] % Apply 1 application topically 2 (two) times daily. Twice a day for 14 days then daily for 4 weeks 30 g 3  . Cyanocobalamin 1000 MCG/ML LIQD Take by mouth daily.    . diphenhydrAMINE (BENADRYL) 25 MG tablet Take 25 mg by mouth every 6 (six) hours as needed for itching.    . levothyroxine (SYNTHROID) 175 MCG tablet TAKE 1 TABLET (175 MCG TOTAL) BY MOUTH DAILY BEFORE BREAKFAST. 90 tablet 3  . loperamide (IMODIUM) 2 MG capsule Take 2 mg by mouth 4 (four) times daily as needed for diarrhea or loose stools.    . Multiple Vitamins-Minerals (MULTIVITAL) tablet Take 1 tablet by mouth daily.    . nabumetone (RELAFEN) 500 MG tablet TAKE 1 TABLET BY MOUTH TWICE A DAY AS NEEDED 180 tablet 1  . Omega-3 Fatty Acids (FISH OIL) 1200 MG CAPS Take by mouth daily.     . Probiotic Product (DIGESTIVE ADVANTAGE PO) Take by mouth daily.    . sertraline (ZOLOFT) 100 MG tablet TAKE 1 TABLET BY MOUTH TWICE A DAY 180 tablet 3   No current facility-administered medications on file prior to visit.    Objective:     Vitals:   12/06/19 1053  BP: 124/65  Pulse: 78                 Pelvic:   Vulva:  Lichen sclerosus right labia greater than left posterior fourchette.  No evidence of fusion but there is loss of architecture at this time  Vagina: No lesions or abnormalities noted.  Significant vaginal atrophy  Support: Normal pelvic support.  Urethra No masses tenderness or scarring.  Meatus Normal size without lesions or prolapse  Vag Cuff: Intact.  No lesions.  Anus: Normal exam.  No lesions.  Perineum: Normal exam.  No lesions.        Bimanual   Adnexae: No masses.  Non-tender to palpation.  Cuff: Negative for abnormality.      Assessment:    [id number] Patient Active Problem List   Diagnosis Date Noted  . Abdominal pain, chronic, right lower quadrant 01/03/2019  . Diarrhea 01/01/2018   . Lichen sclerosus [id number]  . Vulvitis 11/22/2015  . Atrophic vaginitis 04/11/2015  . Chronic diarrhea 04/11/2015  . CD (contact dermatitis) 04/11/2015  . 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 Bloodgood disease 04/11/2015  . Fibrositis 04/11/2015  . Acid reflux 04/11/2015  . Acquired hypothyroidism 04/11/2015  . Hypersomnia 04/11/2015  . Internal hemorrhoids 04/11/2015  . Muscle ache 04/11/2015  . Neuralgia neuritis, sciatic nerve 04/11/2015  . Herpes zoster 04/11/2015  . Apnea, sleep 04/11/2015  . 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 Inflammation of urethra 04/11/2015  . Fibromyalgia   . Diffuse cystic mastopathy   . Migraines   . Other nonspecific finding on examination of urine   . ANKLE PAIN, RIGHT 10/01/2009  . 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 ANKLE SPRAIN, RIGHT 10/01/2009  . Hyperlipidemia, unspecified 11/22/2007  . OSTEOARTHRITIS 11/22/2007  . PLANTAR FASCIITIS, BILATERAL 11/22/2007  . 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 Lichen sclerosus   2. Atrophic vaginitis        Plan:            1.  Patient will resume use of twice weekly clobetasol-instructed in its use  2.  QOD use of Estrace vaginal cream as directed for atrophic vaginitis Orders No orders of the defined types were placed in this encounter.  Meds ordered this encounter  Medications  . estradiol (ESTRACE) 0.1 MG/GM vaginal cream    Sig: Place [id number] Applicatorfuls vaginally every other day. 1/3 applicator every other night    Dispense:  90 g    Refill:  1      F/U  Return in about 2 months (around 02/04/2020). I spent 18 minutes involved in the care of this patient of which greater than 50% was spent discussing lichen sclerosus, atrophic vaginitis, effect of estrogen cream, effect of clobetasol natural course and history of atrophic vaginitis and lichen sclerosis.  All questions answered.  Finis Bud, M.D. 12/06/2019 11:20 AM

## 2019-12-11 ENCOUNTER — Other Ambulatory Visit: Payer: Self-pay | Admitting: Surgical

## 2019-12-11 DIAGNOSIS — N952 Postmenopausal atrophic vaginitis: Secondary | ICD-10-CM

## 2019-12-11 MED ORDER — ESTRADIOL 0.1 MG/GM VA CREA: TOPICAL_CREAM | VAGINAL | 1 refills | Status: DC

## 2019-12-20 ENCOUNTER — Other Ambulatory Visit: Payer: Self-pay

## 2019-12-28 ENCOUNTER — Ambulatory Visit
Admission: RE | Admit: 2019-12-28 | Discharge: 2019-12-28 | Disposition: A | Discharge status: Home / Self Care | Payer: PPO | Source: Ambulatory Visit | Attending: General Surgery | Admitting: General Surgery

## 2019-12-28 DIAGNOSIS — N6019 Diffuse cystic mastopathy of unspecified breast: Secondary | ICD-10-CM | POA: Insufficient documentation

## 2019-12-28 DIAGNOSIS — Z1231 Encounter for screening mammogram for malignant neoplasm of breast: Secondary | ICD-10-CM | POA: Insufficient documentation

## 2019-12-28 HISTORY — DX: Personal history of antineoplastic chemotherapy: Z92.21

## 2019-12-28 HISTORY — DX: Personal history of irradiation: Z92.3

## 2020-01-04 DIAGNOSIS — C211 Malignant neoplasm of anal canal: Secondary | ICD-10-CM | POA: Diagnosis not present

## 2020-01-19 ENCOUNTER — Ambulatory Visit: Payer: PPO | Attending: Internal Medicine

## 2020-01-19 DIAGNOSIS — Z23 Encounter for immunization: Secondary | ICD-10-CM

## 2020-01-19 NOTE — Progress Notes (Signed)
   Covid-19 Vaccination Clinic  Name:  Melissa [name]    MRN: [id number] DOB: 01-18-1946  01/19/2020  [name] was observed post Covid-19 immunization for 15 minutes without incidence. She was provided with Vaccine Information Sheet and instruction to access the V-Safe system.   [name] was instructed to call 911 with any severe reactions post vaccine: Marland Kitchen Difficulty breathing  . Swelling of your face and throat  . A fast heartbeat  . A bad rash all over your body  . Dizziness and weakness    Immunizations Administered    Name Date Dose VIS Date Route   Pfizer COVID-19 Vaccine 01/19/2020  9:28 AM 0.3 mL 11/17/2019 Intramuscular   Manufacturer: [place]   Lot: [id number]   [place]: [id number]

## 2020-02-06 ENCOUNTER — Encounter: Payer: PPO | Admitting: Obstetrics and Gynecology

## 2020-02-13 ENCOUNTER — Ambulatory Visit: Payer: PPO | Attending: Internal Medicine

## 2020-02-13 DIAGNOSIS — Z23 Encounter for immunization: Secondary | ICD-10-CM

## 2020-02-13 NOTE — Progress Notes (Signed)
   Covid-19 Vaccination Clinic  Name:  Melissa [name]    MRN: [id number] DOB: [birth year]  02/13/2020  [name] was observed post Covid-19 immunization for 15 minutes without incident. She was provided with Vaccine Information Sheet and instruction to access the V-Safe system.   [name] was instructed to call 911 with any severe reactions post vaccine: Marland Kitchen Difficulty breathing  . Swelling of face and throat  . A fast heartbeat  . A bad rash all over body  . Dizziness and weakness   Immunizations Administered    Name Date Dose VIS Date Route   Pfizer COVID-19 Vaccine 02/13/2020  9:41 AM 0.3 mL 11/17/2019 Intramuscular   Manufacturer: [place]   Lot: [id number]   [place]: [id number]

## 2020-03-02 ENCOUNTER — Other Ambulatory Visit: Payer: Self-pay | Admitting: Family Medicine

## 2020-03-02 NOTE — Telephone Encounter (Signed)
Requested  medications are  due for refill today yes  Requested medications are on the active medication list yes  Last refill 12/24  Last visit 09/2018  Future visit scheduled no  Notes to clinic failed protocol due to last visit being more than 6 months ago (09/2018)

## 2020-03-03 NOTE — Telephone Encounter (Signed)
LOV  10/01/18  LRF  02/28/19  #180 x 3

## 2020-04-02 ENCOUNTER — Ambulatory Visit
Admission: RE | Admit: 2020-04-02 | Discharge: 2020-04-02 | Disposition: A | Discharge status: Home / Self Care | Payer: PPO | Source: Ambulatory Visit | Attending: Family Medicine | Admitting: Family Medicine

## 2020-04-02 ENCOUNTER — Ambulatory Visit (INDEPENDENT_AMBULATORY_CARE_PROVIDER_SITE_OTHER): Payer: PPO | Admitting: Family Medicine

## 2020-04-02 ENCOUNTER — Encounter: Payer: Self-pay | Admitting: Family Medicine

## 2020-04-02 ENCOUNTER — Other Ambulatory Visit: Payer: Self-pay

## 2020-04-02 ENCOUNTER — Ambulatory Visit
Admission: RE | Admit: 2020-04-02 | Discharge: 2020-04-02 | Disposition: A | Discharge status: Home / Self Care | Payer: PPO | Attending: Family Medicine | Admitting: Family Medicine

## 2020-04-02 VITALS — BP 105/62 | HR 82 | Temp 97.1°F | Wt 174.8 lb

## 2020-04-02 DIAGNOSIS — C20 Malignant neoplasm of rectum: Secondary | ICD-10-CM | POA: Diagnosis not present

## 2020-04-02 DIAGNOSIS — F3342 Major depressive disorder, recurrent, in full remission: Secondary | ICD-10-CM | POA: Diagnosis not present

## 2020-04-02 DIAGNOSIS — E782 Mixed hyperlipidemia: Secondary | ICD-10-CM

## 2020-04-02 DIAGNOSIS — M1990 Unspecified osteoarthritis, unspecified site: Secondary | ICD-10-CM

## 2020-04-02 DIAGNOSIS — M25531 Pain in right wrist: Secondary | ICD-10-CM

## 2020-04-02 DIAGNOSIS — E039 Hypothyroidism, unspecified: Secondary | ICD-10-CM | POA: Diagnosis not present

## 2020-04-02 DIAGNOSIS — M19031 Primary osteoarthritis, right wrist: Secondary | ICD-10-CM | POA: Diagnosis not present

## 2020-04-02 DIAGNOSIS — M6740 Ganglion, unspecified site: Secondary | ICD-10-CM

## 2020-04-02 DIAGNOSIS — M797 Fibromyalgia: Secondary | ICD-10-CM | POA: Diagnosis not present

## 2020-04-02 MED ORDER — PREDNISONE 10 MG PO TABS: ORAL_TABLET | ORAL | 0 refills | Status: DC

## 2020-04-02 NOTE — Progress Notes (Signed)
Established patient visit   Patient: Melissa [name]   DOB: May 25, 1946   73 y.o. Female  MRN: [id number] Visit Date: 04/02/2020  Today's healthcare provider: Wilhemena Durie, MD   Chief Complaint  Patient presents with  . Knot on Wrist  . Hip Pain   Subjective    HPI   Patient presents in office today because she has a painful knot on her wrist that she first noticed about a month ago. Says that it hurt a few days ago when she was out cutting shrubs, she classifies it as a burning pain. Patient is also having some right hip pain and pain in both of her thumbs.  According to the patient she has been taking nabumetone 500 mg twice a day for several years for arthritic type pains.  Emotionally she states she is doing well and has been stable for some time.  Along those lines she is not sleeping nearly as much as she used to. She has no other complaints.  She is not been in in about a year and a half for routine issues.  She has had both Covid vaccines.     Medications: Outpatient Medications Prior to Visit  Medication Sig  . acetaminophen (TYLENOL) 650 MG CR tablet Take 650 mg by mouth daily as needed for pain.  Marland Kitchen Bioflavonoid Products (BIOFLEX PO) Take by mouth daily.  . cholecalciferol (VITAMIN D) 1000 units tablet Take 5,000 Units by mouth daily.   . cholestyramine (QUESTRAN) 4 g packet DISSOLVE 1 PACKET (4 G TOTAL) IN LIQUID AND TAKE BY MOUTH 2 (TWO) TIMES DAILY.  . clobetasol ointment (TEMOVATE) [id number] % Apply 1 application topically 2 (two) times daily. Twice a day for 14 days then daily for 4 weeks  . diphenhydrAMINE (BENADRYL) 25 MG tablet Take 25 mg by mouth every 6 (six) hours as needed for itching.  . levothyroxine (SYNTHROID) 175 MCG tablet TAKE 1 TABLET (175 MCG TOTAL) BY MOUTH DAILY BEFORE BREAKFAST.  Marland Kitchen loperamide (IMODIUM) 2 MG capsule Take 2 mg by mouth 4 (four) times daily as needed for diarrhea or loose stools.  . Multiple Vitamins-Minerals (MULTIVITAL)  tablet Take 1 tablet by mouth daily.  . nabumetone (RELAFEN) 500 MG tablet TAKE 1 TABLET BY MOUTH TWICE A DAY AS NEEDED  . Omega-3 Fatty Acids (FISH OIL) 1200 MG CAPS Take by mouth daily.   . Probiotic Product (DIGESTIVE ADVANTAGE PO) Take by mouth daily.  . sertraline (ZOLOFT) 100 MG tablet TAKE 1 TABLET BY MOUTH TWICE A DAY  . Cyanocobalamin 1000 MCG/ML LIQD Take by mouth daily.  Marland Kitchen estradiol (ESTRACE) 0.1 MG/GM vaginal cream 1/3 applicator every other night (Patient not taking: Reported on 04/02/2020)   No facility-administered medications prior to visit.    Review of Systems  Constitutional: Negative.   HENT: Negative.   Eyes: Negative.   Respiratory: Negative.   Cardiovascular: Negative.   Gastrointestinal: Negative.   Endocrine: Negative.   Musculoskeletal: Positive for arthralgias.  Allergic/Immunologic: Negative.   Neurological: Negative.   Hematological: Negative.   Psychiatric/Behavioral: Negative.         Objective    BP 105/62 (BP Location: Right Arm, Patient Position: Sitting, Cuff Size: Large)   Pulse 82   Temp (!) 97.1 F (36.2 C) (Temporal)   Wt 174 lb 12.8 oz (79.3 kg)   SpO2 97%   BMI 30.96 kg/m  BP Readings from Last 3 Encounters:  04/02/20 105/62  12/06/19 124/65  01/03/19 (!) 146/78  Wt Readings from Last 3 Encounters:  04/02/20 174 lb 12.8 oz (79.3 kg)  12/06/19 175 lb 6.4 oz (79.6 kg)  01/03/19 175 lb (79.4 kg)      Physical Exam Vitals reviewed.  Constitutional:      Appearance: She is well-developed.  HENT:     Head: Normocephalic and atraumatic.  Eyes:     General: No scleral icterus.    Conjunctiva/sclera: Conjunctivae normal.  Neck:     Thyroid: No thyromegaly.  Cardiovascular:     Rate and Rhythm: Normal rate and regular rhythm.     Heart sounds: Normal heart sounds.  Pulmonary:     Effort: Pulmonary effort is normal.     Breath sounds: Normal breath sounds.  Abdominal:     Palpations: Abdomen is soft.  Musculoskeletal:         General: No tenderness or deformity.     Comments: Probable synovial cyst on the radial portion of the palmar right wrist.  Patient is neurovascular intact.  This is nontender.  No warmth or induration  Skin:    General: Skin is warm and dry.  Neurological:     Mental Status: She is alert and oriented to person, place, and time.  Psychiatric:        Mood and Affect: Mood normal.        Behavior: Behavior normal.        Thought Content: Thought content normal.        Judgment: Judgment normal.       No results found for any visits on 04/02/20.  Assessment & Plan    1. Ganglion cyst Pain x-ray of the wrist.  We will treat with prednisone and stop the Relafen for now.  May need referral to hand surgery - CBC with Differential/Platelet - TSH - Comprehensive metabolic panel - DG Wrist Complete Right - predniSONE (DELTASONE) 10 MG tablet; Take 6 tablets day 1, 5 tablets day 2, 4 tablets day 3, 3 tablets day 4, 2 tablets day 5, 1 tablet day 6.  Dispense: 21 tablet; Refill: 0  2. Right wrist pain See #1 - CBC with Differential/Platelet - TSH - Comprehensive metabolic panel - DG Wrist Complete Right - predniSONE (DELTASONE) 10 MG tablet; Take 6 tablets day 1, 5 tablets day 2, 4 tablets day 3, 3 tablets day 4, 2 tablets day 5, 1 tablet day 6.  Dispense: 21 tablet; Refill: 0  3. 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  Her symptoms from radiation therapy almost resolved.  4. Acquired hypothyroidism   5. Osteoarthritis, unspecified osteoarthritis type, unspecified site   6. Mixed hyperlipidemia   7. Fibromyalgia Advised patient to try to take Relafen less often.  8. Recurrent major depressive disorder, in full remission ([place]) Continue sertraline for now.  Will discuss at the time of her physical this summer   No follow-ups on file.      I, Wilhemena Durie, MD, have reviewed all documentation for this visit. The documentation on 04/05/20 for the exam, diagnosis,  procedures, and orders are all accurate and complete.    Rylend Pietrzak Cranford Mon, MD  [facility] [phone] (phone) [phone] (fax)  [place]

## 2020-04-03 LAB — COMPREHENSIVE METABOLIC PANEL
ALT: 17 IU/L (ref 0–32)
AST: 19 IU/L (ref 0–40)
Albumin/Globulin Ratio: 1.9 (ref 1.2–2.2)
Albumin: 4.3 g/dL (ref 3.7–4.7)
Alkaline Phosphatase: 88 IU/L (ref 39–117)
BUN/Creatinine Ratio: 22 (ref 12–28)
BUN: 19 mg/dL (ref 8–27)
Bilirubin Total: <0.2 mg/dL (ref 0.0–1.2)
CO2: 21 mmol/L (ref 20–29)
Calcium: 9.1 mg/dL (ref 8.7–10.3)
Chloride: 105 mmol/L (ref 96–106)
Creatinine, Ser: 0.86 mg/dL (ref 0.57–1.00)
GFR calc Af Amer: 77 mL/min/{1.73_m2} (ref >59)
GFR calc non Af Amer: 67 mL/min/{1.73_m2} (ref >59)
Globulin, Total: 2.3 g/dL (ref 1.5–4.5)
Glucose: 167 mg/dL — ABNORMAL HIGH (ref 65–99)
Potassium: 4.5 mmol/L (ref 3.5–5.2)
Sodium: 140 mmol/L (ref 134–144)
Total Protein: 6.6 g/dL (ref 6.0–8.5)

## 2020-04-03 LAB — CBC WITH DIFFERENTIAL/PLATELET
Basophils Absolute: 0.1 10*3/uL (ref 0.0–0.2)
Basos: 2 %
EOS (ABSOLUTE): 0.1 10*3/uL (ref 0.0–0.4)
Eos: 2 %
Hematocrit: 36.8 % (ref 34.0–46.6)
Hemoglobin: 12.1 g/dL (ref 11.1–15.9)
Immature Grans (Abs): 0.1 10*3/uL (ref 0.0–0.1)
Immature Granulocytes: 1 %
Lymphocytes Absolute: 1.8 10*3/uL (ref 0.7–3.1)
Lymphs: 36 %
MCH: 29.3 pg (ref 26.6–33.0)
MCHC: 32.9 g/dL (ref 31.5–35.7)
MCV: 89 fL (ref 79–97)
Monocytes Absolute: 0.5 10*3/uL (ref 0.1–0.9)
Monocytes: 9 %
Neutrophils Absolute: 2.5 10*3/uL (ref 1.4–7.0)
Neutrophils: 50 %
Platelets: 198 10*3/uL (ref 150–450)
RBC: 4.13 x10E6/uL (ref 3.77–5.28)
RDW: 16.6 % — ABNORMAL HIGH (ref 11.7–15.4)
WBC: 5 10*3/uL (ref 3.4–10.8)

## 2020-04-03 LAB — TSH: TSH: 4.8 u[IU]/mL — ABNORMAL HIGH (ref 0.450–4.500)

## 2020-04-05 ENCOUNTER — Telehealth: Payer: Self-pay

## 2020-04-05 NOTE — Telephone Encounter (Signed)
-----   Message from Jerrol Banana., MD sent at 04/05/2020  8:31 AM EDT ----- Labs stable.  When she comes back for her physical we need to get lipids and an A1c

## 2020-04-05 NOTE — Telephone Encounter (Signed)
Patient advised.

## 2020-05-27 ENCOUNTER — Other Ambulatory Visit: Payer: Self-pay | Admitting: Family Medicine

## 2020-05-29 ENCOUNTER — Ambulatory Visit: Payer: PPO | Admitting: Obstetrics and Gynecology

## 2020-05-29 ENCOUNTER — Encounter: Payer: Self-pay | Admitting: Obstetrics and Gynecology

## 2020-05-29 ENCOUNTER — Other Ambulatory Visit: Payer: Self-pay

## 2020-05-29 VITALS — BP 125/75 | HR 91 | Ht 63.0 in | Wt 177.5 lb

## 2020-05-29 DIAGNOSIS — B373 Candidiasis of vulva and vagina: Secondary | ICD-10-CM | POA: Diagnosis not present

## 2020-05-29 DIAGNOSIS — L900 Lichen sclerosus et atrophicus: Secondary | ICD-10-CM | POA: Diagnosis not present

## 2020-05-29 DIAGNOSIS — N952 Postmenopausal atrophic vaginitis: Secondary | ICD-10-CM

## 2020-05-29 DIAGNOSIS — B3731 Acute candidiasis of vulva and vagina: Secondary | ICD-10-CM

## 2020-05-29 MED ORDER — ESTRADIOL 2 MG VA RING: 2.0000 mg | VAGINAL_RING | VAGINAL | 1 refills | Status: DC

## 2020-05-29 MED ORDER — FLUCONAZOLE 150 MG PO TABS: 150.0000 mg | ORAL_TABLET | Freq: Once | ORAL | 0 refills | Status: AC

## 2020-05-29 NOTE — Progress Notes (Signed)
HPI:      [name] is a 74 y.o. [phone] who LMP was No LMP recorded. Patient has had a hysterectomy.  Subjective:   She presents today complaining of significant vulvar burning and itching last week.  She says it is a little bit better this week but continues to have similar symptoms.  She did increase her clobetasol use when the symptoms began.  She has been using clobetasol twice weekly.  She tried using intravaginal estrogen but found that at night when she got up to use the bathroom most of the estrogen came out and was therefore ineffective.  She noticed no difference.    Hx: The following portions of the patient's history were reviewed and updated as appropriate:             She  has a past medical history of Anal cancer ([place]) (01/12/2003), Depression, Diffuse cystic mastopathy (12/30/12), Dry mouth, Fibroid, Fibromyalgia (2006), Hemorrhoids (2006), Hypothyroidism, Migraines (2006), Other nonspecific finding on examination of urine ([id number]), Personal history of chemotherapy, Personal history of radiation therapy, Sciatica, Tubular adenoma of colon (07/08/2016), and Vulvitis (11/22/2015). She does not have any pertinent problems on file. She  has a past surgical history that includes Colonoscopy (2008, 2013); Tonsillectomy (2005); ANAL MASS EXC (01/12/2003); Breast surgery (2005); Cosmetic surgery (20008); Vaginal hysterectomy (1986); Colonoscopy with propofol (N/A, 07/08/2016); anal ulcer (04/2016); Cataract extraction; Reduction mammaplasty (Bilateral, 1998); and Breast biopsy (Bilateral). Her family history includes Breast cancer in her paternal aunt; Cancer in her father; Diabetes in her father; Heart disease in her maternal grandmother; Stroke in her sister. She  reports that she has never smoked. She has never used smokeless tobacco. She reports that she does not drink alcohol and does not use drugs. She has a current medication list which includes the following  prescription(s): acetaminophen, bioflavonoid products, cholecalciferol, cholestyramine, clobetasol ointment, levothyroxine, loperamide, multivital, nabumetone, fish oil, probiotic product, sertraline, cyanocobalamin, diphenhydramine, estradiol, fluconazole, and prednisone. She is allergic to pentazocine lactate, propoxyphene n-acetaminophen, talwin  [pentazocine], tramadol hcl, ciprofloxacin hcl, darvon  [propoxyphene], dicloxacillin, and etodolac.       Review of Systems:  Review of Systems  Constitutional: Denied constitutional symptoms, night sweats, recent illness, fatigue, fever, insomnia and weight loss.  Eyes: Denied eye symptoms, eye pain, photophobia, vision change and visual disturbance.  Ears/Nose/Throat/Neck: Denied ear, nose, throat or neck symptoms, hearing loss, nasal discharge, sinus congestion and sore throat.  Cardiovascular: Denied cardiovascular symptoms, arrhythmia, chest pain/pressure, edema, exercise intolerance, orthopnea and palpitations.  Respiratory: Denied pulmonary symptoms, asthma, pleuritic pain, productive sputum, cough, dyspnea and wheezing.  Gastrointestinal: Denied, gastro-esophageal reflux, melena, nausea and vomiting.  Genitourinary: See HPI for additional information.  Musculoskeletal: Denied musculoskeletal symptoms, stiffness, swelling, muscle weakness and myalgia.  Dermatologic: Denied dermatology symptoms, rash and scar.  Neurologic: Denied neurology symptoms, dizziness, headache, neck pain and syncope.  Psychiatric: Denied psychiatric symptoms, anxiety and depression.  Endocrine: Denied endocrine symptoms including hot flashes and night sweats.   Meds:   Current Outpatient Medications on File Prior to Visit  Medication Sig Dispense Refill  . acetaminophen (TYLENOL) 650 MG CR tablet Take 650 mg by mouth daily as needed for pain.    Marland Kitchen Bioflavonoid Products (BIOFLEX PO) Take by mouth daily.    . cholecalciferol (VITAMIN D) 1000 units tablet Take 5,000  Units by mouth daily.     . cholestyramine (QUESTRAN) 4 g packet DISSOLVE 1 PACKET (4 G TOTAL) IN LIQUID AND TAKE BY MOUTH 2 (TWO)  TIMES DAILY. 180 packet 3  . clobetasol ointment (TEMOVATE) 9.02 % Apply 1 application topically 2 (two) times daily. Twice a day for 14 days then daily for 4 weeks 30 g 3  . levothyroxine (SYNTHROID) 175 MCG tablet TAKE 1 TABLET (175 MCG TOTAL) BY MOUTH DAILY BEFORE BREAKFAST. 90 tablet 3  . loperamide (IMODIUM) 2 MG capsule Take 2 mg by mouth 4 (four) times daily as needed for diarrhea or loose stools.    . Multiple Vitamins-Minerals (MULTIVITAL) tablet Take 1 tablet by mouth daily.    . nabumetone (RELAFEN) 500 MG tablet TAKE 1 TABLET BY MOUTH TWICE A DAY AS NEEDED 180 tablet 1  . Omega-3 Fatty Acids (FISH OIL) 1200 MG CAPS Take by mouth daily.     . Probiotic Product (DIGESTIVE ADVANTAGE PO) Take by mouth daily.    . sertraline (ZOLOFT) 100 MG tablet TAKE 1 TABLET BY MOUTH TWICE A DAY 180 tablet 0  . Cyanocobalamin 1000 MCG/ML LIQD Take by mouth daily. (Patient not taking: Reported on 05/29/2020)    . diphenhydrAMINE (BENADRYL) 25 MG tablet Take 25 mg by mouth every 6 (six) hours as needed for itching. (Patient not taking: Reported on 05/29/2020)    . predniSONE (DELTASONE) 10 MG tablet Take 6 tablets day 1, 5 tablets day 2, 4 tablets day 3, 3 tablets day 4, 2 tablets day 5, 1 tablet day 6. (Patient not taking: Reported on 05/29/2020) 21 tablet 0   No current facility-administered medications on file prior to visit.    Objective:     Vitals:   05/29/20 1407  BP: 125/75  Pulse: 91              Physical examination   Pelvic:   Vulva: Normal appearance.  No lesions.  Lichen sclerosus especially posterior fourchette  Vagina: No lesions or abnormalities noted.  Significant vaginal atrophy  Support: Normal pelvic support.  Urethra No masses tenderness or scarring.  Meatus Normal size without lesions or prolapse.  Cervix:   Anus: Normal exam.  No lesions.   Perineum: Normal exam.  No lesions.   WET PREP: clue cells: absent, KOH (yeast): positive, odor: absent and trichomoniasis: negative Ph:  < 4.5   Assessment:    [id number] Patient Active Problem List   Diagnosis Date Noted  . Abdominal pain, chronic, right lower quadrant 01/03/2019  . Diarrhea 01/01/2018  . Lichen sclerosus 29/92/4268  . Vulvitis 11/22/2015  . Atrophic vaginitis 04/11/2015  . Chronic diarrhea 04/11/2015  . CD (contact dermatitis) 04/11/2015  . 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 Bloodgood disease 04/11/2015  . Fibrositis 04/11/2015  . Acid reflux 04/11/2015  . Acquired hypothyroidism 04/11/2015  . Hypersomnia 04/11/2015  . Internal hemorrhoids 04/11/2015  . Muscle ache 04/11/2015  . Neuralgia neuritis, sciatic nerve 04/11/2015  . Herpes zoster 04/11/2015  . Apnea, sleep 04/11/2015  . 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 Inflammation of urethra 04/11/2015  . Fibromyalgia   . Diffuse cystic mastopathy   . Migraines   . Other nonspecific finding on examination of urine   . ANKLE PAIN, RIGHT 10/01/2009  . 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 ANKLE SPRAIN, RIGHT 10/01/2009  . Hyperlipidemia, unspecified 11/22/2007  . Osteoarthritis 11/22/2007  . PLANTAR FASCIITIS, BILATERAL 11/22/2007  . 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 Monilial vulvovaginitis   2. Lichen sclerosus   3. Atrophic vaginitis     Lichen sclerosus about the same.  Patient symptomatic because  of monilia vulvovaginitis  Unable to use intravaginal Estrace   Plan:            1.  Continue biweekly clobetasol use  2.  Diflucan for monilia  3.  Estring for atrophic vaginitis Orders No orders of the defined types were placed in this encounter.    Meds ordered this encounter  Medications  . estradiol (ESTRING) 2 MG vaginal ring    Sig: Place 2 mg vaginally every 3 (three) months. follow package directions    Dispense:  1 each    Refill:  1  .  fluconazole (DIFLUCAN) 150 MG tablet    Sig: Take 1 tablet (150 mg total) by mouth once for 1 dose.    Dispense:  1 tablet    Refill:  0      F/U  Return for Annual Physical, Pt to contact us if symptoms worsen. I spent 24 minutes involved in the care of this patient preparing to see the patient by obtaining and reviewing her medical history (including labs, imaging tests and prior procedures), documenting clinical information in the electronic health record (EHR), counseling and coordinating care plans, writing and sending prescriptions, ordering tests or procedures and directly communicating with the patient by discussing pertinent items from her history and physical exam as well as detailing my assessment and plan as noted above so that she has an informed understanding.  All of her questions were answered.  Finis Bud, M.D. 05/29/2020 2:37 PM

## 2020-05-30 ENCOUNTER — Other Ambulatory Visit: Payer: Self-pay | Admitting: Physician Assistant

## 2020-05-30 NOTE — Telephone Encounter (Signed)
I believe this is yours

## 2020-05-30 NOTE — Telephone Encounter (Signed)
You are correct

## 2020-05-30 NOTE — Telephone Encounter (Signed)
Requested  medications are  due for refill today yes  Requested medications are on the active medication list yes  Last refill 3/27  Last visit April 2021  Notes to clinic This med is on current med list, however, MD note from April states to start prednisone and stop Relafen.

## 2020-05-31 ENCOUNTER — Telehealth: Payer: Self-pay | Admitting: Obstetrics and Gynecology

## 2020-05-31 NOTE — Telephone Encounter (Signed)
Pt called in and stated that she was saw this week, she said she had medications sent to the pharmacy. The pt has a question on her Estring. The pt is requesting a call back. Please advise

## 2020-05-31 NOTE — Telephone Encounter (Signed)
LM for patient to return call.

## 2020-06-04 NOTE — Telephone Encounter (Signed)
Spoke with patient and she stated that the vaginal ring is to large and she is unable to insert. Please advise.

## 2020-06-06 NOTE — Telephone Encounter (Signed)
Scheduled patient to come in on 06/11/20 to have ring inserted.

## 2020-06-11 ENCOUNTER — Encounter: Payer: Self-pay | Admitting: Obstetrics and Gynecology

## 2020-06-11 ENCOUNTER — Ambulatory Visit: Payer: PPO | Admitting: Obstetrics and Gynecology

## 2020-06-11 ENCOUNTER — Other Ambulatory Visit: Payer: Self-pay

## 2020-06-11 VITALS — BP 125/80 | HR 89 | Ht 63.0 in | Wt 176.0 lb

## 2020-06-11 DIAGNOSIS — N952 Postmenopausal atrophic vaginitis: Secondary | ICD-10-CM

## 2020-06-11 MED ORDER — ESTRADIOL 10 MCG VA TABS: ORAL_TABLET | VAGINAL | 1 refills | Status: DC

## 2020-06-11 NOTE — Progress Notes (Signed)
HPI:      Melissa [name] is a 74 y.o. [phone] who LMP was No LMP recorded. Patient has had a hysterectomy.  Subjective:   She presents today because she got the Estring and was unable to keep it in the vagina.  It would not stay.    Hx: The following portions of the patient's history were reviewed and updated as appropriate:             She  has a past medical history of Anal cancer ([place]) (01/12/2003), Depression, Diffuse cystic mastopathy (12/30/12), Dry mouth, Fibroid, Fibromyalgia (2006), Hemorrhoids (2006), Hypothyroidism, Migraines (2006), Other nonspecific finding on examination of urine ([id number]), Personal history of chemotherapy, Personal history of radiation therapy, Sciatica, Tubular adenoma of colon (07/08/2016), and Vulvitis (11/22/2015). She does not have any pertinent problems on file. She  has a past surgical history that includes Colonoscopy (2008, 2013); Tonsillectomy (2005); ANAL MASS EXC (01/12/2003); Breast surgery (2005); Cosmetic surgery (20008); Vaginal hysterectomy (1986); Colonoscopy with propofol (N/A, 07/08/2016); anal ulcer (04/2016); Cataract extraction; Reduction mammaplasty (Bilateral, 1998); and Breast biopsy (Bilateral). Her family history includes Breast cancer in her paternal aunt; Cancer in her father; Diabetes in her father; Heart disease in her maternal grandmother; Stroke in her sister. She  reports that she has never smoked. She has never used smokeless tobacco. She reports that she does not drink alcohol and does not use drugs. She has a current medication list which includes the following prescription(s): acetaminophen, bioflavonoid products, cholecalciferol, cholestyramine, clobetasol ointment, cyanocobalamin, diphenhydramine, estradiol, levothyroxine, loperamide, multivital, nabumetone, fish oil, prednisone, probiotic product, and sertraline. She is allergic to pentazocine lactate, propoxyphene n-acetaminophen, talwin  [pentazocine], tramadol  hcl, ciprofloxacin hcl, darvon  [propoxyphene], dicloxacillin, and etodolac.       Review of Systems:  Review of Systems  Constitutional: Denied constitutional symptoms, night sweats, recent illness, fatigue, fever, insomnia and weight loss.  Eyes: Denied eye symptoms, eye pain, photophobia, vision change and visual disturbance.  Ears/Nose/Throat/Neck: Denied ear, nose, throat or neck symptoms, hearing loss, nasal discharge, sinus congestion and sore throat.  Cardiovascular: Denied cardiovascular symptoms, arrhythmia, chest pain/pressure, edema, exercise intolerance, orthopnea and palpitations.  Respiratory: Denied pulmonary symptoms, asthma, pleuritic pain, productive sputum, cough, dyspnea and wheezing.  Gastrointestinal: Denied, gastro-esophageal reflux, melena, nausea and vomiting.  Genitourinary: Denied genitourinary symptoms including symptomatic vaginal discharge, pelvic relaxation issues, and urinary complaints.  Musculoskeletal: Denied musculoskeletal symptoms, stiffness, swelling, muscle weakness and myalgia.  Dermatologic: Denied dermatology symptoms, rash and scar.  Neurologic: Denied neurology symptoms, dizziness, headache, neck pain and syncope.  Psychiatric: Denied psychiatric symptoms, anxiety and depression.  Endocrine: Denied endocrine symptoms including hot flashes and night sweats.   Meds:   Current Outpatient Medications on File Prior to Visit  Medication Sig Dispense Refill  . acetaminophen (TYLENOL) 650 MG CR tablet Take 650 mg by mouth daily as needed for pain.    Marland Kitchen Bioflavonoid Products (BIOFLEX PO) Take by mouth daily.    . cholecalciferol (VITAMIN D) 1000 units tablet Take 5,000 Units by mouth daily.     . cholestyramine (QUESTRAN) 4 g packet DISSOLVE 1 PACKET (4 G TOTAL) IN LIQUID AND TAKE BY MOUTH 2 (TWO) TIMES DAILY. 180 packet 3  . clobetasol ointment (TEMOVATE) 7.67 % Apply 1 application topically 2 (two) times daily. Twice a day for 14 days then daily for 4  weeks 30 g 3  . Cyanocobalamin 1000 MCG/ML LIQD Take by mouth daily. (Patient not taking: Reported on 05/29/2020)    .  diphenhydrAMINE (BENADRYL) 25 MG tablet Take 25 mg by mouth every 6 (six) hours as needed for itching. (Patient not taking: Reported on 05/29/2020)    . levothyroxine (SYNTHROID) 175 MCG tablet TAKE 1 TABLET (175 MCG TOTAL) BY MOUTH DAILY BEFORE BREAKFAST. 90 tablet 3  . loperamide (IMODIUM) 2 MG capsule Take 2 mg by mouth 4 (four) times daily as needed for diarrhea or loose stools.    . Multiple Vitamins-Minerals (MULTIVITAL) tablet Take 1 tablet by mouth daily.    . nabumetone (RELAFEN) 500 MG tablet TAKE 1 TABLET BY MOUTH TWICE A DAY AS NEEDED 180 tablet 1  . Omega-3 Fatty Acids (FISH OIL) 1200 MG CAPS Take by mouth daily.     . predniSONE (DELTASONE) 10 MG tablet Take 6 tablets day 1, 5 tablets day 2, 4 tablets day 3, 3 tablets day 4, 2 tablets day 5, 1 tablet day 6. (Patient not taking: Reported on 05/29/2020) 21 tablet 0  . Probiotic Product (DIGESTIVE ADVANTAGE PO) Take by mouth daily.    . sertraline (ZOLOFT) 100 MG tablet TAKE 1 TABLET BY MOUTH TWICE A DAY 180 tablet 0   No current facility-administered medications on file prior to visit.    Objective:     Vitals:   06/11/20 1049  BP: 125/80  Pulse: 89              Speculum exam reveals atrophic vaginitis with a small caliber vagina and short length.   Attempt to place Estring correctly failed.  Estring does not have enough room to expand once inside the vagina.  Assessment:    [id number] Patient Active Problem List   Diagnosis Date Noted  . Abdominal pain, chronic, right lower quadrant 01/03/2019  . Diarrhea 01/01/2018  . Lichen sclerosus 78/46/9629  . Vulvitis 11/22/2015  . Atrophic vaginitis 04/11/2015  . Chronic diarrhea 04/11/2015  . CD (contact dermatitis) 04/11/2015  . 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 Bloodgood disease 04/11/2015  . Fibrositis 04/11/2015  . Acid reflux 04/11/2015  .  Acquired hypothyroidism 04/11/2015  . Hypersomnia 04/11/2015  . Internal hemorrhoids 04/11/2015  . Muscle ache 04/11/2015  . Neuralgia neuritis, sciatic nerve 04/11/2015  . Herpes zoster 04/11/2015  . Apnea, sleep 04/11/2015  . 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 Inflammation of urethra 04/11/2015  . Fibromyalgia   . Diffuse cystic mastopathy   . Migraines   . Other nonspecific finding on examination of urine   . ANKLE PAIN, RIGHT 10/01/2009  . 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 ANKLE SPRAIN, RIGHT 10/01/2009  . Hyperlipidemia, unspecified 11/22/2007  . Osteoarthritis 11/22/2007  . PLANTAR FASCIITIS, BILATERAL 11/22/2007  . 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 Atrophic vaginitis     Patient unhappy with use of estrogen vaginal cream because it does not stay in the vagina.  Has failed Estring because of small caliber short length vagina.   Plan:            1.  We will try Vagifem for 3 months.  If vaginal caliber increases consider use of Estring again.  If Vagifem going well we could continue this. Orders No orders of the defined types were placed in this encounter.    Meds ordered this encounter  Medications  . Estradiol 10 MCG TABS vaginal tablet    Sig: 1 tab per vagina daily for 2 weeks then twice weekly    Dispense:  35 tablet    Refill:  1  F/U  Return in about 3 months (around 09/11/2020). I spent 21 minutes involved in the care of this patient preparing to see the patient by obtaining and reviewing her medical history (including labs, imaging tests and prior procedures), documenting clinical information in the electronic health record (EHR), counseling and coordinating care plans, writing and sending prescriptions, ordering tests or procedures and directly communicating with the patient by discussing pertinent items from her history and physical exam as well as detailing my assessment and plan as noted above so that she has an  informed understanding.  All of her questions were answered.  Finis Bud, M.D. 06/11/2020 11:25 AM

## 2020-06-12 ENCOUNTER — Other Ambulatory Visit: Payer: Self-pay | Admitting: Family Medicine

## 2020-06-12 NOTE — Telephone Encounter (Signed)
Per chart note on 04/05/20 patient will have labs at next OV 09/12/20. Last lipid results 03/28/18. Approved 90 day refill to get patient to future visit.

## 2020-07-02 ENCOUNTER — Other Ambulatory Visit: Payer: Self-pay

## 2020-07-15 ENCOUNTER — Telehealth: Payer: Self-pay

## 2020-07-15 DIAGNOSIS — M25551 Pain in right hip: Secondary | ICD-10-CM

## 2020-07-15 NOTE — Telephone Encounter (Signed)
Copied from [id number] [phone]. Topic: General - Inquiry >> Jul 15, 2020 11:59 AM Mathis Bud wrote: Reason for CRM: Patient is requesting a ortho referral to be made.  Patient states that PCP had spoken to her regarding and is wondering what the status is. Call back [phone]

## 2020-07-16 NOTE — Telephone Encounter (Signed)
yes

## 2020-07-16 NOTE — Telephone Encounter (Signed)
If it is for the wrist pain for which she got the x-ray this spring I would refer her to a Copy,

## 2020-07-16 NOTE — Telephone Encounter (Signed)
Patient advised that referral has been placed to ortho and will be contacted to set up an appointment.

## 2020-07-16 NOTE — Telephone Encounter (Signed)
Spoke to patient and she said the pain is in both of her hips and she wanted to know if she could be referred to ortho for her hip pain. Please advise?

## 2020-07-22 DIAGNOSIS — M1711 Unilateral primary osteoarthritis, right knee: Secondary | ICD-10-CM | POA: Diagnosis not present

## 2020-07-22 DIAGNOSIS — M7061 Trochanteric bursitis, right hip: Secondary | ICD-10-CM | POA: Diagnosis not present

## 2020-07-22 DIAGNOSIS — M545 Low back pain, unspecified: Secondary | ICD-10-CM | POA: Insufficient documentation

## 2020-07-22 DIAGNOSIS — M7062 Trochanteric bursitis, left hip: Secondary | ICD-10-CM | POA: Diagnosis not present

## 2020-08-14 ENCOUNTER — Other Ambulatory Visit: Payer: Self-pay | Admitting: Family Medicine

## 2020-08-29 ENCOUNTER — Telehealth: Payer: Self-pay | Admitting: Family Medicine

## 2020-08-29 NOTE — Telephone Encounter (Signed)
Copied from [place] #[id number]. Topic: Medicare AWV >> Aug 29, 2020  4:31 PM Cher Nakai R wrote: Reason for CRM:  Left message for patient to call back and schedule Medicare Annual Wellness Visit (AWV) either virtually or in office.  Last AWV 08/29/2019  Please schedule at anytime with [facility] Health Advisor.  If any questions, please contact me at [phone]

## 2020-09-02 ENCOUNTER — Other Ambulatory Visit: Payer: Self-pay | Admitting: Family Medicine

## 2020-09-02 DIAGNOSIS — M7060 Trochanteric bursitis, unspecified hip: Secondary | ICD-10-CM | POA: Diagnosis not present

## 2020-09-02 DIAGNOSIS — M1711 Unilateral primary osteoarthritis, right knee: Secondary | ICD-10-CM | POA: Diagnosis not present

## 2020-09-02 DIAGNOSIS — M545 Low back pain: Secondary | ICD-10-CM | POA: Diagnosis not present

## 2020-09-03 DIAGNOSIS — M1711 Unilateral primary osteoarthritis, right knee: Secondary | ICD-10-CM | POA: Diagnosis not present

## 2020-09-03 DIAGNOSIS — M545 Low back pain: Secondary | ICD-10-CM | POA: Diagnosis not present

## 2020-09-05 DIAGNOSIS — H524 Presbyopia: Secondary | ICD-10-CM | POA: Diagnosis not present

## 2020-09-05 DIAGNOSIS — H57811 Brow ptosis, right: Secondary | ICD-10-CM | POA: Diagnosis not present

## 2020-09-05 DIAGNOSIS — Z9841 Cataract extraction status, right eye: Secondary | ICD-10-CM | POA: Diagnosis not present

## 2020-09-05 DIAGNOSIS — Z9842 Cataract extraction status, left eye: Secondary | ICD-10-CM | POA: Diagnosis not present

## 2020-09-06 DIAGNOSIS — M5459 Other low back pain: Secondary | ICD-10-CM | POA: Diagnosis not present

## 2020-09-06 DIAGNOSIS — M1711 Unilateral primary osteoarthritis, right knee: Secondary | ICD-10-CM | POA: Diagnosis not present

## 2020-09-11 ENCOUNTER — Encounter: Payer: Self-pay | Admitting: Obstetrics and Gynecology

## 2020-09-11 ENCOUNTER — Ambulatory Visit: Payer: PPO | Admitting: Obstetrics and Gynecology

## 2020-09-11 ENCOUNTER — Other Ambulatory Visit: Payer: Self-pay

## 2020-09-11 VITALS — BP 120/73 | HR 89 | Ht 63.0 in | Wt 178.8 lb

## 2020-09-11 DIAGNOSIS — R1032 Left lower quadrant pain: Secondary | ICD-10-CM

## 2020-09-11 DIAGNOSIS — L900 Lichen sclerosus et atrophicus: Secondary | ICD-10-CM | POA: Diagnosis not present

## 2020-09-11 DIAGNOSIS — B009 Herpesviral infection, unspecified: Secondary | ICD-10-CM

## 2020-09-11 DIAGNOSIS — Z86004 Personal history of in-situ neoplasm of other and unspecified digestive organs: Secondary | ICD-10-CM | POA: Diagnosis not present

## 2020-09-11 DIAGNOSIS — K626 Ulcer of anus and rectum: Secondary | ICD-10-CM | POA: Diagnosis not present

## 2020-09-11 DIAGNOSIS — N952 Postmenopausal atrophic vaginitis: Secondary | ICD-10-CM

## 2020-09-11 NOTE — Progress Notes (Signed)
HPI:      [name] is a 74 y.o. [phone] who LMP was No LMP recorded. Patient has had a hysterectomy.  Subjective:   She presents today for follow-up of intravaginal estrogen use.  She reports that her symptoms are much improved and she feels much better using the Vagifem tablets.  She continues to use clobetasol twice a week on her vulvar lichen sclerosus. During her examination she had several bouts of acute abdominal pain which was quite limiting for her. She reports that she has rectal bleeding with bowel movements.  Of significant note patient has a history of anal ulceration/carcinoma. She also complains of a spot on her buttocks that was burning.  She would like this looked at today.    Hx: The following portions of the patient's history were reviewed and updated as appropriate:             She  has a past medical history of Anal cancer ([place]) (01/12/2003), Depression, Diffuse cystic mastopathy (12/30/12), Dry mouth, Fibroid, Fibromyalgia (2006), Hemorrhoids (2006), Hypothyroidism, Migraines (2006), Other nonspecific finding on examination of urine ([id number]), Personal history of chemotherapy, Personal history of radiation therapy, Sciatica, Tubular adenoma of colon (07/08/2016), and Vulvitis (11/22/2015). She does not have any pertinent problems on file. She  has a past surgical history that includes Colonoscopy (2008, 2013); Tonsillectomy (2005); ANAL MASS EXC (01/12/2003); Breast surgery (2005); Cosmetic surgery (20008); Vaginal hysterectomy (1986); Colonoscopy with propofol (N/A, 07/08/2016); anal ulcer (04/2016); Cataract extraction; Reduction mammaplasty (Bilateral, 1998); and Breast biopsy (Bilateral). Her family history includes Breast cancer in her paternal aunt; Cancer in her father; Diabetes in her father; Heart disease in her maternal grandmother; Stroke in her sister. She  reports that she has never smoked. She has never used smokeless tobacco. She reports that she  does not drink alcohol and does not use drugs. She has a current medication list which includes the following prescription(s): acetaminophen, bioflavonoid products, cholecalciferol, cholestyramine, clobetasol ointment, cyanocobalamin, diphenhydramine, estradiol, levothyroxine, loperamide, multivital, nabumetone, fish oil, probiotic product, and sertraline. She is allergic to pentazocine lactate, propoxyphene n-acetaminophen, talwin  [pentazocine], tramadol hcl, ciprofloxacin hcl, darvon  [propoxyphene], dicloxacillin, and etodolac.       Review of Systems:  Review of Systems  Constitutional: Denied constitutional symptoms, night sweats, recent illness, fatigue, fever, insomnia and weight loss.  Eyes: Denied eye symptoms, eye pain, photophobia, vision change and visual disturbance.  Ears/Nose/Throat/Neck: Denied ear, nose, throat or neck symptoms, hearing loss, nasal discharge, sinus congestion and sore throat.  Cardiovascular: Denied cardiovascular symptoms, arrhythmia, chest pain/pressure, edema, exercise intolerance, orthopnea and palpitations.  Respiratory: Denied pulmonary symptoms, asthma, pleuritic pain, productive sputum, cough, dyspnea and wheezing.  Gastrointestinal: Denied, gastro-esophageal reflux, melena, nausea and vomiting.  Genitourinary: See HPI for additional information.  Musculoskeletal: Denied musculoskeletal symptoms, stiffness, swelling, muscle weakness and myalgia.  Dermatologic: Denied dermatology symptoms, rash and scar.  Neurologic: Denied neurology symptoms, dizziness, headache, neck pain and syncope.  Psychiatric: Denied psychiatric symptoms, anxiety and depression.  Endocrine: Denied endocrine symptoms including hot flashes and night sweats.   Meds:   Current Outpatient Medications on File Prior to Visit  Medication Sig Dispense Refill  . acetaminophen (TYLENOL) 650 MG CR tablet Take 650 mg by mouth daily as needed for pain.    Marland Kitchen Bioflavonoid Products (BIOFLEX  PO) Take by mouth daily.    . cholecalciferol (VITAMIN D) 1000 units tablet Take 5,000 Units by mouth daily.     . cholestyramine (QUESTRAN) 4 g  packet DISSOLVE 1 PACKET (4 G TOTAL) IN LIQUID AND TAKE BY MOUTH 2 (TWO) TIMES DAILY. 180 packet 0  . clobetasol ointment (TEMOVATE) 1.61 % Apply 1 application topically 2 (two) times daily. Twice a day for 14 days then daily for 4 weeks 30 g 3  . Cyanocobalamin 1000 MCG/ML LIQD Take by mouth daily.     . diphenhydrAMINE (BENADRYL) 25 MG tablet Take 25 mg by mouth every 6 (six) hours as needed for itching.     . Estradiol 10 MCG TABS vaginal tablet 1 tab per vagina daily for 2 weeks then twice weekly 35 tablet 1  . levothyroxine (SYNTHROID) 175 MCG tablet TAKE 1 TABLET BY MOUTH DAILY BEFORE BREAKFAST 90 tablet 3  . loperamide (IMODIUM) 2 MG capsule Take 2 mg by mouth 4 (four) times daily as needed for diarrhea or loose stools.    . Multiple Vitamins-Minerals (MULTIVITAL) tablet Take 1 tablet by mouth daily.    . nabumetone (RELAFEN) 500 MG tablet TAKE 1 TABLET BY MOUTH TWICE A DAY AS NEEDED 180 tablet 1  . Omega-3 Fatty Acids (FISH OIL) 1200 MG CAPS Take by mouth daily.     . Probiotic Product (DIGESTIVE ADVANTAGE PO) Take by mouth daily.    . sertraline (ZOLOFT) 100 MG tablet TAKE 1 TABLET BY MOUTH TWICE A DAY 180 tablet 0   No current facility-administered medications on file prior to visit.     Objective:     Vitals:   09/11/20 0937  BP: 120/73  Pulse: 89   Filed Weights   09/11/20 0937  Weight: 178 lb 12.8 oz (81.1 kg)              Vulvar examination reveals presence of lichen sclerosus which appears to be stable at this time.  Vaginal examination shows significant vaginal atrophy but slightly improved.  Examination of her anal verge reveals ulceration/fissure type lesion at least 2 cm in length.  Examination of left buttocks reveals what appears to be several small punctate healed areas that  could have been consistent with HSV  lesions.  Assessment:    [id number] Patient Active Problem List   Diagnosis Date Noted  . Abdominal pain, chronic, right lower quadrant 01/03/2019  . Diarrhea 01/01/2018  . Lichen sclerosus 09/81/1914  . Vulvitis 11/22/2015  . Atrophic vaginitis 04/11/2015  . Chronic diarrhea 04/11/2015  . CD (contact dermatitis) 04/11/2015  . 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 Bloodgood disease 04/11/2015  . Fibrositis 04/11/2015  . Acid reflux 04/11/2015  . Acquired hypothyroidism 04/11/2015  . Hypersomnia 04/11/2015  . Internal hemorrhoids 04/11/2015  . Muscle ache 04/11/2015  . Neuralgia neuritis, sciatic nerve 04/11/2015  . Herpes zoster 04/11/2015  . Apnea, sleep 04/11/2015  . 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 Inflammation of urethra 04/11/2015  . Fibromyalgia   . Diffuse cystic mastopathy   . Migraines   . Other nonspecific finding on examination of urine   . ANKLE PAIN, RIGHT 10/01/2009  . 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 ANKLE SPRAIN, RIGHT 10/01/2009  . Hyperlipidemia, unspecified 11/22/2007  . Osteoarthritis 11/22/2007  . PLANTAR FASCIITIS, BILATERAL 11/22/2007  . 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 Atrophic vaginitis   2. Lichen sclerosus   3. History of carcinoma in situ of anal canal   4. Rectal ulceration   5. Left lower quadrant abdominal pain   6. HSV infection     Atrophic vaginitis slightly improved-symptomatically improved  Persistent  lichen sclerosus currently being managed with clobetasol  Rectal ulceration/lesion present at the site of previous carcinoma in situ -appears somewhat concerning.  Healed lesions on left buttocks possibly consistent with HSV.  Symptomatology consistent with HSV as well.  Limiting abdominal pain of unknown origin but seems significant by patient's reaction to it.   Plan:            1.  Recommend work-up of abdominal pain and anal lesion.  Recommend [name], or GI, for colorectal surgery.  I  have discussed this in detail with the patient and she will follow up with this.  2.  Continue use of clobetasol and Vagifem  3.  Recommend patient present if buttock lesions recur so we can perform cultures.  Secondarily could check serology if necessary.  Orders No orders of the defined types were placed in this encounter.   No orders of the defined types were placed in this encounter.     F/U  Return in about 3 months (around 12/12/2020). I spent 33 minutes involved in the care of this patient preparing to see the patient by obtaining and reviewing her medical history (including labs, imaging tests and prior procedures), documenting clinical information in the electronic health record (EHR), counseling and coordinating care plans, writing and sending prescriptions, ordering tests or procedures and directly communicating with the patient by discussing pertinent items from her history and physical exam as well as detailing my assessment and plan as noted above so that she has an informed understanding.  All of her questions were answered.  Finis Bud, M.D. 09/11/2020 10:03 AM

## 2020-09-12 ENCOUNTER — Encounter: Payer: Self-pay | Admitting: Family Medicine

## 2020-09-17 DIAGNOSIS — M1711 Unilateral primary osteoarthritis, right knee: Secondary | ICD-10-CM | POA: Diagnosis not present

## 2020-09-17 DIAGNOSIS — M5459 Other low back pain: Secondary | ICD-10-CM | POA: Diagnosis not present

## 2020-09-25 DIAGNOSIS — H57811 Brow ptosis, right: Secondary | ICD-10-CM | POA: Diagnosis not present

## 2020-09-25 NOTE — Progress Notes (Signed)
I,April Miller,acting as a scribe for Wilhemena Durie, MD.,have documented all relevant documentation on the behalf of Wilhemena Durie, MD,as directed by  Wilhemena Durie, MD while in the presence of Wilhemena Durie, MD.   Annual Wellness Visit     Patient: Melissa [name], Female    DOB: 05/04/1946, 74 y.o.   MRN: [id number] Visit Date: 09/26/2020  Today's Provider: Wilhemena Durie, MD   Chief Complaint  Patient presents with  . Medicare Wellness   Subjective    Melissa [name] is a 74 y.o. female who presents today for her Annual Wellness Visit. She reports consuming a general diet. Home exercise routine includes PT for back. She generally feels fairly well. She reports sleeping well. She does not have additional problems to discuss today.   HPI She has had anal irritation in recent weeks.  She had anal cancer treated several years ago by [name]  and radiation oncology      Medications: Outpatient Medications Prior to Visit  Medication Sig  . acetaminophen (TYLENOL) 650 MG CR tablet Take 650 mg by mouth daily as needed for pain.  Marland Kitchen Bioflavonoid Products (BIOFLEX PO) Take by mouth daily.  . cholecalciferol (VITAMIN D) 1000 units tablet Take 5,000 Units by mouth daily.   . cholestyramine (QUESTRAN) 4 g packet DISSOLVE 1 PACKET (4 G TOTAL) IN LIQUID AND TAKE BY MOUTH 2 (TWO) TIMES DAILY.  . clobetasol ointment (TEMOVATE) 2.87 % Apply 1 application topically 2 (two) times daily. Twice a day for 14 days then daily for 4 weeks  . diphenhydrAMINE (BENADRYL) 25 MG tablet Take 25 mg by mouth every 6 (six) hours as needed for itching.   . Estradiol 10 MCG TABS vaginal tablet 1 tab per vagina daily for 2 weeks then twice weekly  . levothyroxine (SYNTHROID) 175 MCG tablet TAKE 1 TABLET BY MOUTH DAILY BEFORE BREAKFAST  . loperamide (IMODIUM) 2 MG capsule Take 2 mg by mouth 4 (four) times daily as needed for diarrhea or loose stools.  . Multiple  Vitamins-Minerals (MULTIVITAL) tablet Take 1 tablet by mouth daily.  . nabumetone (RELAFEN) 500 MG tablet TAKE 1 TABLET BY MOUTH TWICE A DAY AS NEEDED  . Omega-3 Fatty Acids (FISH OIL) 1200 MG CAPS Take by mouth daily.   . Probiotic Product (DIGESTIVE ADVANTAGE PO) Take by mouth daily.  . sertraline (ZOLOFT) 100 MG tablet TAKE 1 TABLET BY MOUTH TWICE A DAY  . Cyanocobalamin 1000 MCG/ML LIQD Take by mouth daily.  (Patient not taking: Reported on 09/26/2020)   No facility-administered medications prior to visit.    Allergies  Allergen Reactions  . Pentazocine Lactate   . Propoxyphene N-Acetaminophen   . Talwin  [Pentazocine]   . Tramadol Hcl   . Ciprofloxacin Hcl Rash  . Darvon  [Propoxyphene] Rash    Hallucinations Hallucinations.  . Dicloxacillin Rash  . Etodolac Rash    Patient Care Team: Jerrol Banana., MD as PCP - General (Unknown Physician Specialty) Thelma Comp, [place] as Consulting Physician (Optometry) Vevelyn Royals, MD as Consulting Physician (Ophthalmology)  Review of Systems  Gastrointestinal: Positive for abdominal pain, anal bleeding, diarrhea and rectal pain.  Genitourinary: Positive for enuresis.  Musculoskeletal: Positive for arthralgias and joint swelling.  Hematological: Bruises/bleeds easily.  All other systems reviewed and are negative.      Objective    Vitals: BP (!) 112/51 (BP Location: Left Arm, Patient Position: Sitting, Cuff Size: Large)   Pulse  69   Temp 98.6 F (37 C) (Oral)   Resp 16   Ht 5\' 3"  (1.6 m)   Wt 183 lb (83 kg)   SpO2 98%   BMI 32.42 kg/m    Physical Exam Vitals reviewed.  Constitutional:      Appearance: She is well-developed.  HENT:     Head: Normocephalic and atraumatic.     Right Ear: External ear normal.     Left Ear: External ear normal.     Nose: Nose normal.  Eyes:     Conjunctiva/sclera: Conjunctivae normal.     Pupils: Pupils are equal, round, and reactive to light.  Cardiovascular:     Rate  and Rhythm: Normal rate and regular rhythm.     Heart sounds: Normal heart sounds.  Pulmonary:     Effort: Pulmonary effort is normal.     Breath sounds: Normal breath sounds.  Abdominal:     General: Bowel sounds are normal.     Palpations: Abdomen is soft.  Genitourinary:    Comments: Pt sees gynecology for well woman exam.  Declines anal exam Musculoskeletal:     Cervical back: Normal range of motion and neck supple.  Skin:    General: Skin is warm and dry.  Neurological:     General: No focal deficit present.     Mental Status: She is alert and oriented to person, place, and time.  Psychiatric:        Mood and Affect: Mood normal.        Behavior: Behavior normal.        Thought Content: Thought content normal.        Judgment: Judgment normal.        Most recent functional status assessment: In your present state of health, do you have any difficulty performing the following activities: 09/26/2020  Hearing? N  Vision? N  Difficulty concentrating or making decisions? N  Walking or climbing stairs? N  Dressing or bathing? N  Doing errands, shopping? N  Some recent data might be hidden   Most recent fall risk assessment: Fall Risk  09/26/2020  Falls in the past year? 0  Comment -  Number falls in past yr: 0  Injury with Fall? 0  Risk for fall due to : -  Follow up Falls evaluation completed    Most recent depression screenings: PHQ 2/9 Scores 09/26/2020 08/29/2019  PHQ - 2 Score 0 0  PHQ- 9 Score 2 -   Most recent cognitive screening: 6CIT Screen 09/26/2020  What Year? 0 points  What month? 0 points  What time? 0 points  Count back from 20 0 points  Months in reverse 0 points  Repeat phrase 0 points  Total Score 0   Most recent Audit-C alcohol use screening Alcohol Use Disorder Test (AUDIT) 09/26/2020  1. How often do you have a drink containing alcohol? 0  2. How many drinks containing alcohol do you have on a typical day when you are drinking? 0    3. How often do you have six or more drinks on one occasion? 0  AUDIT-C Score 0  Alcohol Brief Interventions/Follow-up AUDIT Score <7 follow-up not indicated   A score of 3 or more in women, and 4 or more in men indicates increased risk for alcohol abuse, EXCEPT if all of the points are from question 1   No results found for any visits on 09/26/20.  Assessment & Plan     Annual wellness visit  done today including the all of the following: Reviewed patient's Family Medical History Reviewed and updated list of patient's medical providers Assessment of cognitive impairment was done Assessed patient's functional ability Established a written schedule for health screening [place] Completed and Reviewed  Exercise Activities and Dietary recommendations Goals    . DIET - REDUCE SODIUM INTAKE     Recommend to decrease amount of salt in diet and avoid high sodium foods.     . Increase water intake     Starting 01/05/17, I will continue to drink 6-8 glasses of water a day.       Immunization History  Administered Date(s) Administered  . Fluad Quad(high Dose 65+) 08/23/2019  . Influenza Split 09/16/2011, 09/20/2012  . Influenza, High Dose Seasonal PF 08/30/2014, 09/04/2015, 09/02/2016, 09/02/2017  . Influenza,inj,Quad PF,6+ Mos 09/14/2013  . Influenza-Unspecified 08/07/2018  . PFIZER SARS-COV-2 Vaccination 01/19/2020, 02/13/2020  . Pneumococcal Conjugate-13 10/25/2014  . Pneumococcal Polysaccharide-23 04/09/2006, 12/10/2011  . Tdap 09/16/2011    Health Maintenance  Topic Date Due  . INFLUENZA VACCINE  07/07/2020  . COLONOSCOPY  07/08/2021  . TETANUS/TDAP  09/15/2021  . DEXA SCAN  04/29/2023  . COVID-19 Vaccine  Completed  . Hepatitis C Screening  Completed  . PNA vac Low Risk Adult  Completed     Discussed health benefits of physical activity, and encouraged her to engage in regular exercise appropriate for her age and condition.    1. Encounter for  Medicare annual wellness exam   2. Annual physical exam   3. Mixed hyperlipidemia  - Lipid panel  4. Elevated glucose level  - Hemoglobin A1c  5. Acquired hypothyroidism  - TSH  6. CA of rectum/Anal ([place])  - Ambulatory referral to General Surgery  7. Dermatitis  - Ambulatory referral to Dermatology  8. Rectal pain Patient wishes to follow-up with surgery for this. - Ambulatory referral to General Surgery  9. Need for influenza vaccination  - Flu Vaccine QUAD High Dose(Fluad)    Return in about 6 months (around 03/27/2021).        Delphine Sizemore Cranford Mon, MD  [facility] [phone] (phone) [phone] (fax)  [place]

## 2020-09-26 ENCOUNTER — Other Ambulatory Visit: Payer: Self-pay

## 2020-09-26 ENCOUNTER — Encounter: Payer: Self-pay | Admitting: Family Medicine

## 2020-09-26 ENCOUNTER — Ambulatory Visit (INDEPENDENT_AMBULATORY_CARE_PROVIDER_SITE_OTHER): Payer: PPO | Admitting: Family Medicine

## 2020-09-26 VITALS — BP 112/51 | HR 69 | Temp 98.6°F | Resp 16 | Ht 63.0 in | Wt 183.0 lb

## 2020-09-26 DIAGNOSIS — Z0000 Encounter for general adult medical examination without abnormal findings: Secondary | ICD-10-CM | POA: Diagnosis not present

## 2020-09-26 DIAGNOSIS — L309 Dermatitis, unspecified: Secondary | ICD-10-CM

## 2020-09-26 DIAGNOSIS — K6289 Other specified diseases of anus and rectum: Secondary | ICD-10-CM | POA: Diagnosis not present

## 2020-09-26 DIAGNOSIS — E039 Hypothyroidism, unspecified: Secondary | ICD-10-CM | POA: Diagnosis not present

## 2020-09-26 DIAGNOSIS — R7309 Other abnormal glucose: Secondary | ICD-10-CM

## 2020-09-26 DIAGNOSIS — C20 Malignant neoplasm of rectum: Secondary | ICD-10-CM | POA: Diagnosis not present

## 2020-09-26 DIAGNOSIS — Z23 Encounter for immunization: Secondary | ICD-10-CM | POA: Diagnosis not present

## 2020-09-26 DIAGNOSIS — E782 Mixed hyperlipidemia: Secondary | ICD-10-CM

## 2020-09-27 DIAGNOSIS — R7309 Other abnormal glucose: Secondary | ICD-10-CM | POA: Diagnosis not present

## 2020-09-27 DIAGNOSIS — E782 Mixed hyperlipidemia: Secondary | ICD-10-CM | POA: Diagnosis not present

## 2020-09-27 DIAGNOSIS — E039 Hypothyroidism, unspecified: Secondary | ICD-10-CM | POA: Diagnosis not present

## 2020-09-28 LAB — HEMOGLOBIN A1C
Est. average glucose Bld gHb Est-mCnc: 120 mg/dL
Hgb A1c MFr Bld: 5.8 % — ABNORMAL HIGH (ref 4.8–5.6)

## 2020-09-28 LAB — LIPID PANEL
Chol/HDL Ratio: 3.7 ratio (ref 0.0–4.4)
Cholesterol, Total: 232 mg/dL — ABNORMAL HIGH (ref 100–199)
HDL: 63 mg/dL (ref >39)
LDL Chol Calc (NIH): 131 mg/dL — ABNORMAL HIGH (ref 0–99)
Triglycerides: 217 mg/dL — ABNORMAL HIGH (ref 0–149)
VLDL Cholesterol Cal: 38 mg/dL (ref 5–40)

## 2020-09-28 LAB — TSH: TSH: 2.23 u[IU]/mL (ref 0.450–4.500)

## 2020-09-30 ENCOUNTER — Ambulatory Visit: Payer: PPO | Admitting: Dermatology

## 2020-09-30 ENCOUNTER — Other Ambulatory Visit: Payer: Self-pay

## 2020-09-30 DIAGNOSIS — L578 Other skin changes due to chronic exposure to nonionizing radiation: Secondary | ICD-10-CM

## 2020-09-30 DIAGNOSIS — D485 Neoplasm of uncertain behavior of skin: Secondary | ICD-10-CM | POA: Diagnosis not present

## 2020-09-30 DIAGNOSIS — C44519 Basal cell carcinoma of skin of other part of trunk: Secondary | ICD-10-CM | POA: Diagnosis not present

## 2020-09-30 DIAGNOSIS — C4491 Basal cell carcinoma of skin, unspecified: Secondary | ICD-10-CM

## 2020-09-30 DIAGNOSIS — L814 Other melanin hyperpigmentation: Secondary | ICD-10-CM | POA: Diagnosis not present

## 2020-09-30 HISTORY — DX: Basal cell carcinoma of skin, unspecified: C44.91

## 2020-09-30 NOTE — Patient Instructions (Signed)

## 2020-09-30 NOTE — Progress Notes (Signed)
   New Patient Visit  Subjective  Melissa [name] is a 74 y.o. female who presents for the following: Pink patch (back - has been there for years and itches. Patient's PCP wanted it checked).  The following portions of the chart were reviewed this encounter and updated as appropriate:  Tobacco  Allergies  Meds  Problems  Med Hx  Surg Hx  Fam Hx     Review of Systems:  No other skin or systemic complaints except as noted in HPI or Assessment and Plan.  Objective  Well appearing patient in no apparent distress; mood and affect are within normal limits.  A focused examination was performed including the trunk. Relevant physical exam findings are noted in the Assessment and Plan.  Objective  Upper back: 1.8 x 1.1 cm pink patch       Assessment & Plan  Neoplasm of uncertain behavior of skin -suspicious for basal cell carcinoma Upper back  Skin / nail biopsy Type of biopsy: tangential   Informed consent: discussed and consent obtained   Timeout: patient name, date of birth, surgical site, and procedure verified   Procedure prep:  Patient was prepped and draped in usual sterile fashion Prep type:  Isopropyl alcohol Anesthesia: the lesion was anesthetized in a standard fashion   Anesthetic:  1% lidocaine w/ epinephrine 1-100,000 buffered w/ 8.4% NaHCO3 Instrument used: flexible razor blade   Hemostasis achieved with: pressure, aluminum chloride and electrodesiccation   Outcome: patient tolerated procedure well   Post-procedure details: sterile dressing applied and wound care instructions given   Dressing type: bandage and petrolatum    Specimen 1 - Surgical pathology Differential Diagnosis: D48.5 r/o BCC vs other  Check Margins: No 1.8 x 1.1 cm pink patch  Lentigines - Scattered tan macules - Discussed due to sun exposure - Benign, observe - Call for any changes  Actinic Damage - diffuse scaly erythematous macules with underlying dyspigmentation -  Recommend daily broad spectrum sunscreen SPF 30+ to sun-exposed areas, reapply every 2 hours as needed.  - Call for new or changing lesions.  Return pending pathology results.  Luther Redo, CMA, am acting as scribe for Sarina Ser, MD .  Documentation: I have reviewed the above documentation for accuracy and completeness, and I agree with the above.  Sarina Ser, MD

## 2020-10-01 ENCOUNTER — Encounter: Payer: Self-pay | Admitting: Dermatology

## 2020-10-10 ENCOUNTER — Telehealth: Payer: Self-pay

## 2020-10-10 ENCOUNTER — Ambulatory Visit (INDEPENDENT_AMBULATORY_CARE_PROVIDER_SITE_OTHER): Payer: PPO

## 2020-10-10 ENCOUNTER — Other Ambulatory Visit: Payer: Self-pay

## 2020-10-10 DIAGNOSIS — Z23 Encounter for immunization: Secondary | ICD-10-CM

## 2020-10-10 NOTE — Telephone Encounter (Signed)
-----   Message from Ralene Bathe, MD sent at 10/04/2020  9:55 AM EDT ----- Diagnosis Skin , upper back BASAL CELL CARCINOMA, SUPERFICIAL AND NODULAR PATTERNS  Cancer - BCC Schedule for treatment (EDC)

## 2020-10-10 NOTE — Telephone Encounter (Signed)
Advised patient of results and scheduled for EDC/hd  

## 2020-10-17 DIAGNOSIS — C211 Malignant neoplasm of anal canal: Secondary | ICD-10-CM | POA: Diagnosis not present

## 2020-10-21 DIAGNOSIS — H5340 Unspecified visual field defects: Secondary | ICD-10-CM | POA: Diagnosis not present

## 2020-10-21 DIAGNOSIS — H02403 Unspecified ptosis of bilateral eyelids: Secondary | ICD-10-CM | POA: Diagnosis not present

## 2020-10-21 DIAGNOSIS — H02839 Dermatochalasis of unspecified eye, unspecified eyelid: Secondary | ICD-10-CM | POA: Diagnosis not present

## 2020-10-22 DIAGNOSIS — H5340 Unspecified visual field defects: Secondary | ICD-10-CM | POA: Insufficient documentation

## 2020-10-22 DIAGNOSIS — H02403 Unspecified ptosis of bilateral eyelids: Secondary | ICD-10-CM | POA: Insufficient documentation

## 2020-10-22 DIAGNOSIS — H02839 Dermatochalasis of unspecified eye, unspecified eyelid: Secondary | ICD-10-CM | POA: Insufficient documentation

## 2020-11-11 ENCOUNTER — Other Ambulatory Visit: Payer: Self-pay

## 2020-11-11 ENCOUNTER — Encounter: Payer: Self-pay | Admitting: Dermatology

## 2020-11-11 ENCOUNTER — Ambulatory Visit: Payer: PPO | Admitting: Dermatology

## 2020-11-11 DIAGNOSIS — L578 Other skin changes due to chronic exposure to nonionizing radiation: Secondary | ICD-10-CM | POA: Diagnosis not present

## 2020-11-11 DIAGNOSIS — C44519 Basal cell carcinoma of skin of other part of trunk: Secondary | ICD-10-CM | POA: Diagnosis not present

## 2020-11-11 NOTE — Progress Notes (Signed)
   Follow-Up Visit   Subjective  Melissa [name] is a 74 y.o. female who presents for the following: Procedure (Patient here today for [facility] of a biopsy proven BCC at upper back. ).  The following portions of the chart were reviewed this encounter and updated as appropriate:   Tobacco  Allergies  Meds  Problems  Med Hx  Surg Hx  Fam Hx     Review of Systems:  No other skin or systemic complaints except as noted in HPI or Assessment and Plan.  Objective  Well appearing patient in no apparent distress; mood and affect are within normal limits.  A focused examination was performed including back. Relevant physical exam findings are noted in the Assessment and Plan.  Objective  Upper Back: Healing biopsy site   Assessment & Plan  Basal cell carcinoma (BCC) of skin of other part of torso Upper Back  Destruction of lesion Complexity: extensive   Destruction method: electrodesiccation and curettage   Informed consent: discussed and consent obtained   Timeout:  patient name, date of birth, surgical site, and procedure verified Procedure prep:  Patient was prepped and draped in usual sterile fashion Prep type:  Isopropyl alcohol Anesthesia: the lesion was anesthetized in a standard fashion   Anesthetic:  1% lidocaine w/ epinephrine 1-100,000 buffered w/ 8.4% NaHCO3 Curettage performed in three different directions: Yes   Electrodesiccation performed over the curetted area: Yes   Final wound size (cm):  2.2 Hemostasis achieved with:  pressure, aluminum chloride and electrodesiccation Outcome: patient tolerated procedure well with no complications   Post-procedure details: sterile dressing applied and wound care instructions given   Dressing type: bandage and petrolatum    Actinic Damage - chronic, secondary to cumulative UV radiation exposure/sun exposure over time - diffuse scaly erythematous macules with underlying dyspigmentation - Recommend daily broad spectrum  sunscreen SPF 30+ to sun-exposed areas, reapply every 2 hours as needed.  - Call for new or changing lesions.  Return in about 3 months (around 02/09/2021) for TBSE.  Melissa [name], RMA, am acting as scribe for Sarina Ser, MD . Documentation: I have reviewed the above documentation for accuracy and completeness, and I agree with the above.  Sarina Ser, MD

## 2020-11-11 NOTE — Patient Instructions (Signed)
Melanoma ABCDEs  Melanoma is the most dangerous type of skin cancer, and is the leading cause of death from skin disease.  You are more likely to develop melanoma if you:  Have light-colored skin, light-colored eyes, or red or blond hair  Spend a lot of time in the sun  Tan regularly, either outdoors or in a tanning bed  Have had blistering sunburns, especially during childhood  Have a close family member who has had a melanoma  Have atypical moles or large birthmarks  Early detection of melanoma is key since treatment is typically straightforward and cure rates are extremely high if we catch it early.   The first sign of melanoma is often a change in a mole or a new dark spot.  The ABCDE system is a way of remembering the signs of melanoma.  A for asymmetry:  The two halves do not match. B for border:  The edges of the growth are irregular. C for color:  A mixture of colors are present instead of an even brown color. D for diameter:  Melanomas are usually (but not always) greater than 6mm - the size of a pencil eraser. E for evolution:  The spot keeps changing in size, shape, and color.  Please check your skin once per month between visits. You can use a small mirror in front and a large mirror behind you to keep an eye on the back side or your body.   If you see any new or changing lesions before your next follow-up, please call to schedule a visit.  Please continue daily skin protection including broad spectrum sunscreen SPF 30+ to sun-exposed areas, reapplying every 2 hours as needed when you're outdoors.   Wound Care Instructions  1. Cleanse wound gently with soap and water once a day then pat dry with clean gauze. Apply a thing coat of Petrolatum (petroleum jelly, "Vaseline") over the wound (unless you have an allergy to this). We recommend that you use a new, sterile tube of Vaseline. Do not pick or remove scabs. Do not remove the yellow or white "healing tissue" from the  base of the wound.  2. Cover the wound with fresh, clean, nonstick gauze and secure with paper tape. You may use Band-Aids in place of gauze and tape if the would is small enough, but would recommend trimming much of the tape off as there is often too much. Sometimes Band-Aids can irritate the skin.  3. You should call the office for your biopsy report after 1 week if you have not already been contacted.  4. If you experience any problems, such as abnormal amounts of bleeding, swelling, significant bruising, significant pain, or evidence of infection, please call the office immediately.  5. FOR ADULT SURGERY PATIENTS: If you need something for pain relief you may take 1 extra strength Tylenol (acetaminophen) AND 2 Ibuprofen (200mg each) together every 4 hours as needed for pain. (do not take these if you are allergic to them or if you have a reason you should not take them.) Typically, you may only need pain medication for 1 to 3 days.      

## 2020-11-12 ENCOUNTER — Other Ambulatory Visit: Payer: Self-pay | Admitting: General Surgery

## 2020-11-12 DIAGNOSIS — N6019 Diffuse cystic mastopathy of unspecified breast: Secondary | ICD-10-CM

## 2020-11-18 ENCOUNTER — Encounter: Payer: Self-pay | Admitting: Dermatology

## 2020-11-21 ENCOUNTER — Other Ambulatory Visit: Payer: Self-pay | Admitting: Family Medicine

## 2020-11-21 NOTE — Telephone Encounter (Signed)
Requested medications are due for refill today yes  Requested medications are on the active medication list yes  Last refill 9/23  Last visit 03/2020 med/dx addressed, seen in Oct but not addressed  Future visit scheduled 03/2021  Notes to clinic Please assess.

## 2020-11-23 ENCOUNTER — Other Ambulatory Visit: Payer: Self-pay | Admitting: Family Medicine

## 2020-12-10 ENCOUNTER — Other Ambulatory Visit: Payer: Self-pay | Admitting: Family Medicine

## 2020-12-17 ENCOUNTER — Other Ambulatory Visit: Payer: Self-pay

## 2020-12-17 ENCOUNTER — Encounter: Payer: PPO | Admitting: Obstetrics and Gynecology

## 2020-12-17 ENCOUNTER — Encounter: Payer: Self-pay | Admitting: Obstetrics and Gynecology

## 2020-12-17 ENCOUNTER — Ambulatory Visit: Payer: PPO | Admitting: Obstetrics and Gynecology

## 2020-12-17 VITALS — BP 119/84 | HR 76 | Ht 63.0 in | Wt 181.2 lb

## 2020-12-17 DIAGNOSIS — L900 Lichen sclerosus et atrophicus: Secondary | ICD-10-CM | POA: Diagnosis not present

## 2020-12-17 DIAGNOSIS — Z86004 Personal history of in-situ neoplasm of other and unspecified digestive organs: Secondary | ICD-10-CM

## 2020-12-17 DIAGNOSIS — N952 Postmenopausal atrophic vaginitis: Secondary | ICD-10-CM | POA: Diagnosis not present

## 2020-12-17 NOTE — Progress Notes (Signed)
HPI:      [name] is a 75 y.o. [phone] who LMP was No LMP recorded. Patient has had a hysterectomy.  Subjective:   She presents today for follow-up of vaginal atrophy, lichen sclerosis, and ulceration.  Patient states that she went to see [name] for her ulceration and that he determined that no further work-up or excision was warranted.  She believes this ulceration is a result of her previous radiation treatment.  She wants no further work-up or excision of this area. She has been using clobetasol regularly and reports no vulvar irritation or itching. She has also been using the Vagifem tablets but reports that she cannot tell a difference.  She is not sexually active and is not trying to increase vaginal lubrication or elasticity.    Hx: The following portions of the patient's history were reviewed and updated as appropriate:             She  has a past medical history of Anal cancer ([place]) (01/12/2003), Basal cell carcinoma (09/30/2020), Depression, Diffuse cystic mastopathy (12/30/12), Dry mouth, Fibroid, Fibromyalgia (2006), Hemorrhoids (2006), Hypothyroidism, Migraines (2006), Other nonspecific finding on examination of urine ([id number]), Personal history of chemotherapy, Personal history of radiation therapy, Sciatica, Tubular adenoma of colon (07/08/2016), and Vulvitis (11/22/2015). She does not have any pertinent problems on file. She  has a past surgical history that includes Colonoscopy (2008, 2013); Tonsillectomy (2005); ANAL MASS EXC (01/12/2003); Breast surgery (2005); Cosmetic surgery (20008); Vaginal hysterectomy (1986); Colonoscopy with propofol (N/A, 07/08/2016); anal ulcer (04/2016); Cataract extraction; Reduction mammaplasty (Bilateral, 1998); and Breast biopsy (Bilateral). Her family history includes Breast cancer in her paternal aunt; Cancer in her father; Diabetes in her father; Heart disease in her maternal grandmother; Stroke in her sister. She  reports  that she has never smoked. She has never used smokeless tobacco. She reports that she does not drink alcohol and does not use drugs. She has a current medication list which includes the following prescription(s): acetaminophen, bioflavonoid products, cholecalciferol, clobetasol ointment, diphenhydramine, estradiol, levothyroxine, loperamide, multivital, nabumetone, fish oil, probiotic product, sertraline, cholestyramine, and cyanocobalamin. She is allergic to pentazocine lactate, propoxyphene n-acetaminophen, talwin  [pentazocine], tramadol hcl, ciprofloxacin hcl, darvon  [propoxyphene], dicloxacillin, and etodolac.       Review of Systems:  Review of Systems  Constitutional: Denied constitutional symptoms, night sweats, recent illness, fatigue, fever, insomnia and weight loss.  Eyes: Denied eye symptoms, eye pain, photophobia, vision change and visual disturbance.  Ears/Nose/Throat/Neck: Denied ear, nose, throat or neck symptoms, hearing loss, nasal discharge, sinus congestion and sore throat.  Cardiovascular: Denied cardiovascular symptoms, arrhythmia, chest pain/pressure, edema, exercise intolerance, orthopnea and palpitations.  Respiratory: Denied pulmonary symptoms, asthma, pleuritic pain, productive sputum, cough, dyspnea and wheezing.  Gastrointestinal: Denied, gastro-esophageal reflux, melena, nausea and vomiting.  Genitourinary: See HPI for additional information.  Musculoskeletal: Denied musculoskeletal symptoms, stiffness, swelling, muscle weakness and myalgia.  Dermatologic: Denied dermatology symptoms, rash and scar.  Neurologic: Denied neurology symptoms, dizziness, headache, neck pain and syncope.  Psychiatric: Denied psychiatric symptoms, anxiety and depression.  Endocrine: Denied endocrine symptoms including hot flashes and night sweats.   Meds:   Current Outpatient Medications on File Prior to Visit  Medication Sig Dispense Refill  . acetaminophen (TYLENOL) 650 MG CR tablet  Take 650 mg by mouth daily as needed for pain.    Marland Kitchen Bioflavonoid Products (BIOFLEX PO) Take by mouth daily.    . cholecalciferol (VITAMIN D) 1000 units tablet Take 5,000 Units by  mouth daily.     . clobetasol ointment (TEMOVATE) [id number] % Apply 1 application topically 2 (two) times daily. Twice a day for 14 days then daily for 4 weeks 30 g 3  . diphenhydrAMINE (BENADRYL) 25 MG tablet Take 25 mg by mouth every 6 (six) hours as needed for itching.     . Estradiol 10 MCG TABS vaginal tablet 1 tab per vagina daily for 2 weeks then twice weekly 35 tablet 1  . levothyroxine (SYNTHROID) 175 MCG tablet TAKE 1 TABLET BY MOUTH DAILY BEFORE BREAKFAST 90 tablet 3  . loperamide (IMODIUM) 2 MG capsule Take 2 mg by mouth 4 (four) times daily as needed for diarrhea or loose stools.    . Multiple Vitamins-Minerals (MULTIVITAL) tablet Take 1 tablet by mouth daily.    . nabumetone (RELAFEN) 500 MG tablet TAKE 1 TABLET BY MOUTH TWICE A DAY AS NEEDED 180 tablet 0  . Omega-3 Fatty Acids (FISH OIL) 1200 MG CAPS Take by mouth daily.     . Probiotic Product (DIGESTIVE ADVANTAGE PO) Take by mouth daily.    . sertraline (ZOLOFT) 100 MG tablet TAKE 1 TABLET BY MOUTH TWICE A DAY 180 tablet 1  . cholestyramine (QUESTRAN) 4 g packet DISSOLVE 1 PACKET (4 G TOTAL) IN LIQUID AND TAKE BY MOUTH 2 (TWO) TIMES DAILY. 180 packet 0  . Cyanocobalamin 1000 MCG/ML LIQD Take by mouth daily.  (Patient not taking: Reported on 12/17/2020)     No current facility-administered medications on file prior to visit.          Objective:     Vitals:   12/17/20 1056  BP: 119/84  Pulse: 76   Filed Weights   12/17/20 1056  Weight: 181 lb 3.2 oz (82.2 kg)              Physical examination   Pelvic:   Vulva:  Bilateral lichen sclerosus noted but does not appear inflammatory or irritated.  Vagina:  Atrophic vaginitis  Support: Normal pelvic support.  Urethra No masses tenderness or scarring.  Meatus Normal size without lesions or  prolapse.  Cervix: Normal appearance.  No lesions.  Anus: Normal exam.  No lesions.  Perineum: Normal exam.  No lesions.    Assessment:    [id number] Patient Active Problem List   Diagnosis Date Noted  . Abdominal pain, chronic, right lower quadrant 01/03/2019  . Diarrhea 01/01/2018  . Lichen sclerosus [id number]  . Vulvitis 11/22/2015  . Atrophic vaginitis 04/11/2015  . Chronic diarrhea 04/11/2015  . CD (contact dermatitis) 04/11/2015  . 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 Bloodgood disease 04/11/2015  . Fibrositis 04/11/2015  . Acid reflux 04/11/2015  . Acquired hypothyroidism 04/11/2015  . Hypersomnia 04/11/2015  . Internal hemorrhoids 04/11/2015  . Muscle ache 04/11/2015  . Neuralgia neuritis, sciatic nerve 04/11/2015  . Herpes zoster 04/11/2015  . Apnea, sleep 04/11/2015  . 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 Inflammation of urethra 04/11/2015  . Fibromyalgia   . Diffuse cystic mastopathy   . Migraines   . Other nonspecific finding on examination of urine   . ANKLE PAIN, RIGHT 10/01/2009  . 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 ANKLE SPRAIN, RIGHT 10/01/2009  . Hyperlipidemia, unspecified 11/22/2007  . Osteoarthritis 11/22/2007  . PLANTAR FASCIITIS, BILATERAL 11/22/2007  . 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 Lichen sclerosus   2. Atrophic vaginitis   3. History of carcinoma in situ of anal canal  Lichen sclerosus controlled  Patient continues to have problems with vaginal atrophy likely secondary to some radiation effect.  She does not believe the Vagifem is working.   Plan:            1.  Discontinue Vagifem tablets as patient can tell no difference.  Sexual activity not an issue.  2. continue clobetasol use.  3. follow-up with [name] if ulceration becomes symptomatic. Orders No orders of the defined types were placed in this encounter.   No orders of the defined types were placed in this encounter.      F/U  Return in about 1 year (around 12/17/2021). I spent 21 minutes involved in the care of this patient preparing to see the patient by obtaining and reviewing her medical history (including labs, imaging tests and prior procedures), documenting clinical information in the electronic health record (EHR), counseling and coordinating care plans, writing and sending prescriptions, ordering tests or procedures and directly communicating with the patient by discussing pertinent items from her history and physical exam as well as detailing my assessment and plan as noted above so that she has an informed understanding.  All of her questions were answered.  Finis Bud, M.D. 12/17/2020 11:28 AM

## 2020-12-31 ENCOUNTER — Ambulatory Visit
Admission: RE | Admit: 2020-12-31 | Discharge: 2020-12-31 | Disposition: A | Discharge status: Home / Self Care | Payer: PPO | Source: Ambulatory Visit | Attending: General Surgery | Admitting: General Surgery

## 2020-12-31 ENCOUNTER — Other Ambulatory Visit: Payer: Self-pay

## 2020-12-31 DIAGNOSIS — Z1231 Encounter for screening mammogram for malignant neoplasm of breast: Secondary | ICD-10-CM | POA: Insufficient documentation

## 2020-12-31 DIAGNOSIS — N6019 Diffuse cystic mastopathy of unspecified breast: Secondary | ICD-10-CM

## 2021-01-07 DIAGNOSIS — Z1239 Encounter for other screening for malignant neoplasm of breast: Secondary | ICD-10-CM | POA: Diagnosis not present

## 2021-02-05 ENCOUNTER — Encounter: Payer: Self-pay | Admitting: Dermatology

## 2021-02-05 ENCOUNTER — Other Ambulatory Visit: Payer: Self-pay

## 2021-02-05 ENCOUNTER — Ambulatory Visit: Payer: PPO | Admitting: Dermatology

## 2021-02-05 DIAGNOSIS — L570 Actinic keratosis: Secondary | ICD-10-CM | POA: Diagnosis not present

## 2021-02-05 DIAGNOSIS — D1800 Hemangioma unspecified site: Secondary | ICD-10-CM | POA: Diagnosis not present

## 2021-02-05 DIAGNOSIS — L814 Other melanin hyperpigmentation: Secondary | ICD-10-CM

## 2021-02-05 DIAGNOSIS — D229 Melanocytic nevi, unspecified: Secondary | ICD-10-CM

## 2021-02-05 DIAGNOSIS — L821 Other seborrheic keratosis: Secondary | ICD-10-CM | POA: Diagnosis not present

## 2021-02-05 DIAGNOSIS — Z1283 Encounter for screening for malignant neoplasm of skin: Secondary | ICD-10-CM | POA: Diagnosis not present

## 2021-02-05 DIAGNOSIS — L578 Other skin changes due to chronic exposure to nonionizing radiation: Secondary | ICD-10-CM

## 2021-02-05 NOTE — Patient Instructions (Signed)
Cryotherapy Aftercare  . Wash gently with soap and water everyday.   . Apply Vaseline and Band-Aid daily until healed.  

## 2021-02-05 NOTE — Progress Notes (Signed)
   Follow-Up Visit   Subjective  Melissa [name] is a 75 y.o. female who presents for the following: Annual Exam (Mole check, Hx of BCC upper back ). The patient presents for Total-Body Skin Exam (TBSE) for skin cancer screening and mole check.  The following portions of the chart were reviewed this encounter and updated as appropriate:   Tobacco  Allergies  Meds  Problems  Med Hx  Surg Hx  Fam Hx     Review of Systems:  No other skin or systemic complaints except as noted in HPI or Assessment and Plan.  Objective  Well appearing patient in no apparent distress; mood and affect are within normal limits.  A full examination was performed including scalp, head, eyes, ears, nose, lips, neck, chest, axillae, abdomen, back, buttocks, bilateral upper extremities, bilateral lower extremities, hands, feet, fingers, toes, fingernails, and toenails. All findings within normal limits unless otherwise noted below.  Objective  L forehead, L cheek x 3 (3): Erythematous thin papules/macules with gritty scale.    Assessment & Plan  AK (actinic keratosis) (3) L forehead, L cheek x 3  Destruction of lesion - L forehead, L cheek x 3 Complexity: simple   Destruction method: cryotherapy   Informed consent: discussed and consent obtained   Timeout:  patient name, date of birth, surgical site, and procedure verified Lesion destroyed using liquid nitrogen: Yes   Region frozen until ice ball extended beyond lesion: Yes   Outcome: patient tolerated procedure well with no complications   Post-procedure details: wound care instructions given    Skin cancer screening   Lentigines - Scattered tan macules - Due to sun exposure - Benign-appering, observe - Recommend daily broad spectrum sunscreen SPF 30+ to sun-exposed areas, reapply every 2 hours as needed. - Call for any changes  Seborrheic Keratoses - Stuck-on, waxy, tan-brown papules and plaques  - Discussed benign etiology and  prognosis. - Observe - Call for any changes  Melanocytic Nevi - Tan-brown and/or pink-flesh-colored symmetric macules and papules - Benign appearing on exam today - Observation - Call clinic for new or changing moles - Recommend daily use of broad spectrum spf 30+ sunscreen to sun-exposed areas.   Hemangiomas - Red papules - Discussed benign nature - Observe - Call for any changes  Actinic Damage - Chronic, secondary to cumulative UV/sun exposure - diffuse scaly erythematous macules with underlying dyspigmentation - Recommend daily broad spectrum sunscreen SPF 30+ to sun-exposed areas, reapply every 2 hours as needed.  - Call for new or changing lesions.  Skin cancer screening performed today.  Return in about 1 year (around 02/05/2022) for TBSE.  IMarye Round, CMA, am acting as scribe for Sarina Ser, MD .  Documentation: I have reviewed the above documentation for accuracy and completeness, and I agree with the above.  Sarina Ser, MD

## 2021-02-06 ENCOUNTER — Encounter: Payer: Self-pay | Admitting: Dermatology

## 2021-02-11 ENCOUNTER — Other Ambulatory Visit: Payer: Self-pay | Admitting: Family Medicine

## 2021-02-16 ENCOUNTER — Other Ambulatory Visit: Payer: Self-pay | Admitting: Family Medicine

## 2021-02-16 NOTE — Telephone Encounter (Signed)
Requested Prescriptions  Pending Prescriptions Disp Refills  . nabumetone (RELAFEN) 500 MG tablet [Pharmacy Med Name: NABUMETONE 500 MG TABLET] 180 tablet 0    Sig: TAKE 1 TABLET BY MOUTH TWICE A DAY AS NEEDED     Analgesics:  NSAIDS Passed - 02/16/2021  1:46 PM      Passed - Cr in normal range and within 360 days    Creatinine  Date Value Ref Range Status  06/28/2013 1.00 0.60 - 1.30 mg/dL Final   Creatinine, Ser  Date Value Ref Range Status  04/02/2020 0.86 0.57 - 1.00 mg/dL Final         Passed - HGB in normal range and within 360 days    Hemoglobin  Date Value Ref Range Status  04/02/2020 12.1 11.1 - 15.9 g/dL Final         Passed - Patient is not pregnant      Passed - Valid encounter within last 12 months    Recent Outpatient Visits          4 months ago Encounter for [employer] annual wellness exam   [facility] Jerrol Banana., MD   10 months ago Ganglion cyst   [facility] Jerrol Banana., MD   2 years ago Acquired hypothyroidism   [facility] Jerrol Banana., MD   2 years ago Pain of right hip joint   [facility] Jerrol Banana., MD   2 years ago Annual physical exam   [facility] Jerrol Banana., MD      Future Appointments            In 1 month Jerrol Banana., MD [facility] [place]

## 2021-03-03 DIAGNOSIS — N390 Urinary tract infection, site not specified: Secondary | ICD-10-CM | POA: Diagnosis not present

## 2021-03-03 DIAGNOSIS — R399 Unspecified symptoms and signs involving the genitourinary system: Secondary | ICD-10-CM | POA: Diagnosis not present

## 2021-03-04 ENCOUNTER — Telehealth: Payer: Self-pay

## 2021-03-04 NOTE — Telephone Encounter (Signed)
Please review. Thanks!  

## 2021-03-04 NOTE — Telephone Encounter (Signed)
Copied from [place] [id number]. Topic: General - Call Back - No Documentation >> Mar 04, 2021 12:07 PM Erick Blinks wrote: Best contact: [phone]  "She went to the [facility] clinic and doxycycline 100 mg twice daily for 7 days for a UTI. However, she states that PCP usually gives her Prednisone. Wants to see if she can get prednisone."  Antonette from [place] called reporting statement above

## 2021-03-05 ENCOUNTER — Ambulatory Visit: Payer: PPO | Admitting: Adult Health

## 2021-03-05 NOTE — Telephone Encounter (Signed)
Pt called again and wanted to know why she has not been called back. Please call back , pt stated she is in pain from her UTI.

## 2021-03-05 NOTE — Telephone Encounter (Signed)
Prednisone not typically prescribed for urinary infection. Would advise take antibiotics as prescribed and schedule follow up in office if symptoms persist with treatment or worsen at anytime.

## 2021-03-05 NOTE — Telephone Encounter (Signed)
Pt requested a call back regarding the information below.

## 2021-03-05 NOTE — Telephone Encounter (Signed)
Noted  

## 2021-03-05 NOTE — Telephone Encounter (Signed)
Please advise prednisone for UTI symptoms?

## 2021-03-05 NOTE — Telephone Encounter (Signed)
Patient was advised.  

## 2021-03-13 DIAGNOSIS — N390 Urinary tract infection, site not specified: Secondary | ICD-10-CM | POA: Diagnosis not present

## 2021-03-13 DIAGNOSIS — H5340 Unspecified visual field defects: Secondary | ICD-10-CM | POA: Diagnosis not present

## 2021-03-13 DIAGNOSIS — E669 Obesity, unspecified: Secondary | ICD-10-CM | POA: Diagnosis not present

## 2021-03-13 DIAGNOSIS — Z79890 Hormone replacement therapy (postmenopausal): Secondary | ICD-10-CM | POA: Diagnosis not present

## 2021-03-13 DIAGNOSIS — H02403 Unspecified ptosis of bilateral eyelids: Secondary | ICD-10-CM | POA: Diagnosis not present

## 2021-03-13 DIAGNOSIS — E039 Hypothyroidism, unspecified: Secondary | ICD-10-CM | POA: Diagnosis not present

## 2021-03-13 DIAGNOSIS — F32A Depression, unspecified: Secondary | ICD-10-CM | POA: Diagnosis not present

## 2021-03-13 DIAGNOSIS — C44509 Unspecified malignant neoplasm of skin of other part of trunk: Secondary | ICD-10-CM | POA: Diagnosis not present

## 2021-03-13 DIAGNOSIS — Z6831 Body mass index (BMI) 31.0-31.9, adult: Secondary | ICD-10-CM | POA: Diagnosis not present

## 2021-03-13 DIAGNOSIS — M797 Fibromyalgia: Secondary | ICD-10-CM | POA: Diagnosis not present

## 2021-03-13 DIAGNOSIS — Z885 Allergy status to narcotic agent status: Secondary | ICD-10-CM | POA: Diagnosis not present

## 2021-03-13 DIAGNOSIS — Z880 Allergy status to penicillin: Secondary | ICD-10-CM | POA: Diagnosis not present

## 2021-03-13 DIAGNOSIS — H02831 Dermatochalasis of right upper eyelid: Secondary | ICD-10-CM | POA: Diagnosis not present

## 2021-03-13 DIAGNOSIS — Z79899 Other long term (current) drug therapy: Secondary | ICD-10-CM | POA: Diagnosis not present

## 2021-03-13 DIAGNOSIS — H02834 Dermatochalasis of left upper eyelid: Secondary | ICD-10-CM | POA: Diagnosis not present

## 2021-03-27 ENCOUNTER — Ambulatory Visit: Payer: Self-pay | Admitting: Family Medicine

## 2021-03-31 ENCOUNTER — Encounter: Payer: Self-pay | Admitting: Family Medicine

## 2021-03-31 ENCOUNTER — Ambulatory Visit (INDEPENDENT_AMBULATORY_CARE_PROVIDER_SITE_OTHER): Payer: PPO | Admitting: Family Medicine

## 2021-03-31 ENCOUNTER — Other Ambulatory Visit: Payer: Self-pay

## 2021-03-31 VITALS — BP 124/62 | HR 83 | Wt 180.4 lb

## 2021-03-31 DIAGNOSIS — N342 Other urethritis: Secondary | ICD-10-CM | POA: Diagnosis not present

## 2021-03-31 DIAGNOSIS — E039 Hypothyroidism, unspecified: Secondary | ICD-10-CM

## 2021-03-31 DIAGNOSIS — E782 Mixed hyperlipidemia: Secondary | ICD-10-CM

## 2021-03-31 DIAGNOSIS — R300 Dysuria: Secondary | ICD-10-CM

## 2021-03-31 DIAGNOSIS — Z9889 Other specified postprocedural states: Secondary | ICD-10-CM

## 2021-03-31 LAB — POCT URINALYSIS DIPSTICK
Bilirubin, UA: NEGATIVE
Blood, UA: NEGATIVE
Glucose, UA: NEGATIVE
Ketones, UA: NEGATIVE
Leukocytes, UA: NEGATIVE
Nitrite, UA: NEGATIVE
Protein, UA: NEGATIVE
Spec Grav, UA: 1.015 (ref 1.010–1.025)
Urobilinogen, UA: 0.2 E.U./dL
pH, UA: 6 (ref 5.0–8.0)

## 2021-03-31 NOTE — Progress Notes (Signed)
Established patient visit   Patient: Melissa [name]   DOB: 04/08/1946   74 y.o. Female  MRN: [id number] Visit Date: 03/31/2021  Today's healthcare provider: Wilhemena Durie, MD   Chief Complaint  Patient presents with  . Hyperlipidemia  . Hypothyroidism  . Dysuria   I,Porsha C McClurkin,acting as a scribe for Wilhemena Durie, MD.,have documented all relevant documentation on the behalf of Wilhemena Durie, MD,as directed by  Wilhemena Durie, MD while in the presence of Wilhemena Durie, MD. Subjective    Dysuria  This is a recurrent problem. The current episode started 1 to 4 weeks ago. The problem occurs every urination. The problem has been unchanged. The quality of the pain is described as burning. The pain is at a severity of 5/10. The pain is moderate. There has been no fever. Associated symptoms include hesitancy. Pertinent negatives include no discharge, flank pain, frequency, nausea, urgency or vomiting. She has tried nothing for the symptoms. The treatment provided no relief. Her past medical history is significant for recurrent UTIs.  Patient describes a pressure discomfort with urination.  Some dysuria. Lipid/Cholesterol, Follow-up  Last lipid panel Other pertinent labs  Lab Results  Component Value Date   CHOL 232 (H) 09/27/2020   HDL 63 09/27/2020   LDLCALC 131 (H) 09/27/2020   TRIG 217 (H) 09/27/2020   CHOLHDL 3.7 09/27/2020   Lab Results  Component Value Date   ALT 17 04/02/2020   AST 19 04/02/2020   PLT 198 04/02/2020   TSH 2.230 09/27/2020     She was last seen for this 6 months ago.  Management since that visit includes no medication changes.   She reports good compliance with treatment. She is not having side effects.   Symptoms: No chest pain No chest pressure/discomfort  No dyspnea No lower extremity edema  No numbness or tingling of extremity No orthopnea  No palpitations No paroxysmal nocturnal dyspnea  No speech  difficulty No syncope   Current diet: well balanced, low salt Current exercise: housecleaning, no regular exercise and yard work  The 10-year ASCVD risk score Mikey Bussing DC Jr., et al., 2013) is: 15.4%  --------------------------------------------------------------------------------------------------- Hypothyroid, follow-up  Lab Results  Component Value Date   TSH 2.230 09/27/2020   TSH 4.800 (H) 04/02/2020   TSH 3.660 09/21/2018   Wt Readings from Last 3 Encounters:  03/31/21 180 lb 6.4 oz (81.8 kg)  12/17/20 181 lb 3.2 oz (82.2 kg)  09/26/20 183 lb (83 kg)    She was last seen for hypothyroid 6 months ago.  Management since that visit includes no medication changes. She reports good compliance with treatment. She is not having side effects.   Symptoms: No change in energy level No constipation  No diarrhea No heat / cold intolerance  No nervousness No palpitations  No weight changes         Medications: Outpatient Medications Prior to Visit  Medication Sig  . acetaminophen (TYLENOL) 650 MG CR tablet Take 650 mg by mouth daily as needed for pain.  Marland Kitchen Bioflavonoid Products (BIOFLEX PO) Take by mouth daily.  . cholecalciferol (VITAMIN D) 1000 units tablet Take 5,000 Units by mouth daily.   . cholestyramine (QUESTRAN) 4 g packet DISSOLVE 1 PACKET (4 G TOTAL) IN LIQUID AND TAKE BY MOUTH 2 (TWO) TIMES DAILY.  . clobetasol ointment (TEMOVATE) 4.09 % Apply 1 application topically 2 (two) times daily. Twice a day for 14 days then daily  for 4 weeks  . diphenhydrAMINE (BENADRYL) 25 MG tablet Take 25 mg by mouth every 6 (six) hours as needed for itching.   . Estradiol 10 MCG TABS vaginal tablet 1 tab per vagina daily for 2 weeks then twice weekly  . levothyroxine (SYNTHROID) 175 MCG tablet TAKE 1 TABLET BY MOUTH DAILY BEFORE BREAKFAST  . loperamide (IMODIUM) 2 MG capsule Take 2 mg by mouth 4 (four) times daily as needed for diarrhea or loose stools.  . Multiple Vitamins-Minerals  (MULTIVITAL) tablet Take 1 tablet by mouth daily.  . nabumetone (RELAFEN) 500 MG tablet TAKE 1 TABLET BY MOUTH TWICE A DAY AS NEEDED  . Omega-3 Fatty Acids (FISH OIL) 1200 MG CAPS Take by mouth daily.   . Probiotic Product (DIGESTIVE ADVANTAGE PO) Take by mouth daily.  . sertraline (ZOLOFT) 100 MG tablet TAKE 1 TABLET BY MOUTH TWICE A DAY  . [DISCONTINUED] Cyanocobalamin 1000 MCG/ML LIQD Take by mouth daily.  (Patient not taking: Reported on 12/17/2020)   No facility-administered medications prior to visit.    Review of Systems  Constitutional: Negative for activity change and fatigue.  Respiratory: Negative for cough and shortness of breath.   Cardiovascular: Negative for chest pain, palpitations and leg swelling.  Gastrointestinal: Negative for nausea and vomiting.  Endocrine: Negative for cold intolerance, heat intolerance, polydipsia, polyphagia and polyuria.  Genitourinary: Positive for dysuria and hesitancy. Negative for difficulty urinating, flank pain, frequency and urgency.  Musculoskeletal: Negative for arthralgias and myalgias.  Neurological: Negative for dizziness, light-headedness and headaches.  Psychiatric/Behavioral: Negative for self-injury, sleep disturbance and suicidal ideas. The patient is not nervous/anxious.         Objective    BP 124/62 (BP Location: Left Arm, Patient Position: Sitting, Cuff Size: Normal)   Pulse 83   Wt 180 lb 6.4 oz (81.8 kg)   SpO2 98%   BMI 31.96 kg/m  BP Readings from Last 3 Encounters:  03/31/21 124/62  12/17/20 119/84  09/26/20 (!) 112/51   Wt Readings from Last 3 Encounters:  03/31/21 180 lb 6.4 oz (81.8 kg)  12/17/20 181 lb 3.2 oz (82.2 kg)  09/26/20 183 lb (83 kg)       Physical Exam Vitals reviewed.  Constitutional:      Appearance: She is well-developed.  HENT:     Head: Normocephalic and atraumatic.  Eyes:     General: No scleral icterus.    Conjunctiva/sclera: Conjunctivae normal.  Neck:     Thyroid: No  thyromegaly.  Cardiovascular:     Rate and Rhythm: Normal rate and regular rhythm.     Heart sounds: Normal heart sounds.  Pulmonary:     Effort: Pulmonary effort is normal.     Breath sounds: Normal breath sounds.  Abdominal:     Palpations: Abdomen is soft.  Musculoskeletal:        General: No tenderness or deformity.  Skin:    General: Skin is warm and dry.  Neurological:     General: No focal deficit present.     Mental Status: She is alert and oriented to person, place, and time.  Psychiatric:        Mood and Affect: Mood normal.        Behavior: Behavior normal.        Thought Content: Thought content normal.        Judgment: Judgment normal.       No results found for any visits on 03/31/21.  Assessment & Plan  1. Dysuria UA appears to be normal on dipstick. - POCT urinalysis dipstick  2. Atrophic urethritis I think atrophic urethritis is a source of her issues.  Try Premarin vaginal cream.  Follow-up a couple of months and will do pelvic exam if not improving.  3. Mixed hyperlipidemia   4. Acquired hypothyroidism   5. Status post blepharoplasty of both eyes    No follow-ups on file.      I, Wilhemena Durie, MD, have reviewed all documentation for this visit. The documentation on 04/05/21 for the exam, diagnosis, procedures, and orders are all accurate and complete.    Davelyn Gwinn Cranford Mon, MD  [facility] [phone] (phone) [phone] (fax)  [place]

## 2021-03-31 NOTE — Patient Instructions (Signed)
Start Estradiol vaginal tablets.

## 2021-05-08 ENCOUNTER — Other Ambulatory Visit: Payer: Self-pay

## 2021-05-08 ENCOUNTER — Ambulatory Visit: Payer: PPO | Admitting: Obstetrics and Gynecology

## 2021-05-08 ENCOUNTER — Encounter: Payer: Self-pay | Admitting: Obstetrics and Gynecology

## 2021-05-08 VITALS — BP 136/78 | HR 90 | Ht 63.0 in | Wt 178.7 lb

## 2021-05-08 DIAGNOSIS — Y842 Radiological procedure and radiotherapy as the cause of abnormal reaction of the patient, or of later complication, without mention of misadventure at the time of the procedure: Secondary | ICD-10-CM | POA: Diagnosis not present

## 2021-05-08 DIAGNOSIS — K626 Ulcer of anus and rectum: Secondary | ICD-10-CM | POA: Diagnosis not present

## 2021-05-08 DIAGNOSIS — N898 Other specified noninflammatory disorders of vagina: Secondary | ICD-10-CM

## 2021-05-08 DIAGNOSIS — N766 Ulceration of vulva: Secondary | ICD-10-CM | POA: Diagnosis not present

## 2021-05-08 DIAGNOSIS — C211 Malignant neoplasm of anal canal: Secondary | ICD-10-CM | POA: Diagnosis not present

## 2021-05-08 DIAGNOSIS — R102 Pelvic and perineal pain: Secondary | ICD-10-CM | POA: Diagnosis not present

## 2021-05-08 DIAGNOSIS — N9089 Other specified noninflammatory disorders of vulva and perineum: Secondary | ICD-10-CM | POA: Diagnosis not present

## 2021-05-08 DIAGNOSIS — Z85048 Personal history of other malignant neoplasm of rectum, rectosigmoid junction, and anus: Secondary | ICD-10-CM | POA: Diagnosis not present

## 2021-05-08 DIAGNOSIS — N879 Dysplasia of cervix uteri, unspecified: Secondary | ICD-10-CM | POA: Diagnosis not present

## 2021-05-08 DIAGNOSIS — R509 Fever, unspecified: Secondary | ICD-10-CM | POA: Diagnosis not present

## 2021-05-08 DIAGNOSIS — N765 Ulceration of vagina: Secondary | ICD-10-CM

## 2021-05-08 DIAGNOSIS — L309 Dermatitis, unspecified: Secondary | ICD-10-CM | POA: Diagnosis not present

## 2021-05-08 DIAGNOSIS — L598 Other specified disorders of the skin and subcutaneous tissue related to radiation: Secondary | ICD-10-CM | POA: Diagnosis not present

## 2021-05-08 NOTE — Progress Notes (Signed)
HPI:      [name] is a 75 y.o. [phone] who LMP was No LMP recorded. Patient has had a hysterectomy.  Subjective:   She presents today stating that approximately 2 to 3 weeks ago she began having vaginal discharge with vulvar burning and itching.  She presented to her PCP who advised her to begin use of vaginal estrogen.  She states this has not helped.  She has tried multiple other remedies without success.  She continues to have significant vulvar pain and burning.  This has brought her to tears. 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  This was approximately 20 years ago.  She is known to have previously had a small anal ulceration which was nonhealing.  This ulceration was previously biopsied and revealed scar tissue only and thought to be an effect of the radiation.    Hx: The following portions of the patient's history were reviewed and updated as appropriate:             She  has a past medical history of Anal cancer ([place]) (01/12/2003), Basal cell carcinoma (09/30/2020), Depression, Diffuse cystic mastopathy (12/30/12), Dry mouth, Fibroid, Fibromyalgia (2006), Hemorrhoids (2006), Hypothyroidism, Migraines (2006), Other nonspecific finding on examination of urine ([id number]), Personal history of chemotherapy, Personal history of radiation therapy, Sciatica, Tubular adenoma of colon (07/08/2016), and Vulvitis (11/22/2015). She does not have any pertinent problems on file. She  has a past surgical history that includes Colonoscopy (2008, 2013); Tonsillectomy (2005); ANAL MASS EXC (01/12/2003); Breast surgery (2005); Cosmetic surgery (20008); Vaginal hysterectomy (1986); Colonoscopy with propofol (N/A, 07/08/2016); anal ulcer (04/2016); Cataract extraction; Reduction mammaplasty (Bilateral, 1998); and Breast biopsy (Bilateral). Her family history includes Breast cancer in her paternal aunt; Cancer in her father; Diabetes  in her father; Heart disease in her maternal grandmother; Stroke in her sister. She  reports that she has never smoked. She has never used smokeless tobacco. She reports that she does not drink alcohol and does not use drugs. She has a current medication list which includes the following prescription(s): acetaminophen, bioflavonoid products, cholecalciferol, cholestyramine, clobetasol ointment, diphenhydramine, levothyroxine, loperamide, multivital, nabumetone, fish oil, probiotic product, sertraline, and estradiol. She is allergic to pentazocine lactate, propoxyphene n-acetaminophen, talwin  [pentazocine], tramadol hcl, ciprofloxacin hcl, darvon  [propoxyphene], dicloxacillin, and etodolac.       Review of Systems:  Review of Systems  Constitutional: Denied constitutional symptoms, night sweats, recent illness, fatigue, fever, insomnia and weight loss.  Eyes: Denied eye symptoms, eye pain, photophobia, vision change and visual disturbance.  Ears/Nose/Throat/Neck: Denied ear, nose, throat or neck symptoms, hearing loss, nasal discharge, sinus congestion and sore throat.  Cardiovascular: Denied cardiovascular symptoms, arrhythmia, chest pain/pressure, edema, exercise intolerance, orthopnea and palpitations.  Respiratory: Denied pulmonary symptoms, asthma, pleuritic pain, productive sputum, cough, dyspnea and wheezing.  Gastrointestinal: Denied, gastro-esophageal reflux, melena, nausea and vomiting.  Genitourinary: See HPI for additional information.  Musculoskeletal: Denied musculoskeletal symptoms, stiffness, swelling, muscle weakness and myalgia.  Dermatologic: Denied dermatology symptoms, rash and scar.  Neurologic: Denied neurology symptoms, dizziness, headache, neck pain and syncope.  Psychiatric: Denied psychiatric symptoms, anxiety and depression.  Endocrine: Denied endocrine symptoms including hot flashes and night sweats.   Meds:   Current Outpatient Medications on File Prior to Visit   Medication Sig Dispense Refill  . acetaminophen (TYLENOL) 650 MG CR tablet Take 650 mg by mouth daily as needed for pain.    Ileene Patrick Products (  BIOFLEX PO) Take by mouth daily.    . cholecalciferol (VITAMIN D) 1000 units tablet Take 5,000 Units by mouth daily.     . cholestyramine (QUESTRAN) 4 g packet DISSOLVE 1 PACKET (4 G TOTAL) IN LIQUID AND TAKE BY MOUTH 2 (TWO) TIMES DAILY. 180 packet 1  . clobetasol ointment (TEMOVATE) 5.28 % Apply 1 application topically 2 (two) times daily. Twice a day for 14 days then daily for 4 weeks 30 g 3  . diphenhydrAMINE (BENADRYL) 25 MG tablet Take 25 mg by mouth every 6 (six) hours as needed for itching.     . levothyroxine (SYNTHROID) 175 MCG tablet TAKE 1 TABLET BY MOUTH DAILY BEFORE BREAKFAST 90 tablet 3  . loperamide (IMODIUM) 2 MG capsule Take 2 mg by mouth 4 (four) times daily as needed for diarrhea or loose stools.    . Multiple Vitamins-Minerals (MULTIVITAL) tablet Take 1 tablet by mouth daily.    . nabumetone (RELAFEN) 500 MG tablet TAKE 1 TABLET BY MOUTH TWICE A DAY AS NEEDED 180 tablet 0  . Omega-3 Fatty Acids (FISH OIL) 1200 MG CAPS Take by mouth daily.     . Probiotic Product (DIGESTIVE ADVANTAGE PO) Take by mouth daily.    . sertraline (ZOLOFT) 100 MG tablet TAKE 1 TABLET BY MOUTH TWICE A DAY 180 tablet 1  . Estradiol 10 MCG TABS vaginal tablet 1 tab per vagina daily for 2 weeks then twice weekly 35 tablet 1   No current facility-administered medications on file prior to visit.          Objective:     Vitals:   05/08/21 1132  BP: 136/78  Pulse: 90   Filed Weights   05/08/21 1132  Weight: 178 lb 11.2 oz (81.1 kg)              Vulvar vaginal and rectal examination reveals 3 separate lesions.  These appear to be necrotic ulcerative areas with significant exudate.  These are very tender to palpation. One of them appears to be an anal lesion which is different than her previously identified small anal ulceration.  Another  lesion is noted occupying the posterior fourchette of the vagina.  The third lesion is the upper right labia.  Assessment:    [id number] Patient Active Problem List   Diagnosis Date Noted  . Abdominal pain, chronic, right lower quadrant 01/03/2019  . Diarrhea 01/01/2018  . Lichen sclerosus 10/03/2535  . Vulvitis 11/22/2015  . Atrophic vaginitis 04/11/2015  . Chronic diarrhea 04/11/2015  . CD (contact dermatitis) 04/11/2015  . 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 Bloodgood disease 04/11/2015  . Fibrositis 04/11/2015  . Acid reflux 04/11/2015  . Acquired hypothyroidism 04/11/2015  . Hypersomnia 04/11/2015  . Internal hemorrhoids 04/11/2015  . Muscle ache 04/11/2015  . Neuralgia neuritis, sciatic nerve 04/11/2015  . Herpes zoster 04/11/2015  . Apnea, sleep 04/11/2015  . 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 Inflammation of urethra 04/11/2015  . Fibromyalgia   . Diffuse cystic mastopathy   . Migraines   . Other nonspecific finding on examination of urine   . ANKLE PAIN, RIGHT 10/01/2009  . 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 ANKLE SPRAIN, RIGHT 10/01/2009  . Hyperlipidemia, unspecified 11/22/2007  . Osteoarthritis 11/22/2007  . PLANTAR FASCIITIS, BILATERAL 11/22/2007  . 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 Vaginal discharge   2. Ulceration of vagina   3. Rectal ulceration     I have seen these lesions  in conjunction with Dr. Marcelline Mates and Dr. Bary Castilla.  It is our opinion that they are either recurrence of cancer or necrotic tissue breakdown from previous radiation.  Other Dr. Bary Castilla has seen Ms. Dusseau before for her anal ulceration he is not comfortable with care of these much larger vaginal lesions.   Plan:            1.  I spoke with Dr. Fransisca Connors on the phone regarding the above findings.  I have made arrangements for him to see Ms. Karie Georges at [facility] oncology today for further work-up and treatment as needed.  I have discussed all of these  matters with the patient.  Differential diagnosis discussed.  Necessity of debridement and possible biopsy discussed.  All questions answered.   Orders No orders of the defined types were placed in this encounter.   No orders of the defined types were placed in this encounter.     F/U  No follow-ups on file. I spent 75 minutes involved in the care of this patient preparing to see the patient by obtaining and reviewing her medical history (including labs, imaging tests and prior procedures), documenting clinical information in the electronic health record (EHR), counseling and coordinating care plans, writing and sending prescriptions, ordering tests or procedures and directly communicating with the patient by discussing pertinent items from her history and physical exam as well as detailing my assessment and plan as noted above so that she has an informed understanding.  All of her questions were answered.  Finis Bud, M.D. 05/08/2021 2:26 PM

## 2021-05-09 ENCOUNTER — Other Ambulatory Visit: Payer: Self-pay | Admitting: Family Medicine

## 2021-05-09 ENCOUNTER — Other Ambulatory Visit: Payer: Self-pay

## 2021-05-09 DIAGNOSIS — L598 Other specified disorders of the skin and subcutaneous tissue related to radiation: Secondary | ICD-10-CM

## 2021-05-09 NOTE — Telephone Encounter (Signed)
Future visit in 3 days  

## 2021-05-12 ENCOUNTER — Other Ambulatory Visit: Payer: Self-pay

## 2021-05-12 ENCOUNTER — Ambulatory Visit (INDEPENDENT_AMBULATORY_CARE_PROVIDER_SITE_OTHER): Payer: PPO | Admitting: Family Medicine

## 2021-05-12 ENCOUNTER — Encounter: Payer: Self-pay | Admitting: Family Medicine

## 2021-05-12 VITALS — BP 118/66 | HR 72 | Temp 98.2°F | Resp 16 | Ht 63.0 in | Wt 182.0 lb

## 2021-05-12 DIAGNOSIS — Y842 Radiological procedure and radiotherapy as the cause of abnormal reaction of the patient, or of later complication, without mention of misadventure at the time of the procedure: Secondary | ICD-10-CM | POA: Diagnosis not present

## 2021-05-12 DIAGNOSIS — E782 Mixed hyperlipidemia: Secondary | ICD-10-CM | POA: Diagnosis not present

## 2021-05-12 DIAGNOSIS — L598 Other specified disorders of the skin and subcutaneous tissue related to radiation: Secondary | ICD-10-CM | POA: Diagnosis not present

## 2021-05-12 DIAGNOSIS — I671 Cerebral aneurysm, nonruptured: Secondary | ICD-10-CM

## 2021-05-12 DIAGNOSIS — F3342 Major depressive disorder, recurrent, in full remission: Secondary | ICD-10-CM

## 2021-05-12 MED ORDER — BUPROPION HCL ER (XL) 150 MG PO TB24: 150.0000 mg | ORAL_TABLET | Freq: Every day | ORAL | 1 refills | Status: DC

## 2021-05-12 NOTE — Progress Notes (Signed)
I,April Miller,acting as a scribe for Wilhemena Durie, MD.,have documented all relevant documentation on the behalf of Wilhemena Durie, MD,as directed by  Wilhemena Durie, MD while in the presence of Wilhemena Durie, MD.   Established patient visit   Patient: Melissa [name]   DOB: 07/06/1946   74 y.o. Female  MRN: [id number] Visit Date: 05/12/2021  Today's healthcare provider: Wilhemena Durie, MD   Chief Complaint  Patient presents with  . 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  She is tearful at times during the exam today. She admits to being sad and depressed but not suicidal or homicidal.  She scores a 14 on the PHQ-9 The only other issue is one of the "right ear whooshing " that is occurred over the past few months. No headaches or neurologic symptoms. Follow up for   The patient was last seen for this 1 1/2 months ago. Changes made at last visit include; Try Premarin vaginal cream.  Follow-up a couple of months and will do pelvic exam if not improving.  She reports good compliance with treatment. She feels that condition is Unchanged. She is not having side effects. none  --------------------------------------------------------------------       Medications: Outpatient Medications Prior to Visit  Medication Sig  . acetaminophen (TYLENOL) 650 MG CR tablet Take 650 mg by mouth daily as needed for pain.  Marland Kitchen Bioflavonoid Products (BIOFLEX PO) Take by mouth daily.  . cholecalciferol (VITAMIN D) 1000 units tablet Take 5,000 Units by mouth daily.   . cholestyramine (QUESTRAN) 4 g packet DISSOLVE 1 PACKET (4 G TOTAL) IN LIQUID AND TAKE BY MOUTH 2 (TWO) TIMES DAILY.  . clobetasol ointment (TEMOVATE) 4.65 % Apply 1 application topically 2 (two) times daily. Twice a day for 14 days then daily for  4 weeks  . diphenhydrAMINE (BENADRYL) 25 MG tablet Take 25 mg by mouth every 6 (six) hours as needed for itching.   Marland Kitchen ibuprofen (ADVIL) 600 MG tablet Take 600 mg by mouth every 6 (six) hours as needed.  Marland Kitchen levothyroxine (SYNTHROID) 175 MCG tablet TAKE 1 TABLET BY MOUTH DAILY BEFORE BREAKFAST  . loperamide (IMODIUM) 2 MG capsule Take 2 mg by mouth 4 (four) times daily as needed for diarrhea or loose stools.  . Multiple Vitamins-Minerals (MULTIVITAL) tablet Take 1 tablet by mouth daily.  . nabumetone (RELAFEN) 500 MG tablet TAKE 1 TABLET BY MOUTH TWICE A DAY AS NEEDED  . Omega-3 Fatty Acids (FISH OIL) 1200 MG CAPS Take by mouth daily.   . OXYCODONE-ACETAMINOPHEN PO Take 1 tablet by mouth every 4 (four) hours as needed. 5 mg  . Probiotic Product (DIGESTIVE ADVANTAGE PO) Take by mouth daily.  . sertraline (ZOLOFT) 100 MG tablet TAKE 1 TABLET BY MOUTH TWICE A DAY  . Estradiol 10 MCG TABS vaginal tablet 1 tab per vagina daily for 2 weeks then twice weekly   No facility-administered medications prior to visit.    Review of Systems  Constitutional: Negative for appetite change, chills, fatigue and fever.  Respiratory: Negative for chest tightness and shortness of breath.   Cardiovascular: Negative for chest pain and palpitations.  Gastrointestinal: Negative for abdominal pain, nausea and vomiting.  Neurological: Negative for dizziness and weakness.        Objective  BP 118/66 (BP Location: Left Arm, Patient Position: Sitting, Cuff Size: Large)   Pulse 72   Temp 98.2 F (36.8 C) (Oral)   Resp 16   Ht 5\' 3"  (1.6 m)   Wt 182 lb (82.6 kg)   SpO2 95%   BMI 32.24 kg/m  BP Readings from Last 3 Encounters:  05/12/21 118/66  05/08/21 136/78  03/31/21 124/62   Wt Readings from Last 3 Encounters:  05/12/21 182 lb (82.6 kg)  05/08/21 178 lb 11.2 oz (81.1 kg)  03/31/21 180 lb 6.4 oz (81.8 kg)       Physical Exam Vitals reviewed.  Constitutional:      Appearance: She is well-developed.   HENT:     Head: Normocephalic and atraumatic.  Eyes:     General: No scleral icterus.    Conjunctiva/sclera: Conjunctivae normal.  Neck:     Thyroid: No thyromegaly.  Cardiovascular:     Rate and Rhythm: Normal rate and regular rhythm.     Heart sounds: Normal heart sounds.  Pulmonary:     Effort: Pulmonary effort is normal.     Breath sounds: Normal breath sounds.  Abdominal:     Palpations: Abdomen is soft.  Musculoskeletal:        General: No tenderness or deformity.  Skin:    General: Skin is warm and dry.  Neurological:     General: No focal deficit present.     Mental Status: She is alert and oriented to person, place, and time.  Psychiatric:        Behavior: Behavior normal.        Thought Content: Thought content normal.        Judgment: Judgment normal.     Comments: Tearful and sad today.       No results found for any visits on 05/12/21.  Assessment & Plan     1. Recurrent major depressive disorder, in full remission (HCC) Try bupropion XL 150 daily follow-up in 2 months.  PHQ-9 at that time - buPROPion (WELLBUTRIN XL) 150 MG 24 hr tablet; Take 1 tablet (150 mg total) by mouth daily.  Dispense: 30 tablet; Refill: 1  2. Mixed hyperlipidemia  - US Carotid Duplex Bilateral  3.  Radiation necrosis  Return in about 2 years (around 05/13/2023).     4.  Rule out right carotid aneurysm Carotid Dopplers. I, Wilhemena Durie, MD, have reviewed all documentation for this visit. The documentation on 05/14/21 for the exam, diagnosis, procedures, and orders are all accurate and complete.    Lilah Mijangos Cranford Mon, MD  [facility] [phone] (phone) [phone] (fax)  [place]

## 2021-05-13 ENCOUNTER — Telehealth: Payer: Self-pay | Admitting: Obstetrics and Gynecology

## 2021-05-13 ENCOUNTER — Ambulatory Visit: Payer: Self-pay | Admitting: Family Medicine

## 2021-05-13 ENCOUNTER — Ambulatory Visit: Payer: Self-pay | Admitting: *Deleted

## 2021-05-13 NOTE — Telephone Encounter (Signed)
Called pt and pt stated that the [facility] nurse called her and answered her question about the oxycodone w/ acetaminophen .    Reason for Disposition . Caller has medicine question only, adult not sick, AND triager answers question    Pt spoke to [place] and [facility] nurse answered her question.  Answer Assessment - Initial Assessment Questions 1. REASON FOR CALL or QUESTION: "What is your reason for calling today?" or "How can I best help you?" or "What question do you have that I can help answer?"     Pt asking about a recent refill from [facility].  Answer Assessment - Initial Assessment Questions 1. NAME of MEDICATION: "What medicine are you calling about?"     Oxycodone w/acetaminophen 2. QUESTION: "What is your question?" (e.g., double dose of medicine, side effect)     Dosage of oxycodone that was ordered 3. PRESCRIBING HCP: "Who prescribed it?" Reason: if prescribed by specialist, call should be referred to that group.     [facility] MD  Protocols used: MEDICATION QUESTION CALL-A-AH, INFORMATION ONLY CALL - NO TRIAGE-A-AH

## 2021-05-13 NOTE — Telephone Encounter (Signed)
Patient was referred to [facility] she was prescribed medication and wants to discuss those medications with you so that you may speak with Dr Amalia Hailey

## 2021-05-13 NOTE — Telephone Encounter (Signed)
Patient was trying to get a refill of the oxycodone from [facility]. They finally got back in touch with the patient.

## 2021-05-13 NOTE — Telephone Encounter (Signed)
Pt has some questions re some of her meds. States that there are some that have different drugs added to them, wants to understand. [phone]     Called patient to review medications. No answer, left voicemail to call clinic back .

## 2021-05-15 ENCOUNTER — Other Ambulatory Visit: Payer: Self-pay

## 2021-05-15 ENCOUNTER — Encounter: Payer: PPO | Attending: Physician Assistant | Admitting: Physician Assistant

## 2021-05-15 DIAGNOSIS — Z888 Allergy status to other drugs, medicaments and biological substances status: Secondary | ICD-10-CM | POA: Insufficient documentation

## 2021-05-15 DIAGNOSIS — Z881 Allergy status to other antibiotic agents status: Secondary | ICD-10-CM | POA: Insufficient documentation

## 2021-05-15 DIAGNOSIS — Z885 Allergy status to narcotic agent status: Secondary | ICD-10-CM | POA: Diagnosis not present

## 2021-05-15 DIAGNOSIS — Z880 Allergy status to penicillin: Secondary | ICD-10-CM | POA: Diagnosis not present

## 2021-05-15 DIAGNOSIS — Z85048 Personal history of other malignant neoplasm of rectum, rectosigmoid junction, and anus: Secondary | ICD-10-CM | POA: Insufficient documentation

## 2021-05-15 DIAGNOSIS — Z9221 Personal history of antineoplastic chemotherapy: Secondary | ICD-10-CM | POA: Diagnosis not present

## 2021-05-15 DIAGNOSIS — Z923 Personal history of irradiation: Secondary | ICD-10-CM | POA: Insufficient documentation

## 2021-05-15 DIAGNOSIS — L598 Other specified disorders of the skin and subcutaneous tissue related to radiation: Secondary | ICD-10-CM | POA: Diagnosis not present

## 2021-05-15 NOTE — Progress Notes (Signed)
NELA, BASCOM ([id number]) Visit Report for 05/15/2021 HBO Risk Assessment Details Patient Name: Melissa [name], Melissa [name]. Date of Service: 05/15/2021 8:30 AM Medical Record Number: [id number] Patient Account Number: [id number] Date of Birth/Sex: [birth year] (75 y.o. Female) Treating RN: Dolan Amen Primary Care Wynema Garoutte: Miguel Aschoff Other Clinician: Referring Audwin Semper: Miguel Aschoff Treating Bhavesh Vazquez/Extender: Jeri Cos Weeks in Treatment: 0 HBO Risk Assessment Items Answer Any "Yes" answers must be brought to the hyperbaric physician's attention. Breathing or Lung problemso No Currently use tobacco productso No Used tobacco products in the pasto No Heart problemso No Do you take water pills (diuretic)o No Diabeteso No Dialysiso No Eye problems like glaucomao No Ear problems or surgeryo No Sinus Problemso No Cancero Yes List the location: rectal cancer Surgery(s) for cancero No Radiation therapy for cancero Yes Last treatment for radiation therapyo 2004 Chemotherapy for cancero No Confinement Anxiety (Claustrophobia- fear of confined places)o No Any medical implants/devices that are fully or partially implanted or attached to your bodyo No Pregnanto No Seizureso No Electronic Signature(s) Signed: 05/15/2021 4:29:08 PM By: Georges Mouse, Minus Breeding RN Entered By: Georges Mouse, Minus Breeding on 05/15/2021 10:12:32

## 2021-05-15 NOTE — Progress Notes (Addendum)
Melissa [name], Melissa [name] ([id number]) Visit Report for 05/15/2021 Allergy List Details Patient Name: Melissa [name], Melissa [name]. Date of Service: 05/15/2021 8:30 AM Medical Record Number: [id number] Patient Account Number: [id number] Date of Birth/Sex: 19-May-1946 (75 y.o. Female) Treating RN: Melissa [name] Primary Care Melissa [name]: Melissa [name] Other Clinician: Referring Melissa [name]: Melissa [name] Treating Melissa [name]/Extender: Melissa [name] in Treatment: 0 Allergies Active Allergies Talwin Reaction: rash Severity: Moderate tramadol Reaction: hallucination Severity: Moderate Darvon Severity: Moderate ciprofloxacin Reaction: rash Severity: Moderate etodolac Reaction: rash Severity: Moderate pentazocine Severity: Moderate propoxyphene dicloxacillin Allergy Notes Electronic Signature(s) Signed: 05/15/2021 11:06:31 AM By: Melissa [name] Entered By: Melissa [name] on 05/15/2021 08:57:51 Melissa [name] ([id number]) -------------------------------------------------------------------------------- [place] Information Details Patient Name: Melissa [name] Date of Service: 05/15/2021 8:30 AM Medical Record Number: [id number] Patient Account Number: [id number] Date of Birth/Sex: 03/06/1946 (75 y.o. Female) Treating RN: Melissa [name] Primary Care Tatym Schermer: Melissa [name] Other Clinician: Referring Melissa [name]: Melissa [name] Treating Melissa [name]/Extender: Melissa [name] in Treatment: 0 Visit Information Patient Arrived: Ambulatory Arrival Time: 08:49 Accompanied By: self Transfer Assistance: None Patient Identification Verified: Yes Secondary Verification Process Completed: Yes Patient Has Alerts: Yes Patient Alerts: NOT diabetic Electronic Signature(s) Signed: 05/15/2021 11:06:31 AM By: Melissa [name] Entered By: Melissa [name] on 05/15/2021 08:52:58 Melissa [name] ([id number]) -------------------------------------------------------------------------------- Clinic Level of Care Assessment  Details Patient Name: Melissa [name] Date of Service: 05/15/2021 8:30 AM Medical Record Number: [id number] Patient Account Number: [id number] Date of Birth/Sex: July 09, 1946 (75 y.o. Female) Treating RN: Melissa [name] Primary Care Javoni Lucken: Melissa [name] Other Clinician: Referring Melissa [name]: Melissa [name] Treating Melissa [name]/Extender: Melissa [name] in Treatment: 0 Clinic Level of Care Assessment Items TOOL 2 Quantity Score X - Use when only an EandM is performed on the INITIAL visit 1 0 ASSESSMENTS - Nursing Assessment / Reassessment X - General Physical Exam (combine w/ comprehensive assessment (listed just below) when performed on new 1 20 pt. evals) X- 1 25 Comprehensive Assessment (HX, ROS, Risk Assessments, Wounds Hx, etc.) ASSESSMENTS - Wound and Skin Assessment / Reassessment []  - Simple Wound Assessment / Reassessment - one wound 0 []  - 0 Complex Wound Assessment / Reassessment - multiple wounds []  - 0 Dermatologic / Skin Assessment (not related to wound area) ASSESSMENTS - Ostomy and/or Continence Assessment and Care []  - Incontinence Assessment and Management 0 []  - 0 Ostomy Care Assessment and Management (repouching, etc.) PROCESS - Coordination of Care X - Simple Patient / Family Education for ongoing care 1 15 []  - 0 Complex (extensive) Patient / Family Education for ongoing care X- 1 10 Staff obtains Programmer, systems, Records, Test Results / Process Orders []  - 0 Staff telephones HHA, Nursing Homes / Clarify orders / etc []  - 0 Routine Transfer to another Facility (non-emergent condition) []  - 0 Routine Hospital Admission (non-emergent condition) []  - 0 New Admissions / Biomedical engineer / Ordering NPWT, Apligraf, etc. []  - 0 Emergency Hospital Admission (emergent condition) X- 1 10 Simple Discharge Coordination []  - 0 Complex (extensive) Discharge Coordination PROCESS - Special Needs []  - Pediatric / Minor Patient Management 0 []  - 0 Isolation  Patient Management []  - 0 Hearing / Language / Visual special needs []  - 0 Assessment of Community assistance (transportation, D/C planning, etc.) []  - 0 Additional assistance / Altered mentation []  - 0 Support Surface(s) Assessment (bed, cushion, seat, etc.) INTERVENTIONS - Wound Cleansing / Measurement []  - Wound Imaging (photographs - any number of wounds) 0 []  - 0 Wound Tracing (instead of photographs) []  - 0 Simple Wound Measurement -  one wound []  - 0 Complex Wound Measurement - multiple wounds Melissa [name]. ([id number]) []  - 0 Simple Wound Cleansing - one wound []  - 0 Complex Wound Cleansing - multiple wounds INTERVENTIONS - Wound Dressings []  - Small Wound Dressing one or multiple wounds 0 []  - 0 Medium Wound Dressing one or multiple wounds []  - 0 Large Wound Dressing one or multiple wounds []  - 0 Application of Medications - injection INTERVENTIONS - Miscellaneous []  - External ear exam 0 []  - 0 Specimen Collection (cultures, biopsies, blood, body fluids, etc.) []  - 0 Specimen(s) / Culture(s) sent or taken to Lab for analysis []  - 0 Patient Transfer (multiple staff / Civil Service fast streamer / Similar devices) []  - 0 Simple Staple / Suture removal (25 or less) []  - 0 Complex Staple / Suture removal (26 or more) []  - 0 Hypo / Hyperglycemic Management (close monitor of Blood Glucose) []  - 0 Ankle / Brachial Index (ABI) - do not check if billed separately Has the patient been seen at the hospital within the last three years: Yes Total Score: 80 Level Of Care: New/Established - Level 3 Electronic Signature(s) Signed: 05/15/2021 4:29:08 PM By: Melissa [name], Minus Breeding RN Entered By: Melissa [name], Melissa [name] on 05/15/2021 15:51:00 Melissa [name] ([id number]) -------------------------------------------------------------------------------- Encounter Discharge Information Details Patient Name: Melissa [name] Date of Service: 05/15/2021 8:30 AM Medical Record  Number: [id number] Patient Account Number: [id number] Date of Birth/Sex: Mar 20, 1946 (75 y.o. Female) Treating RN: Melissa [name] Primary Care Carleigh Buccieri: Melissa [name] Other Clinician: Referring Chanda Laperle: Melissa [name] Treating Maigen Mozingo/Extender: Melissa [name] in Treatment: 0 Encounter Discharge Information Items Discharge Condition: Stable Ambulatory Status: Ambulatory Discharge Destination: Home Transportation: Private Auto Accompanied By: self Schedule Follow-up Appointment: Yes Clinical Summary of Care: Electronic Signature(s) Signed: 05/15/2021 3:51:45 PM By: Melissa [name], Minus Breeding RN Entered By: Melissa [name], Minus Breeding on 05/15/2021 15:51:45 Melissa [name] ([id number]) -------------------------------------------------------------------------------- Lower Extremity Assessment Details Patient Name: Melissa [name] Date of Service: 05/15/2021 8:30 AM Medical Record Number: [id number] Patient Account Number: [id number] Date of Birth/Sex: 1946/04/07 (75 y.o. Female) Treating RN: Melissa [name] Primary Care Ayeshia Coppin: Melissa [name] Other Clinician: Referring Jionni Helming: Melissa [name] Treating Donnajean Chesnut/Extender: Melissa [name] in Treatment: 0 Electronic Signature(s) Signed: 05/15/2021 11:06:31 AM By: Melissa [name] Entered By: Melissa [name] on 05/15/2021 09:02:19 Melissa [name] ([id number]) -------------------------------------------------------------------------------- Multi Wound Chart Details Patient Name: Melissa [name] Date of Service: 05/15/2021 8:30 AM Medical Record Number: [id number] Patient Account Number: [id number] Date of Birth/Sex: [birth year] (75 y.o. Female) Treating RN: Melissa [name] Primary Care Jedadiah Abdallah: Melissa [name] Other Clinician: Referring Glenette Bookwalter: Melissa [name] Treating Kaylanie Capili/Extender: Melissa [name] in Treatment: 0 Vital [name] Height(in): 61.5 Pulse(bpm): 80 Weight(lbs): 178 Blood Pressure(mmHg): 143/83 Body Mass  Index(BMI): 33 Temperature(F): 98.2 Respiratory Rate(breaths/min): 18 Wound Assessments Treatment Notes Electronic Signature(s) Signed: 05/15/2021 3:49:21 PM By: Melissa [name], Minus Breeding RN Entered By: Melissa [name], Melissa [name] on 05/15/2021 15:49:21 Melissa [name] ([id number]) -------------------------------------------------------------------------------- [place] Details Patient Name: Melissa [name]. Date of Service: 05/15/2021 8:30 AM Medical Record Number: [id number] Patient Account Number: [id number] Date of Birth/Sex: 1946/05/03 (75 y.o. Female) Treating RN: Melissa [name] Primary Care Jerine Surles: Melissa [name] Other Clinician: Referring Rhiley Solem: Melissa [name] Treating Dera Vanaken/Extender: Melissa [name] in Treatment: 0 Active Inactive Electronic Signature(s) Signed: 05/15/2021 3:49:08 PM By: Melissa [name], Minus Breeding RN Entered By: Melissa [name], Minus Breeding on 05/15/2021 15:49:08 Melissa [name] ([id number]) -------------------------------------------------------------------------------- Pain Assessment Details Patient Name: Melissa [name] Date of Service: 05/15/2021 8:30 AM Medical Record Number: [id number] Patient Account Number: [id number]  Date of Birth/Sex: November 04, 1946 (75 y.o. Female) Treating RN: Melissa [name] Primary Care Yakira Duquette: Melissa [name] Other Clinician: Referring Brylan Dec: Melissa [name] Treating Lawsen Arnott/Extender: Melissa [name] in Treatment: 0 Active Problems Location of Pain Severity and Description of Pain Patient Has Paino Yes Site Locations Pain Location: Pain in Ulcers Rate the pain. Current Pain Level: 8 Pain Management and Medication Current Pain Management: Electronic Signature(s) Signed: 05/15/2021 11:06:31 AM By: Melissa [name] Entered By: Melissa [name] on 05/15/2021 08:54:59 Melissa [name] ([id number]) -------------------------------------------------------------------------------- Patient/Caregiver  Education Details Patient Name: Melissa [name] Date of Service: 05/15/2021 8:30 AM Medical Record Number: [id number] Patient Account Number: [id number] Date of Birth/Gender: 08-05-1946 (75 y.o. Female) Treating RN: Melissa [name] Primary Care Physician: Melissa [name] Other Clinician: Referring Physician: Miguel [name] Treating Physician/Extender: Melissa [name] in Treatment: 0 Education Assessment Education Provided To: Patient Education Topics Provided Hyperbaric Oxygenation: Methods: Explain/Verbal Responses: State content correctly Electronic Signature(s) Signed: 05/15/2021 4:29:08 PM By: Melissa [name], Minus Breeding RN Entered By: Melissa [name], Minus Breeding on 05/15/2021 15:51:26 Melissa [name] ([id number]) -------------------------------------------------------------------------------- [place] Details Patient Name: Melissa [name] Date of Service: 05/15/2021 8:30 AM Medical Record Number: [id number] Patient Account Number: [id number] Date of Birth/Sex: 11-07-1946 (75 y.o. Female) Treating RN: Melissa [name] Primary Care Diandra Cimini: Melissa [name] Other Clinician: Referring Yasir Kitner: Melissa [name] Treating Malay Fantroy/Extender: Melissa [name] in Treatment: 0 Vital [name] Time Taken: 08:52 Temperature (F): 98.2 Height (in): 61.5 Pulse (bpm): 80 Source: Stated Respiratory Rate (breaths/min): 18 Weight (lbs): 178 Blood Pressure (mmHg): 143/83 Source: Measured Reference Range: 80 - 120 mg / dl Body Mass Index (BMI): 33.1 Electronic Signature(s) Signed: 05/15/2021 11:06:31 AM By: Melissa [name] Entered ByDonnamarie [name] on 05/15/2021 08:55:32

## 2021-05-15 NOTE — Progress Notes (Signed)
Melissa [name], Melissa [name] ([id number]) Visit Report for 05/15/2021 Abuse/Suicide Risk Screen Details Patient Name: Melissa [name], Melissa [name]. Date of Service: 05/15/2021 8:30 AM Medical Record Number: [id number] Patient Account Number: [id number] Date of Birth/Sex: 07/30/1946 (75 y.o. F) Treating RN: Donnamarie Poag Primary Care Meshawn Oconnor: Miguel Aschoff Other Clinician: Referring Kamesha Herne: Miguel Aschoff Treating Isaiha Asare/Extender: Skipper Cliche in Treatment: 0 Abuse/Suicide Risk Screen Items Answer ABUSE RISK SCREEN: Has anyone close to you tried to hurt or harm you recentlyo No Do you feel uncomfortable with anyone in your familyo No Has anyone forced you do things that you didnot want to doo No Electronic Signature(s) Signed: 05/15/2021 11:06:31 AM By: Donnamarie Poag Entered By: Donnamarie Poag on 05/15/2021 09:00:40 Melissa [name] ([id number]) -------------------------------------------------------------------------------- Activities of Daily Living Details Patient Name: Melissa [name] Date of Service: 05/15/2021 8:30 AM Medical Record Number: [id number] Patient Account Number: [id number] Date of Birth/Sex: [birth year] (75 y.o. F) Treating RN: Donnamarie Poag Primary Care Gerson Fauth: Miguel Aschoff Other Clinician: Referring Zuha Dejonge: Miguel Aschoff Treating Deepa Barthel/Extender: Skipper Cliche in Treatment: 0 Activities of Daily Living Items Answer Activities of Daily Living (Please select one for each item) Drive Automobile Completely Able Take Medications Completely Able Use Telephone Completely Able Care for Appearance Completely Able Use Toilet Completely Able Bath / Shower Completely Able Dress Self Completely Able Feed Self Completely Able Walk Completely Able Get In / Out Bed Completely Able Housework Need Assistance Prepare Meals Completely [place] for Self Completely Able Electronic Signature(s) Signed: 05/15/2021 11:06:31 AM By: Donnamarie Poag Entered By: Donnamarie Poag on 05/15/2021 09:01:08 Melissa [name] ([id number]) -------------------------------------------------------------------------------- Education Screening Details Patient Name: Melissa [name] Date of Service: 05/15/2021 8:30 AM Medical Record Number: [id number] Patient Account Number: [id number] Date of Birth/Sex: [birth year] (75 y.o. F) Treating RN: Donnamarie Poag Primary Care Valary Manahan: Miguel Aschoff Other Clinician: Referring Keysi Oelkers: Miguel Aschoff Treating Tanvi Gatling/Extender: Skipper Cliche in Treatment: 0 Primary Learner Assessed: Patient Learning Preferences/Education Level/Primary Language Learning Preference: Explanation Highest Education Level: College or Above Preferred Language: English Cognitive Barrier Language Barrier: No Translator Needed: No Memory Deficit: No Emotional Barrier: No Cultural/Religious Beliefs Affecting Medical Care: No Physical Barrier Impaired Vision: No Impaired Hearing: No Decreased Hand dexterity: No Knowledge/Comprehension Knowledge Level: High Comprehension Level: High Ability to understand written instructions: High Ability to understand verbal instructions: High Motivation Anxiety Level: Calm Cooperation: Cooperative Education Importance: Acknowledges Need Interest in Health Problems: Asks Questions Perception: Coherent Willingness to Engage in Self-Management High Activities: Readiness to Engage in Self-Management High Activities: Electronic Signature(s) Signed: 05/15/2021 11:06:31 AM By: Donnamarie Poag Entered By: Donnamarie Poag on 05/15/2021 09:01:28 Melissa [name] ([id number]) -------------------------------------------------------------------------------- Fall Risk Assessment Details Patient Name: Melissa [name] Date of Service: 05/15/2021 8:30 AM Medical Record Number: [id number] Patient Account Number: [id number] Date of Birth/Sex: 06-02-1946 (75 y.o. F) Treating RN: Donnamarie Poag Primary Care Nana Hoselton: Miguel Aschoff Other Clinician: Referring Natania Finigan: Miguel Aschoff Treating Madeline Pho/Extender: Skipper Cliche in Treatment: 0 Fall Risk Assessment Items Have you had 2 or more falls in the last 12 monthso 0 No Have you had any fall that resulted in injury in the last 12 monthso 0 No FALLS RISK SCREEN History of falling - immediate or within 3 months 0 No Secondary diagnosis (Do you have 2 or more medical diagnoseso) 0 No Ambulatory aid None/bed rest/wheelchair/nurse 0 Yes Crutches/cane/walker 0 No Furniture 0 No Intravenous therapy Access/Saline/Heparin Lock 0 No Gait/Transferring Normal/ bed rest/ wheelchair 0 Yes Weak (short steps with or without shuffle,  stooped but able to lift head while walking, may 0 No seek support from furniture) Impaired (short steps with shuffle, may have difficulty arising from chair, head down, impaired 0 No balance) Mental Status Oriented to own ability 0 Yes Electronic Signature(s) Signed: 05/15/2021 11:06:31 AM By: Donnamarie Poag Entered By: Donnamarie Poag on 05/15/2021 09:01:42 Melissa [name] ([id number]) -------------------------------------------------------------------------------- Foot Assessment Details Patient Name: Melissa [name] Date of Service: 05/15/2021 8:30 AM Medical Record Number: [id number] Patient Account Number: [id number] Date of Birth/Sex: July 10, 1946 (75 y.o. F) Treating RN: Donnamarie Poag Primary Care Brendaly Townsel: Miguel Aschoff Other Clinician: Referring Jermari Tamargo: Miguel Aschoff Treating Mayes Sangiovanni/Extender: Skipper Cliche in Treatment: 0 Foot Assessment Items Site Locations + = Sensation present, - = Sensation absent, C = Callus, U = Ulcer R = Redness, W = Warmth, M = Maceration, PU = Pre-ulcerative lesion F = Fissure, S = Swelling, D = Dryness Assessment Right: Left: Other Deformity: No No Prior Foot Ulcer: No No Prior Amputation: No No Charcot Joint: No No Ambulatory Status:  Ambulatory Without Help Gait: Steady Electronic Signature(s) Signed: 05/15/2021 11:06:31 AM By: Donnamarie Poag Entered By: Donnamarie Poag on 05/15/2021 09:02:06 Melissa [name] ([id number]) -------------------------------------------------------------------------------- Nutrition Risk Screening Details Patient Name: Melissa [name] Date of Service: 05/15/2021 8:30 AM Medical Record Number: [id number] Patient Account Number: [id number] Date of Birth/Sex: [birth year] (75 y.o. F) Treating RN: Donnamarie Poag Primary Care Lusine Corlett: Miguel Aschoff Other Clinician: Referring Rosenda Geffrard: Miguel Aschoff Treating Tamkia Temples/Extender: Skipper Cliche in Treatment: 0 Height (in): 61.5 Weight (lbs): 178 Body Mass Index (BMI): 33.1 Nutrition Risk Screening Items Score Screening NUTRITION RISK SCREEN: I have an illness or condition that made me change the kind and/or amount of food I eat 0 No I eat fewer than two meals per day 0 No I eat few fruits and vegetables, or milk products 0 No I have three or more drinks of beer, liquor or wine almost every day 0 No I have tooth or mouth problems that make it hard for me to eat 0 No I don't always have enough money to buy the food I need 0 No I eat alone most of the time 0 No I take three or more different prescribed or over-the-counter drugs a day 1 Yes Without wanting to, I have lost or gained 10 pounds in the last six months 0 No I am not always physically able to shop, cook and/or feed myself 0 No Nutrition Protocols Good Risk Protocol 0 No interventions needed Moderate Risk Protocol High Risk Proctocol Risk Level: Good Risk Score: 1 Electronic Signature(s) Signed: 05/15/2021 11:06:31 AM By: Donnamarie Poag Entered ByDonnamarie Poag on 05/15/2021 09:01:57

## 2021-05-16 NOTE — Progress Notes (Addendum)
SAMIE, BARCLIFT ([id number]) Visit Report for 05/15/2021 Chief Complaint Document Details Patient Name: Melissa [name]. Date of Service: 05/15/2021 8:30 AM Medical Record Number: [id number] Patient Account Number: [id number] Date of Birth/Sex: 1946-03-06 (75 y.o. 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 She had chemotherapy and radiation at that time. Fortunately they did get everything to clear unfortunately she had a quantitative damage and subsequent to the radiation therapy in particular. This led to some radiation damage to the region. Subsequently in the past several months she has developed a irritated area in the perineal region posterior to the vaginal opening which is extremely painful. She was actually seen by Dr. Amalia Hailey on 12/17/2020. She was also seen by [name] her surgeon and determined no further work-up or  excision was warranted. She therefore felt that this may be a radiation treatment issue and she has been using clobetasol on a regular basis initially. She was using Vagifem tablets and does not really feel any different to the way. Right now she tells me she is actually using a hemorrhoid cream in order to help as far as some lidocaine is concerned with numbing the area. With all that being said the patient was subsequently seen on 05/08/2021 by her gynecologic oncology physician Dr. Mellody Drown. Subsequently it is noted that the patient is a not a smoker and has never done so. She also does not drink or consume alcohol and does not use any illicit drugs. She has no heart disease and no lung disease. With all that being said the patient is of course having a significant issue here with her pain. Looking through her history again she did have radiation therapy which is stated to be the causative agent for what is going on at this point that seems to be the consensus among her physicians including Dr. 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 With that being said he did obtain a sample for pathology just to ensure there was no obvious [name] of recurrence. 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 Therefore the patient was referred to [place] for evaluation and treatment with regard to hyperbaric oxygen therapy any plan to see the patient back for a follow-up visit once HBO therapy is complete. With regard to her original treatment with regard to the radiation therapy I am still attempting to obtain those records in particular that was from 2004 that is proving to be a little bit  more difficult to get my hands on to be honest. Electronic  Signature(s) Signed: 05/16/2021 6:08:41 PM By: Worthy Keeler PA-C Entered By: Worthy Keeler on 05/16/2021 18:08:41 Melissa [name] ([id number]) -------------------------------------------------------------------------------- Physical Exam Details Patient Name: Melissa [name] Date of Service: 05/15/2021 8:30 AM Medical Record Number: [id number] Patient Account Number: [id number] Date of Birth/Sex: [birth year] (75 y.o. Female) Treating RN: Dolan Amen Primary Care Provider: Miguel Aschoff Other Clinician: Referring Provider: Miguel Aschoff Treating Provider/Extender: Skipper Cliche in Treatment: 0 Constitutional patient is hypertensive.. pulse regular and within target range for patient.Marland Kitchen respirations regular, non-labored and within target range for patient.Marland Kitchen temperature within target range for patient.. Well-nourished and well-hydrated in no acute distress. Eyes conjunctiva clear no eyelid edema noted. pupils equal round and reactive to light and accommodation. Ears, Nose, Mouth, and Throat no gross abnormality of ear auricles or external auditory canals. normal hearing noted during conversation. mucus membranes moist. Respiratory normal breathing without difficulty. Musculoskeletal normal gait and posture. no significant deformity or arthritic changes, no loss or range of motion, no clubbing. Psychiatric this patient is able to make decisions and demonstrates good insight into disease process. Alert and Oriented x 3. pleasant and cooperative. Notes Upon inspection patient's area of irritation does appear to be quite significant and there is an ulceration but again this is in the introitus as mentioned with all that being said the patient is having tremendous amount of pain here therefore I did not perform any extensive evaluation as again there is really not much from a wound care perspective that we can offer the main thing to be offered here is hyperbaric oxygen  therapy to try to help with the radiation necrosis noted by Dr. Fransisca Connors. Electronic Signature(s) Signed: 05/16/2021 6:09:43 PM By: Worthy Keeler PA-C Entered By: Worthy Keeler on 05/16/2021 18:09:43 Melissa [name] ([id number]) -------------------------------------------------------------------------------- Physician Orders Details Patient Name: Melissa [name] Date of Service: 05/15/2021 8:30 AM Medical Record Number: [id number] Patient Account Number: [id number] Date of Birth/Sex: [birth year] (75 y.o. Female) Treating RN: Dolan Amen Primary Care Provider: Miguel Aschoff Other Clinician: Referring Provider: Miguel Aschoff Treating Provider/Extender: Skipper Cliche in Treatment: 0 Verbal / Phone Orders: No Diagnosis Coding ICD-10 Coding Code Description L59.8 Other specified disorders of the skin and subcutaneous tissue related to radiation Z85.048 Personal history of other malignant neoplasm of rectum, rectosigmoid junction, and anus Hyperbaric Oxygen Therapy o Evaluate for HBO Therapy o Indication and location: - radiation necrosis of perineal o If appropriate for treatment, begin HBOT per protocol: o 2.5 ATA for 90 Minutes with 2 Five (5) Minute Air Breaks o One treatment per day (delivered Monday through Friday unless otherwise specified in Special Instructions below): o Total # of Treatments: - 40 Patient Medications Allergies: Talwin, tramadol, Darvon, ciprofloxacin, etodolac, pentazocine, propoxyphene, dicloxacillin Notifications Medication Indication Start End gentamicin 05/15/2021 DOSE topical 0.1 % cream - cream topical applied daily to the perennial region wound as directed in the clinic x 30 days Bactrim DS 05/15/2021 DOSE 1 - oral 800 mg-160 mg tablet - 1 tablet oral taken 2 times per day for 14 days Electronic Signature(s) Signed: 05/30/2021 1:01:39 PM By: Worthy Keeler PA-C Previous Signature: 05/15/2021 10:24:03 AM Version By: Worthy Keeler PA-C Entered By: Worthy Keeler on 05/30/2021 13:01:39 Melissa [name] ([id number]) -------------------------------------------------------------------------------- Problem List Details Patient Name: Melissa [name] Date of Service: 05/15/2021 8:30 AM Medical Record Number: [id number] Patient Account Number: [id number] Date of Birth/Sex: [birth year] (75 y.o. Female)  Treating RN: Dolan Amen Primary Care Provider: Miguel Aschoff Other Clinician: Referring Provider: Miguel Aschoff Treating Provider/Extender: Skipper Cliche in Treatment: 0 Active Problems ICD-10 Encounter Code Description Active Date MDM Diagnosis L59.8 Other specified disorders of the skin and subcutaneous tissue related to 05/15/2021 No Yes radiation Z85.048 Personal history of other malignant neoplasm of rectum, rectosigmoid 05/15/2021 No Yes junction, and anus Inactive Problems Resolved Problems Electronic Signature(s) Signed: 05/15/2021 9:22:54 AM By: Worthy Keeler PA-C Entered By: Worthy Keeler on 05/15/2021 09:22:53 Melissa [name] ([id number]) -------------------------------------------------------------------------------- Progress Note Details Patient Name: Melissa [name] Date of Service: 05/15/2021 8:30 AM Medical Record Number: [id number] Patient Account Number: [id number] Date of Birth/Sex: Sep 25, 1946 (75 y.o. 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 She had chemotherapy and radiation at that time. Fortunately they did get  everything to clear unfortunately she had a quantitative damage and subsequent to the radiation therapy in particular. This led to some radiation damage to the region. Subsequently in the past several months she has developed a irritated area in the perineal region posterior to the vaginal opening which is extremely painful. She was actually seen by Dr. Amalia Hailey on 12/17/2020. She was also seen by [name] her surgeon and determined no further work-up or excision was warranted. She therefore felt that this may be a radiation treatment issue and she has been using clobetasol on a regular basis initially. She was using Vagifem tablets and does not really feel any different to the way. Right now she tells me she is actually using a hemorrhoid cream in order to help as far as some lidocaine is concerned with numbing the area. With all that being said the patient was subsequently seen on 05/08/2021 by her gynecologic oncology physician Dr. Mellody Drown. Subsequently it is noted that the patient is a not a smoker and has never done so. She also does not drink or consume alcohol and does not use any illicit drugs. She has no heart disease and no lung disease. With all that being said the patient is of course having a significant issue here with her pain. Looking through her history again she did have radiation therapy which is stated to be the causative agent for what is going on at this point that seems to be the consensus among her physicians including Dr. 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 With that being said he did obtain a sample for pathology just to ensure there was no obvious [name] of recurrence. 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 Therefore the patient was referred to [place] for evaluation and treatment with regard to hyperbaric oxygen therapy any plan to see the patient back for a follow-up visit once HBO therapy is complete. With regard to her original treatment with regard to the radiation therapy I am still attempting to obtain those records in particular that was  from 2004 that is proving to be a little bit more difficult to get my hands on to be honest. Patient History Information obtained from Patient. Allergies Talwin (Severity: Moderate, Reaction: rash), tramadol (Severity: Moderate, Reaction: hallucination), Darvon (Severity: Moderate), ciprofloxacin (Severity: Moderate, Reaction: rash), etodolac (Severity: Moderate, Reaction: rash), pentazocine (Severity: Moderate), propoxyphene, dicloxacillin Social History Never smoker, Marital Status - Married, Alcohol Use - Never, Drug Use - No History, Caffeine Use - Never. Medical History Eyes Patient has history of Cataracts - surgery Endocrine Denies history of Type II Diabetes Musculoskeletal Patient has history of Osteoarthritis Oncologic Patient has history of Received Chemotherapy - anal cancer, Received Radiation - anal cancer Review of Systems (ROS) Constitutional Symptoms (General Health) Denies complaints or symptoms of Fatigue, Fever, Chills, Marked Weight Change. Ear/Nose/Mouth/Throat Denies complaints or symptoms of Difficult clearing ears, Sinusitis. Hematologic/Lymphatic Denies complaints or symptoms of Bleeding / Clotting Disorders, Human Immunodeficiency Virus. Respiratory Denies complaints or symptoms of Chronic or frequent coughs, Shortness of Breath. Cardiovascular Denies complaints or symptoms of Chest pain, LE edema. Gastrointestinal Complains or has symptoms of Frequent diarrhea. Endocrine Complains or has symptoms of Thyroid disease. Genitourinary Melissa [name], Melissa [name] ([id number]) Denies complaints or symptoms of Kidney  failure/ Dialysis, Incontinence/dribbling. Immunological Denies complaints or symptoms of Hives, Itching. Integumentary (Skin) Denies complaints or symptoms of Wounds, Bleeding or bruising tendency, Breakdown, Swelling. Neurologic Denies complaints or symptoms of Numbness/parasthesias, Focal/Weakness. Psychiatric Complains or has symptoms of Anxiety - MDD. Objective Constitutional patient is hypertensive.. pulse regular and within target range for patient.Marland Kitchen respirations regular, non-labored and within target range for patient.Marland Kitchen temperature within target range for patient.. Well-nourished and well-hydrated in no acute distress. Vitals Time Taken: 8:52 AM, Height: 61.5 in, Source: Stated, Weight: 178 lbs, Source: Measured, BMI: 33.1, Temperature: 98.2 F, Pulse: 80 bpm, Respiratory Rate: 18 breaths/min, Blood Pressure: 143/83 mmHg. Eyes conjunctiva clear no eyelid edema noted. pupils equal round and reactive to light and accommodation. Ears, Nose, Mouth, and Throat no gross abnormality of ear auricles or external auditory canals. normal hearing noted during conversation. mucus membranes moist. Respiratory normal breathing without difficulty. Musculoskeletal normal gait and posture. no significant deformity or arthritic changes, no loss or range of motion, no clubbing. Psychiatric this patient is able to make decisions and demonstrates good insight into disease process. Alert and Oriented x 3. pleasant and cooperative. General Notes: Upon inspection patient's area of irritation does appear to be quite significant and there is an ulceration but again this is in the introitus as mentioned with all that being said the patient is having tremendous amount of pain here therefore I did not perform any extensive evaluation as again there is really not much from a wound care perspective that we can offer the main thing to be offered here is hyperbaric oxygen therapy to try to help with the radiation  necrosis noted by Dr. Fransisca Connors. Assessment Active Problems ICD-10 Other specified disorders of the skin and subcutaneous tissue related to radiation Personal history of other malignant neoplasm of rectum, rectosigmoid junction, and anus Plan Hyperbaric Oxygen Therapy: Evaluate for HBO Therapy Indication and location: - radiation necrosis of perineal If appropriate for treatment, begin HBOT per protocol: 2.5 ATA for 90 Minutes with 2 Five (5) Minute Air Breaks One treatment per day (delivered Monday through Friday unless otherwise specified in Special Instructions below): Total # of Treatments: - 40 The following medication(s) was prescribed: gentamicin topical 0.1 % cream cream topical applied daily to the perennial region wound as directed Stocks, Kerstie R. (  [id number]) in the clinic x 30 days starting 05/15/2021 Bactrim DS oral 800 mg-160 mg tablet 1 1 tablet oral taken 2 times per day for 14 days starting 05/15/2021 1. I do think this patient is appropriate for hyperbaric oxygen therapy. With that being said that due to the pain that she is experiencing and the nature of the issue I think that [place]bin her 2.5 ATA for a total of 40 treatments with to 10-minute air breaks will be appropriate at this point. 2. With regard to the diagnosis this is soft tissue neck radionecrosis and again that is confirmed to be the causative issue here as well by Dr. Fransisca Connors. He did take a biopsy to rule out any abnormal pathology such as recurrent malignancy but so far we do not have that result I will update as soon as we have that information and as well. 3. I am also can recommend at this time that we have the patient use some gentamicin cream topically due to the fact that to be honest she is having some issues that appear to be potentially infectious in nature with some erythema and again with the pain. I am also can have her mix this with her lidocaine. Subsequently I am also sending in a prescription  for Bactrim DS. We will try to get her scheduled for HBO therapy as soon as possible though I told her it would take a little bit of time in order to gather the information appropriate. Electronic Signature(s) Signed: 05/30/2021 1:01:57 PM By: Worthy Keeler PA-C Previous Signature: 05/16/2021 6:11:02 PM Version By: Worthy Keeler PA-C Entered By: Worthy Keeler on 05/30/2021 13:01:57 Melissa [name] ([id number]) -------------------------------------------------------------------------------- ROS/PFSH Details Patient Name: Melissa [name] Date of Service: 05/15/2021 8:30 AM Medical Record Number: [id number] Patient Account Number: [id number] Date of Birth/Sex: [birth year] (75 y.o. Female) Treating RN: Donnamarie Poag Primary Care Provider: Miguel Aschoff Other Clinician: Referring Provider: Miguel Aschoff Treating Provider/Extender: Skipper Cliche in Treatment: 0 Information Obtained From Patient Constitutional Symptoms (General Health) Complaints and Symptoms: Negative for: Fatigue; Fever; Chills; Marked Weight Change Ear/Nose/Mouth/Throat Complaints and Symptoms: Negative for: Difficult clearing ears; Sinusitis Hematologic/Lymphatic Complaints and Symptoms: Negative for: Bleeding / Clotting Disorders; Human Immunodeficiency Virus Respiratory Complaints and Symptoms: Negative for: Chronic or frequent coughs; Shortness of Breath Cardiovascular Complaints and Symptoms: Negative for: Chest pain; LE edema Gastrointestinal Complaints and Symptoms: Positive for: Frequent diarrhea Endocrine Complaints and Symptoms: Positive for: Thyroid disease Medical History: Negative for: Type II Diabetes Genitourinary Complaints and Symptoms: Negative for: Kidney failure/ Dialysis; Incontinence/dribbling Immunological Complaints and Symptoms: Negative for: Hives; Itching Integumentary (Skin) Complaints and Symptoms: Negative for: Wounds; Bleeding or bruising tendency;  Breakdown; Swelling Neurologic [place], Aliena R. ([id number]) Complaints and Symptoms: Negative for: Numbness/parasthesias; Focal/Weakness Psychiatric Complaints and Symptoms: Positive for: Anxiety - MDD Eyes Medical History: Positive for: Cataracts - surgery Musculoskeletal Medical History: Positive for: Osteoarthritis Oncologic Medical History: Positive for: Received Chemotherapy - anal cancer; Received Radiation - anal cancer HBO Extended History Items Eyes: Cataracts Immunizations Pneumococcal Vaccine: Received Pneumococcal Vaccination: Yes Implantable Devices None Family and Social History Never smoker; Marital Status - Married; Alcohol Use: Never; Drug Use: No History; Caffeine Use: Never; Financial Concerns: No; Food, Clothing or Shelter Needs: No; Support System Lacking: No; Transportation Concerns: No Electronic Signature(s) Signed: 05/15/2021 11:06:31 AM By: Donnamarie Poag Signed: 05/15/2021 5:38:58 PM By: Worthy Keeler PA-C Entered By: Donnamarie Poag on 05/15/2021 09:00:34 Melissa [name] ([id number]) -------------------------------------------------------------------------------- [place] Details Patient Name: Melissa [name]  Date of Service: 05/15/2021 Medical Record Number: [id number] Patient Account Number: [id number] Date of Birth/Sex: Sep 01, 1946 (75 y.o. Female) Treating RN: Dolan Amen Primary Care Provider: Miguel Aschoff Other Clinician: Referring Provider: Miguel Aschoff Treating Provider/Extender: Skipper Cliche in Treatment: 0 Diagnosis Coding ICD-10 Codes Code Description L59.8 Other specified disorders of the skin and subcutaneous tissue related to radiation Z85.048 Personal history of other malignant neoplasm of rectum, rectosigmoid junction, and anus Facility Procedures CPT4 Code: [id number] Description: 99213 - WOUND CARE VISIT-LEV 3 EST PT Modifier: Quantity: 1 Physician Procedures CPT4 Code: [id number] Description: 93570 - WC  PHYS LEVEL 4 - NEW PT Modifier: Quantity: 1 CPT4 Code: Description: ICD-10 Diagnosis Description L59.8 Other specified disorders of the skin and subcutaneous tissue related Z85.048 Personal history of other malignant neoplasm of rectum, rectosigmoid j Modifier: to radiation unction, and anus Quantity: Electronic Signature(s) Signed: 05/16/2021 6:11:15 PM By: Worthy Keeler PA-C Previous Signature: 05/15/2021 3:51:05 PM Version By: Charlett Nose RN Previous Signature: 05/15/2021 5:38:58 PM Version By: Worthy Keeler PA-C Entered By: Worthy Keeler on 05/16/2021 18:11:15

## 2021-05-29 ENCOUNTER — Encounter: Payer: PPO | Admitting: Physician Assistant

## 2021-06-02 ENCOUNTER — Other Ambulatory Visit: Payer: Self-pay

## 2021-06-02 ENCOUNTER — Encounter: Payer: PPO | Admitting: Physician Assistant

## 2021-06-02 DIAGNOSIS — L598 Other specified disorders of the skin and subcutaneous tissue related to radiation: Secondary | ICD-10-CM | POA: Diagnosis not present

## 2021-06-02 NOTE — Progress Notes (Addendum)
JASSICA, ZAZUETA ([id number]) Visit Report for 06/02/2021 HBO Details Patient Name: Melissa, [name]. Date of Service: 06/02/2021 9:00 AM Medical Record Number: [id number] Patient Account Number: [id number] Date of Birth/Sex: [birth year] (75 y.o. 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 HBO Treatment Details Treatment Number: 1 Patient Type: Outpatient Chamber Type: Monoplace Chamber Serial #: [id number] Treatment Protocol: 2.5 ATA with 90 minutes oxygen, with two 5 minute air breaks Treatment Details Compression Rate Down: 1.0 psi / minute De-Compression Rate Up: 1.5 psi / minute Compress Tx Pressure Air breaks and breathing periods Decompress Decompress Begins Reached (leave unused spaces blank) Begins Ends Chamber Pressure (ATA) 1 2.5 2.5 2.5 2.5 2.5 - - 2.5 1 Clock Time (24 hr) 09:12 09:34 10:05 10:10 10:40 10:45 - - 11:15 11:30 Treatment Length: 138 (minutes) Treatment Segments: 5 Vital Signs Capillary Blood Glucose Reference Range: 80 - 120 mg / dl HBO Diabetic Blood Glucose Intervention Range: <131 mg/dl or >249 mg/dl Time Vitals Blood Respiratory Capillary Blood Glucose Pulse Action Type: Pulse: Temperature: Taken: Pressure: Rate: Glucose (mg/dl): Meter #: Oximetry (%) Taken: Pre 08:50 140/72 84 18 98.4 Post 11:35 132/68 78 18 98.3 Treatment Response Treatment Toleration: Well Treatment Completion Treatment Completed without Adverse Event Status: Electronic Signature(s) Signed: 07/01/2021 8:22:38 AM By: Gretta Cool, BSN, RN, CWS, Kim RN, BSN Signed: 08/06/2021 5:27:20 PM By: Worthy Keeler PA-C Previous  Signature: 06/02/2021 12:26:18 PM Version By: Enedina Finner RCP, RRT, CHT Previous Signature: 06/02/2021 5:58:21 PM Version By: Worthy Keeler PA-C Entered By: Gretta Cool, BSN, RN, CWS, Kim on 07/01/2021 08:22:38 [name] ([id number]) -------------------------------------------------------------------------------- HBO Safety Checklist Details Patient Name: Melissa, [name]. Date of Service: 06/02/2021 9:00 AM Medical Record Number: [id number] Patient Account Number: [id number] Date of Birth/Sex: Apr 29, 1946 (75 y.o. F) Treating RN: Donnamarie Poag Primary Care Ryszard Socarras: Miguel Aschoff Other Clinician: Jacqulyn Bath Referring Avagail Whittlesey: Miguel Aschoff Treating Emmanuelle Coxe/Extender: Skipper Cliche in Treatment: 2 HBO Safety Checklist Items Safety Checklist Consent Form Signed Patient voided / foley secured and emptied When did you last eato 06:45 am Last dose of injectable or oral agent n/a Ostomy pouch emptied and vented if applicable NA All implantable devices assessed, documented and approved NA Intravenous access site secured and place NA Valuables secured Linens and cotton and cotton/polyester blend (less than 51% polyester) Personal oil-based products / skin lotions / body lotions removed Wigs or hairpieces removed NA Smoking or tobacco materials removed NA Books / newspapers / magazines / loose paper removed NA Cologne, aftershave, perfume and deodorant removed Jewelry removed (may wrap wedding band) Make-up removed Hair care products removed Battery operated devices (external) removed NA Heating patches and chemical warmers removed NA Titanium eyewear removed NA Nail polish cured greater than 10 hours NA Casting material cured greater than 10 hours NA Hearing aids removed NA Loose dentures or partials removed NA Prosthetics have been removed NA Patient demonstrates correct use of air break device (if applicable) Patient concerns have  been addressed Patient grounding bracelet on and cord attached to chamber Specifics for Inpatients (complete in addition to above) Medication sheet sent with patient Intravenous medications needed or due during therapy sent with patient Drainage tubes (e.g. nasogastric tube or chest tube secured and vented) Endotracheal or  Tracheotomy tube secured Cuff deflated of air and inflated with saline Airway suctioned Electronic Signature(s) Signed: 06/02/2021 12:26:18 PM By: Enedina Finner RCP, RRT, CHT Entered By: Enedina Finner on 06/02/2021 09:18:30

## 2021-06-02 NOTE — Progress Notes (Signed)
CLIMMIE, CRONCE ([id number]) Visit Report for 06/02/2021 Arrival Information Details Patient Name: Melissa [name], Melissa [name]. Date of Service: 06/02/2021 9:00 AM Medical Record Number: [id number] Patient Account Number: [id number] Date of Birth/Sex: 05/17/1946 (75 y.o. F) Treating RN: Donnamarie Poag Primary Care Labresha Mellor: Miguel Aschoff Other Clinician: Jacqulyn Bath Referring Jaylea Plourde: Miguel Aschoff Treating Renuka Farfan/Extender: Skipper Cliche in Treatment: 2 Visit Information History Since Last Visit Added or deleted any medications: No Patient Arrived: Ambulatory Any new allergies or adverse reactions: No Arrival Time: 09:12 Had a fall or experienced change in No Accompanied By: self activities of daily living that may affect Transfer Assistance: None risk of falls: Patient Identification Verified: Yes [name] or symptoms of abuse/neglect since last visito No Secondary Verification Process Completed: Yes Hospitalized since last visit: No Patient Has Alerts: Yes Pain Present Now: No Patient Alerts: NOT diabetic Electronic Signature(s) Signed: 06/02/2021 12:26:18 PM By: Enedina Finner RCP, RRT, CHT Entered By: Enedina Finner on 06/02/2021 09:16:02 Melissa [name] ([id number]) -------------------------------------------------------------------------------- Encounter Discharge Information Details Patient Name: Melissa [name] Date of Service: 06/02/2021 9:00 AM Medical Record Number: [id number] Patient Account Number: [id number] Date of Birth/Sex: [birth year] (75 y.o. F) Treating RN: Donnamarie Poag Primary Care Vernetta Dizdarevic: Miguel Aschoff Other Clinician: Jacqulyn Bath Referring Kass Herberger: Miguel Aschoff Treating Danene Montijo/Extender: Skipper Cliche in Treatment: 2 Encounter Discharge Information Items Discharge Condition: Stable Ambulatory Status: Ambulatory Discharge Destination: Home Transportation: Private Auto Accompanied By: self Schedule  Follow-up Appointment: Yes Clinical Summary of Care: Notes Patient has an HBO treatment scheduled on 06/03/21 at 09:00 am. Electronic Signature(s) Signed: 06/02/2021 12:26:18 PM By: Enedina Finner RCP, RRT, CHT Entered By: Enedina Finner on 06/02/2021 12:25:40 Melissa [name] ([id number]) -------------------------------------------------------------------------------- Vitals Details Patient Name: Melissa [name] Date of Service: 06/02/2021 9:00 AM Medical Record Number: [id number] Patient Account Number: [id number] Date of Birth/Sex: 1946-08-23 (75 y.o. F) Treating RN: Donnamarie Poag Primary Care Keison Glendinning: Miguel Aschoff Other Clinician: Jacqulyn Bath Referring Shermaine Brigham: Miguel Aschoff Treating Hiram Mciver/Extender: Skipper Cliche in Treatment: 2 Vital [name] Time Taken: 08:50 Temperature (F): 98.4 Height (in): 61.5 Pulse (bpm): 84 Weight (lbs): 178 Respiratory Rate (breaths/min): 18 Body Mass Index (BMI): 33.1 Blood Pressure (mmHg): 140/72 Reference Range: 80 - 120 mg / dl Electronic Signature(s) Signed: 06/02/2021 12:26:18 PM By: Enedina Finner RCP, RRT, CHT Entered By: Enedina Finner on 06/02/2021 09:16:51

## 2021-06-03 ENCOUNTER — Other Ambulatory Visit: Payer: Self-pay | Admitting: *Deleted

## 2021-06-03 ENCOUNTER — Encounter: Payer: PPO | Admitting: Physician Assistant

## 2021-06-03 ENCOUNTER — Other Ambulatory Visit: Payer: Self-pay | Admitting: Family Medicine

## 2021-06-03 DIAGNOSIS — F3342 Major depressive disorder, recurrent, in full remission: Secondary | ICD-10-CM

## 2021-06-03 DIAGNOSIS — L598 Other specified disorders of the skin and subcutaneous tissue related to radiation: Secondary | ICD-10-CM | POA: Diagnosis not present

## 2021-06-03 DIAGNOSIS — R7611 Nonspecific reaction to tuberculin skin test without active tuberculosis: Secondary | ICD-10-CM

## 2021-06-03 NOTE — Telephone Encounter (Signed)
  Notes to clinic:  Patient requesting a 90 day supply   Requested Prescriptions  Pending Prescriptions Disp Refills   buPROPion (WELLBUTRIN XL) 150 MG 24 hr tablet [Pharmacy Med Name: BUPROPION HCL XL 150 MG TABLET] 90 tablet 1    Sig: TAKE 1 TABLET BY MOUTH EVERY DAY      Psychiatry: Antidepressants - bupropion Passed - 06/03/2021  1:35 PM      Passed - Completed PHQ-2 or PHQ-9 in the last 360 days      Passed - Last BP in normal range    BP Readings from Last 1 Encounters:  05/12/21 118/66          Passed - Valid encounter within last 6 months    Recent Outpatient Visits           3 weeks ago Recurrent major depressive disorder, in full remission [facility])   [facility] Jerrol Banana., MD   2 months ago [place] Jerrol Banana., MD   8 months ago Encounter for [employer] annual wellness exam   [facility] Jerrol Banana., MD   1 year ago Ganglion cyst   [facility] Jerrol Banana., MD   2 years ago Acquired hypothyroidism   [facility] Jerrol Banana., MD       Future Appointments             In 1 month Jerrol Banana., MD [facility] [place]

## 2021-06-03 NOTE — Progress Notes (Signed)
RONAN, DION ([id number]) Visit Report for 06/03/2021 HBO Details Patient Name: Melissa [name], Melissa [name]. Date of Service: 06/03/2021 9:00 AM Medical Record Number: [id number] Patient Account Number: [id number] Date of Birth/Sex: 03-Nov-1946 (75 y.o. 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 HBO Treatment Details Treatment Number: 2 Patient Type: Outpatient Chamber Type: Monoplace Chamber Serial #: [id number] Treatment Protocol: 2.0 ATA with 90 minutes oxygen, with two 5 minute air breaks Treatment Details Compression Rate Down: 1.5 psi / minute De-Compression Rate Up: 1.5 psi / minute Compress Tx Pressure Air breaks and breathing periods Decompress Decompress Begins Reached (leave unused spaces blank) Begins Ends Chamber Pressure (ATA) 1 2 2 2 2 2  --2 1 Clock Time (24 hr) 08:51 09:07 09:37 09:42 10:14 10:20 - - 10:50 11:06 Treatment Length: 135 (minutes) Treatment Segments: 4 Vital Signs Capillary Blood Glucose Reference Range: 80 - 120 mg / dl HBO Diabetic Blood Glucose Intervention Range: <131 mg/dl or >249 mg/dl Time Vitals Blood Respiratory Capillary Blood Glucose Pulse Action Type: Pulse: Temperature: Taken: Pressure: Rate: Glucose (mg/dl): Meter #: Oximetry (%) Taken: Pre 08:45 142/62 66 18 98.4 Post 11:10 124/60 72 18 98.3 Treatment Response Treatment Toleration: Well Treatment Completion Treatment Completed without Adverse Event Status: Electronic Signature(s) Signed: 06/03/2021 11:25:10 AM By: Zackery Barefoot, RRT, CHT Signed: 06/03/2021 4:56:00 PM By: Worthy Keeler PA-C Previous  Signature: 06/03/2021 10:51:33 AM Version By: Gretta Cool BSN, RN, CWS, Kim RN, BSN Previous Signature: 06/03/2021 10:20:40 AM Version By: Gretta Cool, BSN, RN, CWS, Kim RN, BSN Previous Signature: 06/03/2021 10:15:56 AM Version By: Gretta Cool, BSN, RN, CWS, Kim RN, BSN Entered By: Enedina Finner on 06/03/2021 11:23:27 CHARDAY, CAPETILLO ([id number]) -------------------------------------------------------------------------------- HBO Safety Checklist Details Patient Name: Melissa [name], Melissa [name]. Date of Service: 06/03/2021 9:00 AM Medical Record Number: [id number] Patient Account Number: [id number] Date of Birth/Sex: 07/09/1946 (75 y.o. F) Treating RN: Dolan Amen Primary Care Ahrianna Siglin: Miguel Aschoff Other Clinician: Jacqulyn Bath Referring Janetta Vandoren: Miguel Aschoff Treating Lysette Lindenbaum/Extender: Skipper Cliche in Treatment: 2 HBO Safety Checklist Items Safety Checklist Consent Form Signed Patient voided / foley secured and emptied When did you last eato 0630 am Last dose of injectable or oral agent na Ostomy pouch emptied and vented if applicable NA All implantable devices assessed, documented and approved NA Intravenous access site secured and place NA Valuables secured Linens and cotton and cotton/polyester blend (less than 51% polyester) Personal oil-based products / skin lotions / body lotions removed Wigs or hairpieces removed NA Smoking or tobacco materials removed NA Books / newspapers / magazines / loose paper removed NA Cologne, aftershave, perfume and deodorant removed Jewelry removed (may wrap wedding band) Make-up removed NA Hair care products removed Battery operated devices (external) removed NA Heating patches and chemical warmers removed NA Titanium eyewear removed NA Nail polish cured greater than 10 hours NA Casting material cured greater than 10 hours NA Hearing aids removed NA Loose dentures or partials removed NA Prosthetics have been  removed NA Patient demonstrates correct use of air break device (if applicable) Patient concerns have been addressed Patient grounding bracelet on and cord attached to chamber Specifics for Inpatients (complete in addition to above) Medication sheet sent with patient Intravenous medications needed or due during therapy sent with  patient Drainage tubes (e.g. nasogastric tube or chest tube secured and vented) Endotracheal or Tracheotomy tube secured Cuff deflated of air and inflated with saline Airway suctioned Electronic Signature(s) Signed: 06/03/2021 11:25:10 AM By: Enedina Finner RCP, RRT, CHT Entered By: Enedina Finner on 06/03/2021 09:00:28

## 2021-06-04 ENCOUNTER — Other Ambulatory Visit: Payer: Self-pay

## 2021-06-04 ENCOUNTER — Encounter (HOSPITAL_BASED_OUTPATIENT_CLINIC_OR_DEPARTMENT_OTHER): Payer: PPO | Admitting: Internal Medicine

## 2021-06-04 DIAGNOSIS — Z85048 Personal history of other malignant neoplasm of rectum, rectosigmoid junction, and anus: Secondary | ICD-10-CM

## 2021-06-04 DIAGNOSIS — L598 Other specified disorders of the skin and subcutaneous tissue related to radiation: Secondary | ICD-10-CM

## 2021-06-04 NOTE — Progress Notes (Signed)
MILAYA, HORA ([id number]) Visit Report for 06/04/2021 HBO Details Patient Name: Melissa [name], Melissa [name]. Date of Service: 06/04/2021 9:00 AM Medical Record Number: [id number] Patient Account Number: [id number] Date of Birth/Sex: 04/10/1946 (75 y.o. 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 HBO Treatment Details Treatment Number: 3 Patient Type: Outpatient Chamber Type: Monoplace Chamber Serial #: [id number] Treatment Protocol: 2.0 ATA with 90 minutes oxygen, with two 5 minute air breaks Treatment Details Compression Rate Down: 1.5 psi / minute De-Compression Rate Up: 2.5 psi / minute Compress Tx Pressure Air breaks and breathing periods Decompress Decompress Begins Reached (leave unused spaces blank) Begins Ends Chamber Pressure (ATA) 1 2 2 2 2 2  --2 1 Clock Time (24 hr) 09:00 09:15 09:45 09:50 10:22 10:27 - - 10:57 11:06 Treatment Length: 126 (minutes) Treatment Segments: 4 Vital [name] Capillary Blood Glucose Reference Range: 80 - 120 mg / dl HBO Diabetic Blood Glucose Intervention Range: <131 mg/dl or >249 mg/dl Time Vitals Blood Respiratory Capillary Blood Glucose Pulse Action Type: Pulse: Temperature: Taken: Pressure: Rate: Glucose (mg/dl): Meter #: Oximetry (%) Taken: Pre 08:55 138/62 72 18 98 Post 11:10 124/62 72 18 98.5 Treatment Response Treatment Toleration: Well Treatment Completion Treatment Completed without Adverse Event Status: HBO Attestation I certify that I supervised this HBO treatment in accordance with Medicare guidelines. A trained emergency response team is readily Yes available per  hospital policies and procedures. Continue HBOT as ordered. Yes Electronic Signature(s) Signed: 06/04/2021 4:23:10 PM By: Kalman Shan DO Previous Signature: 06/04/2021 11:21:03 AM Version By: Enedina Finner RCP, RRT, CHT Entered By: Kalman Shan on 06/04/2021 15:46:20 Melissa [name] ([id number]) -------------------------------------------------------------------------------- HBO Safety Checklist Details Patient Name: Melissa [name] Date of Service: 06/04/2021 9:00 AM Medical Record Number: [id number] Patient Account Number: [id number] Date of Birth/Sex: May 14, 1946 (75 y.o. F) Treating RN: Cornell Barman Primary Care Hina Gupta: Miguel Aschoff Other Clinician: Jacqulyn Bath Referring Latishia Suitt: Miguel Aschoff Treating Kaleo Condrey/Extender: Yaakov Guthrie in Treatment: 2 HBO Safety Checklist Items Safety Checklist Consent Form Signed Patient voided / foley secured and emptied When did you last eato 0645 am Last dose of injectable or oral agent na Ostomy pouch emptied and vented if applicable NA All implantable devices assessed, documented and approved NA Intravenous access site secured and place NA Valuables secured Linens and cotton and cotton/polyester blend (less than 51% polyester) Personal oil-based products / skin lotions / body lotions removed Wigs or hairpieces removed NA Smoking or tobacco materials removed NA Books / newspapers / magazines / loose paper removed NA Cologne, aftershave, perfume and deodorant removed Jewelry removed (may wrap wedding band) Make-up removed NA Hair care products removed Battery operated devices (external) removed NA Heating patches and chemical warmers removed NA Titanium eyewear removed NA Nail polish cured greater than 10 hours NA Casting material cured greater than 10 hours NA Hearing aids removed NA Loose dentures or partials removed NA Prosthetics have been removed NA Patient  demonstrates correct use of air break device (if applicable) Patient concerns have been addressed Patient grounding bracelet on and cord attached to chamber Specifics for Inpatients (complete in addition to above) Medication sheet sent with patient Intravenous medications needed or due during therapy sent with patient Drainage tubes (e.g. nasogastric tube or chest  tube secured and vented) Endotracheal or Tracheotomy tube secured Cuff deflated of air and inflated with saline Airway suctioned Electronic Signature(s) Signed: 06/04/2021 11:21:03 AM By: Enedina Finner RCP, RRT, CHT Entered By: Enedina Finner on 06/04/2021 09:06:06

## 2021-06-04 NOTE — Progress Notes (Signed)
ELNORIA, LIVINGSTON ([id number]) Visit Report for 06/04/2021 Arrival Information Details Patient Name: Melissa [name], Melissa [name]. Date of Service: 06/04/2021 9:00 AM Medical Record Number: [id number] Patient Account Number: [id number] Date of Birth/Sex: 08/10/1946 (75 y.o. F) Treating RN: Cornell Barman Primary Care Abednego Yeates: Miguel Aschoff Other Clinician: Jacqulyn Bath Referring Braelyn Bordonaro: Miguel Aschoff Treating Rheanna Sergent/Extender: Yaakov Guthrie in Treatment: 2 Visit Information History Since Last Visit Added or deleted any medications: No Patient Arrived: Ambulatory Any new allergies or adverse reactions: No Arrival Time: 08:45 Had a fall or experienced change in No Accompanied By: self activities of daily living that may affect Transfer Assistance: None risk of falls: Patient Identification Verified: Yes [name] or symptoms of abuse/neglect since last visito No Secondary Verification Process Completed: Yes Hospitalized since last visit: No Patient Has Alerts: Yes Implantable device outside of the clinic excluding No Patient Alerts: NOT diabetic cellular tissue based products placed in the center since last visit: Pain Present Now: No Electronic Signature(s) Signed: 06/04/2021 11:21:03 AM By: Enedina Finner RCP, RRT, CHT Entered By: Enedina Finner on 06/04/2021 09:04:25 Melissa [name] ([id number]) -------------------------------------------------------------------------------- Encounter Discharge Information Details Patient Name: Melissa [name] Date of Service: 06/04/2021 9:00 AM Medical Record Number: [id number] Patient Account Number: [id number] Date of Birth/Sex: 06/30/1946 (75 y.o. F) Treating RN: Cornell Barman Primary Care Mansa Willers: Miguel Aschoff Other Clinician: Jacqulyn Bath Referring Lavelle Berland: Miguel Aschoff Treating Kailana Benninger/Extender: Yaakov Guthrie in Treatment: 2 Encounter Discharge Information Items Discharge  Condition: Stable Ambulatory Status: Ambulatory Discharge Destination: Home Transportation: Private Auto Accompanied By: self Schedule Follow-up Appointment: Yes Clinical Summary of Care: Notes Patient has an HBO treatment scheduled on 06/05/21 at 09:00 am. Electronic Signature(s) Signed: 06/04/2021 11:21:03 AM By: Enedina Finner RCP, RRT, CHT Entered By: Enedina Finner on 06/04/2021 11:20:35 Melissa [name] ([id number]) -------------------------------------------------------------------------------- Vitals Details Patient Name: Melissa [name] Date of Service: 06/04/2021 9:00 AM Medical Record Number: [id number] Patient Account Number: [id number] Date of Birth/Sex: 03-Feb-1946 (75 y.o. F) Treating RN: Cornell Barman Primary Care Hanzel Pizzo: Miguel Aschoff Other Clinician: Jacqulyn Bath Referring Aleen Marston: Miguel Aschoff Treating Karlie Aung/Extender: Yaakov Guthrie in Treatment: 2 Vital [name] Time Taken: 08:55 Temperature (F): 98.0 Height (in): 61.5 Pulse (bpm): 72 Weight (lbs): 178 Respiratory Rate (breaths/min): 18 Body Mass Index (BMI): 33.1 Blood Pressure (mmHg): 138/62 Reference Range: 80 - 120 mg / dl Electronic Signature(s) Signed: 06/04/2021 11:21:03 AM By: Enedina Finner RCP, RRT, CHT Entered By: Enedina Finner on 06/04/2021 09:04:59

## 2021-06-05 ENCOUNTER — Encounter: Payer: PPO | Admitting: Physician Assistant

## 2021-06-05 DIAGNOSIS — T700XXA Otitic barotrauma, initial encounter: Secondary | ICD-10-CM | POA: Diagnosis not present

## 2021-06-05 DIAGNOSIS — L598 Other specified disorders of the skin and subcutaneous tissue related to radiation: Secondary | ICD-10-CM | POA: Diagnosis not present

## 2021-06-05 DIAGNOSIS — H6981 Other specified disorders of Eustachian tube, right ear: Secondary | ICD-10-CM | POA: Diagnosis not present

## 2021-06-05 NOTE — Progress Notes (Addendum)
PRINCETTA, UPLINGER ([id number]) Visit Report for 06/05/2021 Physician Orders Details Patient Name: Melissa [name], Melissa [name]. Date of Service: 06/05/2021 9:00 AM Medical Record Number: [id number] Patient Account Number: [id number] Date of Birth/Sex: 22-Mar-1946 (75 y.o. 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 - (ICD10 L59.8 - Other specified disorders of the skin and subcutaneous tissue related to radiation) Electronic Signature(s) Signed: 06/05/2021 11:43:40 AM By: Worthy Keeler PA-C Entered By: Worthy Keeler on 06/05/2021 11:43:39 AMMARIE, MATSUURA ([id number]) -------------------------------------------------------------------------------- Prescription 06/05/2021 Patient Name: Melissa [name]. Provider: Jeri Cos PA-C Date of Birth: 1946/04/21 NPI#: [id number] Sex: F DEA#: [id number] Phone #: [phone] License #: Patient Address: [facility] [place], [place] [address] [address] [place], [place] [address] [phone] Allergies Talwin; tramadol; Darvon; ciprofloxacin; etodolac; pentazocine; propoxyphene; dicloxacillin Provider's Orders o Ear, Nose Throat - ICD10: L59.8 - Patient needs to be seen ASAP for consideration of tubes due to barotrauma with HBO therapy. Hand Signature: Date(s): Electronic Signature(s) Signed: 06/05/2021  11:03:25 PM By: Worthy Keeler PA-C Entered By: Worthy Keeler on 06/05/2021 11:43:40 Melissa [name] ([id number]) --------------------------------------------------------------------------------  Problem List Details Patient Name: Melissa [name] Date of Service: 06/05/2021 9:00 AM Medical Record Number: [id number] Patient Account Number: [id number] Date of Birth/Sex: 03-19-1946 (75 y.o. 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, rectosigmoid junction, and anus Facility Procedures CPT4 Code: 49179150 Description: (Facility Use Only) HBOT, full body chamber,  15min Modifier: Quantity: 4 Physician Procedures CPT4 Code: 5697948 Description: 01655 - WC PHYS HYPERBARIC OXYGEN THERAPY Modifier: Quantity: 1 CPT4 Code: Description: ICD-10 Diagnosis Description L59.8 Other specified disorders of the skin and subcutaneous tissue related to Z85.048 Personal history of other malignant neoplasm of rectum, rectosigmoid junc Modifier: radiation tion, and anus Quantity: Electronic Signature(s) Signed: 06/05/2021 11:42:12 AM By: Worthy Keeler PA-C Entered By: Worthy Keeler on 06/05/2021 11:42:12

## 2021-06-05 NOTE — Progress Notes (Signed)
Melissa [name] ([id number]) Visit Report for 06/05/2021 Arrival Information Details Patient Name: Melissa [name]. Date of Service: 06/05/2021 9:00 AM Medical Record Number: [id number] Patient Account Number: [id number] Date of Birth/Sex: [birth year] (75 y.o. F) Treating RN: Dolan Amen Primary Care Bellarae Lizer: Miguel Aschoff Other Clinician: Jacqulyn Bath Referring Jolicia Delira: Miguel Aschoff Treating Desmund Elman/Extender: Skipper Cliche in Treatment: 3 Visit Information History Since Last Visit Added or deleted any medications: No Patient Arrived: Ambulatory Any new allergies or adverse reactions: No Arrival Time: 08:50 Had a fall or experienced change in No Accompanied By: self activities of daily living that may affect Transfer Assistance: None risk of falls: Patient Identification Verified: Yes [name] or symptoms of abuse/neglect since last visito No Secondary Verification Process Completed: Yes Hospitalized since last visit: No Patient Has Alerts: Yes Implantable device outside of the clinic excluding No Patient Alerts: NOT diabetic cellular tissue based products placed in the center since last visit: Pain Present Now: No Electronic Signature(s) Signed: 06/05/2021 1:58:15 PM By: Enedina Finner RCP, RRT, CHT Entered By: Enedina Finner on 06/05/2021 09:19:28 Melissa [name] ([id number]) -------------------------------------------------------------------------------- Encounter Discharge Information Details Patient Name: Melissa [name] Date of Service: 06/05/2021 9:00 AM Medical Record Number: [id number] Patient Account Number: [id number] Date of Birth/Sex: [birth year] (75 y.o. F) Treating RN: Dolan Amen Primary Care Cleve Paolillo: Miguel Aschoff Other Clinician: Jacqulyn Bath Referring Liam Bossman: Miguel Aschoff Treating Deaisa Merida/Extender: Skipper Cliche in Treatment: 3 Encounter Discharge Information Items Discharge  Condition: Stable Ambulatory Status: Ambulatory Discharge Destination: Home Transportation: Private Auto Accompanied By: self Schedule Follow-up Appointment: Yes Clinical Summary of Care: Notes Patient has an HBO treatment scheduled on 06/06/21 at 09:00 am. Electronic Signature(s) Signed: 06/05/2021 1:58:15 PM By: Enedina Finner RCP, RRT, CHT Entered By: Enedina Finner on 06/05/2021 13:14:42 Melissa [name] ([id number]) -------------------------------------------------------------------------------- Vitals Details Patient Name: Melissa [name] Date of Service: 06/05/2021 9:00 AM Medical Record Number: [id number] Patient Account Number: [id number] Date of Birth/Sex: January 02, 1946 (75 y.o. F) Treating RN: Dolan Amen Primary Care Branden Shallenberger: Miguel Aschoff Other Clinician: Jacqulyn Bath Referring Naima Veldhuizen: Miguel Aschoff Treating Traves Majchrzak/Extender: Jeri Cos Weeks in Treatment: 3 Vital [name] Time Taken: 09:00 Temperature (F): 98.4 Height (in): 61.5 Pulse (bpm): 72 Weight (lbs): 178 Respiratory Rate (breaths/min): 18 Body Mass Index (BMI): 33.1 Blood Pressure (mmHg): 130/62 Reference Range: 80 - 120 mg / dl Electronic Signature(s) Signed: 06/05/2021 1:58:15 PM By: Enedina Finner RCP, RRT, CHT Entered By: Enedina Finner on 06/05/2021 09:19:54

## 2021-06-06 ENCOUNTER — Encounter: Payer: PPO | Attending: Physician Assistant | Admitting: Physician Assistant

## 2021-06-06 ENCOUNTER — Other Ambulatory Visit: Payer: Self-pay

## 2021-06-06 DIAGNOSIS — Z85048 Personal history of other malignant neoplasm of rectum, rectosigmoid junction, and anus: Secondary | ICD-10-CM | POA: Insufficient documentation

## 2021-06-06 DIAGNOSIS — Y842 Radiological procedure and radiotherapy as the cause of abnormal reaction of the patient, or of later complication, without mention of misadventure at the time of the procedure: Secondary | ICD-10-CM | POA: Diagnosis not present

## 2021-06-06 DIAGNOSIS — L598 Other specified disorders of the skin and subcutaneous tissue related to radiation: Secondary | ICD-10-CM | POA: Diagnosis not present

## 2021-06-06 NOTE — Progress Notes (Signed)
Melissa [name], Melissa [name] ([id number]) Visit Report for 06/06/2021 HBO Details Patient Name: Melissa [name], Melissa [name]. Date of Service: 06/06/2021 9:00 AM Medical Record Number: [id number] Patient Account Number: [id number] Date of Birth/Sex: Jul 11, 1946 (75 y.o. 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 HBO Treatment Details Treatment Number: 5 Patient Type: Outpatient Chamber Type: Monoplace Chamber Serial #: [id number] Treatment Protocol: 2.0 ATA with 90 minutes oxygen, with two 5 minute air breaks Treatment Details Compression Rate Down: 1.5 psi / minute De-Compression Rate Up: Compress Tx Pressure Air breaks and breathing periods Decompress Decompress Begins Reached (leave unused spaces blank) Begins Ends Chamber Pressure (ATA) 1 2 2 2 2 2  --2 1 Clock Time (24 hr) 09:01 09:16 09:46 09:52 10:22 10:27 - - 10:57 11:12 Treatment Length: 131 (minutes) Treatment Segments: 4 Vital [name] Capillary Blood Glucose Reference Range: 80 - 120 mg / dl HBO Diabetic Blood Glucose Intervention Range: <131 mg/dl or >249 mg/dl Time Vitals Blood Respiratory Capillary Blood Glucose Pulse Action Type: Pulse: Temperature: Taken: Pressure: Rate: Glucose (mg/dl): Meter #: Oximetry (%) Taken: Pre 09:01 130/62 72 18 98.3 Post 11:15 130/66 72 18 98.4 Treatment Response Treatment Toleration: Well Treatment Completion Treatment Completed without Adverse Event Status: Electronic Signature(s) Signed: 06/06/2021 11:39:50 AM By: Enedina Finner RCP, RRT, CHT Signed: 06/06/2021 4:09:56 PM By: Worthy Keeler PA-C Entered By: Stark Jock, Amado Nash on  06/06/2021 11:38:42 Melissa [name] ([id number]) -------------------------------------------------------------------------------- HBO Safety Checklist Details Patient Name: Melissa [name]. Date of Service: 06/06/2021 9:00 AM Medical Record Number: [id number] Patient Account Number: [id number] Date of Birth/Sex: September 15, 1946 (75 y.o. F) Treating RN: Carlene Coria Primary Care Ryna Beckstrom: Miguel Aschoff Other Clinician: Jacqulyn Bath Referring Rogers Ditter: Miguel Aschoff Treating Jalessa Peyser/Extender: Skipper Cliche in Treatment: 3 HBO Safety Checklist Items Safety Checklist Consent Form Signed Patient voided / foley secured and emptied When did you last eato 07:00 am Last dose of injectable or oral agent na Ostomy pouch emptied and vented if applicable NA All implantable devices assessed, documented and approved NA Intravenous access site secured and place NA Valuables secured Linens and cotton and cotton/polyester blend (less than 51% polyester) Personal oil-based products / skin lotions / body lotions removed Wigs or hairpieces removed NA Smoking or tobacco materials removed NA Books / newspapers / magazines / loose paper removed NA Cologne, aftershave, perfume and deodorant removed Jewelry removed (may wrap wedding band) Make-up removed Hair care products removed Battery operated devices (external) removed NA Heating patches and chemical warmers removed NA Titanium eyewear removed NA Nail polish cured greater than 10 hours NA Casting material cured greater than 10 hours NA Hearing aids removed NA Loose dentures or partials removed NA Prosthetics have been removed NA Patient demonstrates correct use of air break device (if applicable) Patient concerns have been addressed Patient grounding bracelet on and cord attached to chamber Specifics for Inpatients (complete in addition to above) Medication sheet sent with patient Intravenous medications needed or  due during therapy sent with patient Drainage tubes (e.g. nasogastric tube or chest tube secured and vented) Endotracheal or Tracheotomy tube secured Cuff deflated of air and inflated with saline Airway suctioned Electronic Signature(s) Signed: 06/06/2021 11:39:50 AM By: Enedina Finner RCP, RRT, CHT Entered By: Enedina Finner on 06/06/2021  09:04:46 

## 2021-06-06 NOTE — Progress Notes (Signed)
DONYEL, CASTAGNOLA ([id number]) Visit Report for 06/05/2021 HBO Details Patient Name: GABRIELLIA, REMPEL. Date of Service: 06/05/2021 9:00 AM Medical Record Number: [id number] Patient Account Number: [id number] Date of Birth/Sex: 02/01/1946 (75 y.o. 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 HBO Treatment Details Treatment Number: 4 Patient Type: Outpatient Chamber Type: Monoplace Chamber Serial #: [id number] Treatment Protocol: 2.0 ATA with 90 minutes oxygen, with two 5 minute air breaks Treatment Details Compression Rate Down: 1.5 psi / minute De-Compression Rate Up: 2.0 psi / minute Compress Tx Pressure Air breaks and breathing periods Decompress Decompress Begins Reached (leave unused spaces blank) Begins Ends Chamber Pressure (ATA) 1 2 2 2 2 2  --2 1 Clock Time (24 hr) 09:17 09:32 10:03 10:08 10:38 10:44 - - 11:14 11:25 Treatment Length: 128 (minutes) Treatment Segments: 4 Vital Signs Capillary Blood Glucose Reference Range: 80 - 120 mg / dl HBO Diabetic Blood Glucose Intervention Range: <131 mg/dl or >249 mg/dl Time Vitals Blood Respiratory Capillary Blood Glucose Pulse Action Type: Pulse: Temperature: Taken: Pressure: Rate: Glucose (mg/dl): Meter #: Oximetry (%) Taken: Pre 09:00 130/62 72 18 98.4 Post 11:30 122/62 72 18 98.6 Pre-Treatment Ear Evaluation Left Right Clear: Yes Clear: Yes Intact: Yes Intact: Yes Color: grey Color: grey PE Tubes inserted: No PE Tubes inserted: No Irrigated: No Irrigated: No Myringotomy performed: No Myringotomy performed: No Left Teed Scale: Grade 0 Right Teed Scale:  Grade 0 Treatment Response Treatment Toleration: Fair Adverse Events: 1:Barotrauma - Ear Treatment Completion Treatment Completed with Adverse Event Status: Treatment Notes Patient is scheduled for possible PE tube placement at [facility] today (06/05/21). Stanley Lyness Notes JAZARAH, CAPILI ([id number]) Unfortunately I did have a look at the patient's ears today before and after she went into HBO. The difference between the pre and post was quite significant as far as the fatigue great is concerned. Going in she was a grade 0. Coming out unfortunately she was somewhat injected and did show signs of fluid buildup behind the tympanic membrane as well. Subsequently this was probably at least a grade 1 I do not think it is any worse right now but I do believe she is going need to be seen by ENT before going back into the chamber. Working to see about going ahead and make that referral for her today. Post Treatment Teed Score Post Treatment Teed Score: Left Ear Grade 0 Post Treatment Teed Score: Right Ear Grade I HBO Attestation I certify that I supervised this HBO treatment in accordance with Medicare guidelines. A trained emergency response team is readily Yes available per hospital policies and procedures. Continue HBOT as ordered. Yes Electronic Signature(s) Signed: 06/05/2021 1:58:15 PM By: Zackery Barefoot, RRT, CHT Signed: 06/05/2021 11:03:25 PM By: Worthy Keeler PA-C Previous Signature: 06/05/2021 11:41:53 AM Version By: Worthy Keeler PA-C Entered By: Enedina Finner on 06/05/2021 13:13:52 [name] ([id number]) -------------------------------------------------------------------------------- HBO Safety Checklist Details Patient Name: [name] Date of Service: 06/05/2021 9:00 AM Medical Record Number: [id number] Patient Account Number: [id number] Date of Birth/Sex: [birth year] (75 y.o. F) Treating RN: Dolan Amen Primary Care  Tanysha Quant: Miguel Aschoff Other Clinician: Jacqulyn Bath Referring Ori Trejos: Miguel Aschoff Treating Jawaan Adachi/Extender: Jeri Cos Weeks in Treatment: 3 HBO Safety  Checklist Items Safety Checklist Consent Form Signed Patient voided / foley secured and emptied When did you last eato 07:00 am Last dose of injectable or oral agent na Ostomy pouch emptied and vented if applicable NA All implantable devices assessed, documented and approved NA Intravenous access site secured and place NA Valuables secured Linens and cotton and cotton/polyester blend (less than 51% polyester) Personal oil-based products / skin lotions / body lotions removed Wigs or hairpieces removed NA Smoking or tobacco materials removed NA Books / newspapers / magazines / loose paper removed NA Cologne, aftershave, perfume and deodorant removed Jewelry removed (may wrap wedding band) Make-up removed Hair care products removed Battery operated devices (external) removed NA Heating patches and chemical warmers removed NA Titanium eyewear removed NA Nail polish cured greater than 10 hours NA Casting material cured greater than 10 hours NA Hearing aids removed NA Loose dentures or partials removed NA Prosthetics have been removed NA Patient demonstrates correct use of air break device (if applicable) Patient concerns have been addressed Patient grounding bracelet on and cord attached to chamber Specifics for Inpatients (complete in addition to above) Medication sheet sent with patient Intravenous medications needed or due during therapy sent with patient Drainage tubes (e.g. nasogastric tube or chest tube secured and vented) Endotracheal or Tracheotomy tube secured Cuff deflated of air and inflated with saline Airway suctioned Electronic Signature(s) Signed: 06/05/2021 1:58:15 PM By: Enedina Finner RCP, RRT, CHT Entered By: Enedina Finner on 06/05/2021 09:21:21

## 2021-06-06 NOTE — Progress Notes (Signed)
[place]STON, SURGES ([id number]) Visit Report for 06/06/2021 Arrival Information Details Patient Name: Melissa [name], Melissa [name]. Date of Service: 06/06/2021 9:00 AM Medical Record Number: [id number] Patient Account Number: [id number] Date of Birth/Sex: 02-04-1946 (75 y.o. F) Treating RN: Carlene Coria Primary Care Mikaila Grunert: Miguel Aschoff Other Clinician: Jacqulyn Bath Referring Koleton Duchemin: Miguel Aschoff Treating Usha Slager/Extender: Skipper Cliche in Treatment: 3 Visit Information History Since Last Visit Added or deleted any medications: No Patient Arrived: Ambulatory Any new allergies or adverse reactions: No Arrival Time: 09:01 Had a fall or experienced change in No Accompanied By: self activities of daily living that may affect Transfer Assistance: None risk of falls: Patient Identification Verified: Yes [name] or symptoms of abuse/neglect since last visito No Secondary Verification Process Completed: Yes Hospitalized since last visit: No Patient Has Alerts: Yes Pain Present Now: No Patient Alerts: NOT diabetic Electronic Signature(s) Signed: 06/06/2021 11:39:50 AM By: Enedina Finner RCP, RRT, CHT Entered By: Enedina Finner on 06/06/2021 09:02:31 Melissa [name] ([id number]) -------------------------------------------------------------------------------- Encounter Discharge Information Details Patient Name: Melissa [name] Date of Service: 06/06/2021 9:00 AM Medical Record Number: [id number] Patient Account Number: [id number] Date of Birth/Sex: 07/09/1946 (75 y.o. F) Treating RN: Carlene Coria Primary Care Sebert Stollings: Miguel Aschoff Other Clinician: Jacqulyn Bath Referring Verba Ainley: Miguel Aschoff Treating Desiray Orchard/Extender: Skipper Cliche in Treatment: 3 Encounter Discharge Information Items Discharge Condition: Stable Ambulatory Status: Ambulatory Discharge Destination: Home Transportation: Other Accompanied By: self Schedule Follow-up  Appointment: Yes Clinical Summary of Care: Notes Patient has an HBO treatment scheduled on 06/10/21 at 09:00 am. Electronic Signature(s) Signed: 06/06/2021 11:39:50 AM By: Enedina Finner RCP, RRT, CHT Entered By: Enedina Finner on 06/06/2021 11:39:29 Melissa [name] ([id number]) -------------------------------------------------------------------------------- Vitals Details Patient Name: Melissa [name] Date of Service: 06/06/2021 9:00 AM Medical Record Number: [id number] Patient Account Number: [id number] Date of Birth/Sex: [birth year] (75 y.o. F) Treating RN: Carlene Coria Primary Care Tylique Aull: Miguel Aschoff Other Clinician: Jacqulyn Bath Referring Rucker Pridgeon: Miguel Aschoff Treating Brina Umeda/Extender: Skipper Cliche in Treatment: 3 Vital [name] Time Taken: 09:01 Temperature (F): 98.3 Height (in): 61.5 Pulse (bpm): 72 Weight (lbs): 178 Respiratory Rate (breaths/min): 18 Body Mass Index (BMI): 33.1 Blood Pressure (mmHg): 130/62 Reference Range: 80 - 120 mg / dl Electronic Signature(s) Signed: 06/06/2021 11:39:50 AM By: Enedina Finner RCP, RRT, CHT Entered By: Enedina Finner on 06/06/2021 09:03:20

## 2021-06-10 ENCOUNTER — Other Ambulatory Visit: Payer: Self-pay

## 2021-06-10 ENCOUNTER — Encounter: Payer: PPO | Admitting: Physician Assistant

## 2021-06-10 DIAGNOSIS — L598 Other specified disorders of the skin and subcutaneous tissue related to radiation: Secondary | ICD-10-CM | POA: Diagnosis not present

## 2021-06-10 NOTE — Progress Notes (Addendum)
Melissa [name] ([id number]) Visit Report for 06/10/2021 HBO Details Patient Name: Melissa [name], Melissa [name]. Date of Service: 06/10/2021 12:30 PM Medical Record Number: [id number] Patient Account Number: [id number] Date of Birth/Sex: [birth year] (75 y.o. 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 HBO Treatment Details Treatment Number: 6 Patient Type: Outpatient Chamber Type: Monoplace Chamber Serial #: [id number] Treatment Protocol: 2.0 ATA with 90 minutes oxygen, with two 5 minute air breaks Treatment Details Compression Rate Down: 1.5 psi / minute De-Compression Rate Up: 2.0 psi / minute Compress Tx Pressure Air breaks and breathing periods Decompress Decompress Begins Reached (leave unused spaces blank) Begins Ends Chamber Pressure (ATA) 1 2 2 2 2 2  - - 2 1 Clock Time (24 hr) 13:21 13:37 14:08 14:13 14:43 - - - 15:13 15:29 Treatment Length: 128 (minutes) Treatment Segments: 4 Vital [name] Capillary Blood Glucose Reference Range: 80 - 120 mg / dl HBO Diabetic Blood Glucose Intervention Range: <131 mg/dl or >249 mg/dl Time Vitals Blood Respiratory Capillary Blood Glucose Pulse Action Type: Pulse: Temperature: Taken: Pressure: Rate: Glucose (mg/dl): Meter #: Oximetry (%) Taken: Pre 13:10 118/62 72 18 98.8 Post 15:35 118/62 66 18 98.3 Treatment Response Treatment Toleration: Well Treatment Completion Treatment Completed without Adverse Event Status: Electronic Signature(s) Signed: 06/10/2021 5:21:24 PM By: Enedina Finner RCP, RRT, CHT Signed: 06/11/2021 4:45:13 PM By: Worthy Keeler PA-C Previous Signature:  06/10/2021 4:57:45 PM Version By: Worthy Keeler PA-C Previous Signature: 06/10/2021 5:05:56 PM Version By: Enedina Finner RCP, RRT, CHT Entered By: Enedina Finner on 06/10/2021 17:19:57 Melissa [name] ([id number]) -------------------------------------------------------------------------------- HBO Safety Checklist Details Patient Name: Melissa [name]. Date of Service: 06/10/2021 12:30 PM Medical Record Number: [id number] Patient Account Number: [id number] Date of Birth/Sex: 06-29-1946 (75 y.o. F) Treating RN: Dolan Amen Primary Care Pamala Hayman: Miguel Aschoff Other Clinician: Jacqulyn Bath Referring Tola Meas: Miguel Aschoff Treating Kitrina Maurin/Extender: Skipper Cliche in Treatment: 3 HBO Safety Checklist Items Safety Checklist Consent Form Signed Patient voided / foley secured and emptied When did you last eato lunch Last dose of injectable or oral agent na Ostomy pouch emptied and vented if applicable NA All implantable devices assessed, documented and approved NA Intravenous access site secured and place NA Valuables secured Linens and cotton and cotton/polyester blend (less than 51% polyester) Personal oil-based products / skin lotions / body lotions removed Wigs or hairpieces removed NA Smoking or tobacco materials removed NA Books / newspapers / magazines / loose paper removed NA Cologne, aftershave, perfume and deodorant removed Jewelry removed (may wrap wedding band) Make-up removed Hair care products removed Battery operated devices (external) removed NA Heating patches and chemical warmers removed NA Titanium eyewear removed NA Nail polish cured greater than 10 hours NA Casting material cured greater than 10 hours NA Hearing aids removed NA Loose dentures or partials removed NA Prosthetics have been removed NA Patient demonstrates correct use of air break device (if applicable) Patient concerns have been  addressed Patient grounding bracelet on and cord attached to chamber Specifics for Inpatients (complete in addition to above) Medication sheet sent with patient Intravenous medications needed or due during therapy sent with patient Drainage tubes (e.g. nasogastric tube or chest tube secured and vented) Endotracheal or Tracheotomy tube secured Cuff  deflated of air and inflated with saline Airway suctioned Electronic Signature(s) Signed: 06/10/2021 5:05:56 PM By: Enedina Finner RCP, RRT, CHT Entered By: Enedina Finner on 06/10/2021 13:25:19

## 2021-06-11 ENCOUNTER — Other Ambulatory Visit: Payer: Self-pay

## 2021-06-11 ENCOUNTER — Encounter: Payer: PPO | Admitting: Internal Medicine

## 2021-06-11 ENCOUNTER — Ambulatory Visit
Admission: RE | Admit: 2021-06-11 | Discharge: 2021-06-11 | Disposition: A | Discharge status: Home / Self Care | Payer: PPO | Source: Ambulatory Visit | Attending: Family Medicine | Admitting: Family Medicine

## 2021-06-11 DIAGNOSIS — R0989 Other specified symptoms and signs involving the circulatory and respiratory systems: Secondary | ICD-10-CM | POA: Diagnosis not present

## 2021-06-11 DIAGNOSIS — I771 Stricture of artery: Secondary | ICD-10-CM | POA: Diagnosis not present

## 2021-06-11 DIAGNOSIS — E782 Mixed hyperlipidemia: Secondary | ICD-10-CM | POA: Diagnosis not present

## 2021-06-11 DIAGNOSIS — I6523 Occlusion and stenosis of bilateral carotid arteries: Secondary | ICD-10-CM | POA: Diagnosis not present

## 2021-06-11 NOTE — Progress Notes (Signed)
HARLOWE, DOWLER ([id number]) Visit Report for 06/10/2021 Arrival Information Details Patient Name: Melissa [name], Melissa [name]. Date of Service: 06/10/2021 12:30 PM Medical Record Number: [id number] Patient Account Number: [id number] Date of Birth/Sex: Dec 12, 1945 (75 y.o. F) Treating RN: Dolan Amen Primary Care Kingjames Coury: Miguel Aschoff Other Clinician: Jacqulyn Bath Referring Lynnet Hefley: Miguel Aschoff Treating Quincey Nored/Extender: Skipper Cliche in Treatment: 3 Visit Information History Since Last Visit Added or deleted any medications: No Patient Arrived: Ambulatory Any new allergies or adverse reactions: No Arrival Time: 12:35 Had a fall or experienced change in No Accompanied By: self activities of daily living that may affect Transfer Assistance: None risk of falls: Patient Has Alerts: Yes [name] or symptoms of abuse/neglect since last visito No Patient Alerts: NOT diabetic Hospitalized since last visit: No Implantable device outside of the clinic excluding No cellular tissue based products placed in the center since last visit: Pain Present Now: No Electronic Signature(s) Signed: 06/10/2021 5:05:56 PM By: Enedina Finner RCP, RRT, CHT Entered By: Enedina Finner on 06/10/2021 13:23:45 Melissa [name] ([id number]) -------------------------------------------------------------------------------- Encounter Discharge Information Details Patient Name: Melissa [name] Date of Service: 06/10/2021 12:30 PM Medical Record Number: [id number] Patient Account Number: [id number] Date of Birth/Sex: [birth year] (75 y.o. F) Treating RN: Dolan Amen Primary Care Lakaisha Danish: Miguel Aschoff Other Clinician: Jacqulyn Bath Referring Quatavious Rossa: Miguel Aschoff Treating Kolbie Clarkston/Extender: Skipper Cliche in Treatment: 3 Encounter Discharge Information Items Discharge Condition: Stable Ambulatory Status: Ambulatory Discharge Destination: Home Transportation:  Private Auto Accompanied By: self Schedule Follow-up Appointment: Yes Clinical Summary of Care: Notes Patient has an HBO appointment scheduled on 06/12/21 at 12:30 pm. Electronic Signature(s) Signed: 06/10/2021 5:21:24 PM By: Enedina Finner RCP, RRT, CHT Entered By: Enedina Finner on 06/10/2021 17:20:48 Melissa [name] ([id number]) -------------------------------------------------------------------------------- Vitals Details Patient Name: Melissa [name] Date of Service: 06/10/2021 12:30 PM Medical Record Number: [id number] Patient Account Number: [id number] Date of Birth/Sex: Nov 20, 1946 (75 y.o. F) Treating RN: Dolan Amen Primary Care Ravis Herne: Miguel Aschoff Other Clinician: Jacqulyn Bath Referring Yareli Carthen: Miguel Aschoff Treating Shireen Rayburn/Extender: Skipper Cliche in Treatment: 3 Vital [name] Time Taken: 13:10 Temperature (F): 98.8 Height (in): 61.5 Pulse (bpm): 72 Weight (lbs): 178 Respiratory Rate (breaths/min): 18 Body Mass Index (BMI): 33.1 Blood Pressure (mmHg): 118/62 Reference Range: 80 - 120 mg / dl Electronic Signature(s) Signed: 06/10/2021 5:05:56 PM By: Enedina Finner RCP, RRT, CHT Entered By: Enedina Finner on 06/10/2021 13:24:11

## 2021-06-12 ENCOUNTER — Encounter: Payer: PPO | Admitting: Physician Assistant

## 2021-06-12 DIAGNOSIS — L598 Other specified disorders of the skin and subcutaneous tissue related to radiation: Secondary | ICD-10-CM | POA: Diagnosis not present

## 2021-06-12 NOTE — Progress Notes (Addendum)
Melissa [name], Melissa [name] ([id number]) Visit Report for 06/12/2021 Arrival Information Details Patient Name: Melissa [name], Melissa [name]. Date of Service: 06/12/2021 8:00 AM Medical Record Number: [id number] Patient Account Number: [id number] Date of Birth/Sex: 1946/09/01 (75 y.o. F) Treating RN: Dolan Amen Primary Care Iana Buzan: Miguel Aschoff Other Clinician: Jacqulyn Bath Referring Arrick Dutton: Miguel Aschoff Treating Evelina Lore/Extender: Skipper Cliche in Treatment: 4 Visit Information History Since Last Visit Added or deleted any medications: No Patient Arrived: Ambulatory Any new allergies or adverse reactions: No Arrival Time: 07:45 Had a fall or experienced change in No Accompanied By: self activities of daily living that may affect Transfer Assistance: None risk of falls: Patient Identification Verified: Yes [name] or symptoms of abuse/neglect since last visito No Secondary Verification Process Completed: Yes Hospitalized since last visit: No Patient Has Alerts: Yes Implantable device outside of the clinic excluding No Patient Alerts: NOT diabetic cellular tissue based products placed in the center since last visit: Pain Present Now: No Electronic Signature(s) Signed: 06/12/2021 4:25:17 PM By: Enedina Finner RCP, RRT, CHT Entered By: Enedina Finner on 06/12/2021 08:18:56 Melissa [name] ([id number]) -------------------------------------------------------------------------------- Encounter Discharge Information Details Patient Name: Melissa [name] Date of Service: 06/12/2021 8:00 AM Medical Record Number: [id number] Patient Account Number: [id number] Date of Birth/Sex: 1946-02-09 (75 y.o. F) Treating RN: Dolan Amen Primary Care Orlo Brickle: Miguel Aschoff Other Clinician: Jacqulyn Bath Referring Kamera Dubas: Miguel Aschoff Treating Kyran Whittier/Extender: Skipper Cliche in Treatment: 4 Encounter Discharge Information Items Discharge Condition:  Stable Ambulatory Status: Ambulatory Discharge Destination: Home Transportation: Private Auto Accompanied By: self Schedule Follow-up Appointment: Yes Clinical Summary of Care: Notes Patient has an HBO treatment scheduled on 06/13/21 at 08:30 am. Electronic Signature(s) Signed: 06/13/2021 11:07:36 AM By: Enedina Finner RCP, RRT, CHT Entered By: Enedina Finner on 06/12/2021 17:24:07 Melissa [name] ([id number]) -------------------------------------------------------------------------------- Vitals Details Patient Name: Melissa [name] Date of Service: 06/12/2021 8:00 AM Medical Record Number: [id number] Patient Account Number: [id number] Date of Birth/Sex: [birth year] (75 y.o. F) Treating RN: Dolan Amen Primary Care Matilde Markie: Miguel Aschoff Other Clinician: Jacqulyn Bath Referring Kelani Robart: Miguel Aschoff Treating Fayelynn Distel/Extender: Skipper Cliche in Treatment: 4 Vital [name] Time Taken: 08:05 Temperature (F): 98.3 Height (in): 61.5 Pulse (bpm): 66 Weight (lbs): 178 Respiratory Rate (breaths/min): 18 Body Mass Index (BMI): 33.1 Blood Pressure (mmHg): 118/60 Reference Range: 80 - 120 mg / dl Electronic Signature(s) Signed: 06/12/2021 4:25:17 PM By: Enedina Finner RCP, RRT, CHT Entered By: Enedina Finner on 06/12/2021 54:27:06

## 2021-06-12 NOTE — Progress Notes (Addendum)
Melissa [name], Melissa [name] ([id number]) Visit Report for 06/12/2021 HBO Details Patient Name: Melissa [name], Melissa [name]. Date of Service: 06/12/2021 8:00 AM Medical Record Number: [id number] Patient Account Number: [id number] Date of Birth/Sex: [birth year] (75 y.o. 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 HBO Treatment Details Treatment Number: 7 Patient Type: Outpatient Chamber Type: Monoplace Chamber Serial #: [id number] Treatment Protocol: 2.0 ATA with 90 minutes oxygen, with two 5 minute air breaks Treatment Details Compression Rate Down: 1.5 psi / minute De-Compression Rate Up: 2.0 psi / minute Compress Tx Pressure Air breaks and breathing periods Decompress Decompress Begins Reached (leave unused spaces blank) Begins Ends Chamber Pressure (ATA) 1 2 2 2 2 2  --2 1 Clock Time (24 hr) 08:18 08:33 09:03 09:08 09:40 09:45 - - 10:15 10:29 Treatment Length: 131 (minutes) Treatment Segments: 4 Vital [name] Capillary Blood Glucose Reference Range: 80 - 120 mg / dl HBO Diabetic Blood Glucose Intervention Range: <131 mg/dl or >249 mg/dl Time Vitals Blood Respiratory Capillary Blood Glucose Pulse Action Type: Pulse: Temperature: Taken: Pressure: Rate: Glucose (mg/dl): Meter #: Oximetry (%) Taken: Pre 08:05 118/60 66 18 98.3 Post 10:33 130/64 66 18 98.5 Treatment Response Treatment Toleration: Well Treatment Completion Treatment Completed without Adverse Event Status: Electronic Signature(s) Signed: 06/13/2021 11:07:36 AM By: Melissa [name] RCP, RRT, CHT Signed: 06/13/2021 11:51:42 AM By: Melissa Keeler PA-C Previous Signature:  06/12/2021 4:25:17 PM Version By: Melissa [name] RCP, RRT, CHT Previous Signature: 06/12/2021 5:08:48 PM Version By: Melissa Keeler PA-C Entered By: Melissa [name], Melissa [name] on 06/12/2021 17:23:15 Melissa [name] ([id number]) -------------------------------------------------------------------------------- HBO Safety Checklist Details Patient Name: Melissa [name]. Date of Service: 06/12/2021 8:00 AM Medical Record Number: [id number] Patient Account Number: [id number] Date of Birth/Sex: [birth year] (75 y.o. F) Treating RN: Melissa [name] Primary Care Melissa [name]: Melissa [name] Other Clinician: Jacqulyn [name] Referring Melissa [name]: Melissa [name] Treating Melissa [name]/Extender: Melissa [name] in Treatment: 4 HBO Safety Checklist Items Safety Checklist Consent Form Signed Patient voided / foley secured and emptied When did you last eato 07:00 am Last dose of injectable or oral agent na Ostomy pouch emptied and vented if applicable NA All implantable devices assessed, documented and approved NA Intravenous access site secured and place NA Valuables secured Linens and cotton and cotton/polyester blend (less than 51% polyester) Personal oil-based products / skin lotions / body lotions removed Wigs or hairpieces removed NA Smoking or tobacco materials removed NA Books / newspapers / magazines / loose paper removed NA Cologne, aftershave, perfume and deodorant removed Jewelry removed (may wrap wedding band) Make-up removed Hair care products removed Battery operated devices (external) removed NA Heating patches and chemical warmers removed NA Titanium eyewear removed NA Nail polish cured greater than 10 hours NA Casting material cured greater than 10 hours NA Hearing aids removed NA Loose dentures or partials removed NA Prosthetics have been removed NA Patient demonstrates correct use of air break device (if applicable) Patient concerns have been  addressed Patient grounding bracelet on and cord attached to chamber Specifics for Inpatients (complete in addition to above) Medication sheet sent with patient Intravenous medications needed or due during therapy sent with patient Drainage tubes (e.g. nasogastric tube or chest tube secured and vented) Endotracheal or Tracheotomy tube secured Cuff deflated  of air and inflated with saline Airway suctioned Electronic Signature(s) Signed: 06/12/2021 4:25:17 PM By: Melissa [name] RCP, RRT, CHT Entered By: Melissa [name], Melissa [name] on 06/12/2021 82:95:62

## 2021-06-13 ENCOUNTER — Encounter: Payer: PPO | Admitting: Physician Assistant

## 2021-06-13 ENCOUNTER — Other Ambulatory Visit: Payer: Self-pay

## 2021-06-13 DIAGNOSIS — L598 Other specified disorders of the skin and subcutaneous tissue related to radiation: Secondary | ICD-10-CM | POA: Diagnosis not present

## 2021-06-13 NOTE — Progress Notes (Signed)
ZACARIA, POUSSON ([id number]) Visit Report for 06/13/2021 HBO Details Patient Name: Melissa [name], Melissa [name]. Date of Service: 06/13/2021 9:00 AM Medical Record Number: [id number] Patient Account Number: [id number] Date of Birth/Sex: [birth year] (75 y.o. 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 HBO Treatment Details Treatment Number: 8 Patient Type: Outpatient Chamber Type: Monoplace Chamber Serial #: [id number] Treatment Protocol: 2.0 ATA with 90 minutes oxygen, with two 5 minute air breaks Treatment Details Compression Rate Down: 1.5 psi / minute De-Compression Rate Up: 2.0 psi / minute Compress Tx Pressure Air breaks and breathing periods Decompress Decompress Begins Reached (leave unused spaces blank) Begins Ends Chamber Pressure (ATA) 1 2 2 2 2 2  --2 1 Clock Time (24 hr) 08:38 08:53 09:23 09:28 09:59 10:04 - - 10:34 10:46 Treatment Length: 128 (minutes) Treatment Segments: 4 Vital [name] Capillary Blood Glucose Reference Range: 80 - 120 mg / dl HBO Diabetic Blood Glucose Intervention Range: <131 mg/dl or >249 mg/dl Time Vitals Blood Respiratory Capillary Blood Glucose Pulse Action Type: Pulse: Temperature: Taken: Pressure: Rate: Glucose (mg/dl): Meter #: Oximetry (%) Taken: Pre 08:30 122/62 72 18 98.5 Post 10:49 110/62 66 18 98.2 Treatment Response Treatment Toleration: Well Treatment Completion Treatment Completed without Adverse Event Status: Electronic Signature(s) Signed: 06/13/2021 11:07:36 AM By: Enedina Finner RCP, RRT, CHT Signed: 06/13/2021 5:03:48 PM By: Worthy Keeler PA-C Entered By: Stark Jock, Amado Nash on 06/13/2021 11:06:27 Melissa [name] ([id number]) -------------------------------------------------------------------------------- HBO Safety Checklist Details Patient Name: Melissa [name]. Date of Service: 06/13/2021 9:00 AM Medical Record Number: [id number] Patient Account Number: [id number] Date of Birth/Sex: [birth year] (75 y.o. F) Treating RN: Carlene Coria Primary Care Tonya Carlile: Miguel Aschoff Other Clinician: Jacqulyn Bath Referring Elleah Hemsley: Miguel Aschoff Treating Kaisey Huseby/Extender: Skipper Cliche in Treatment: 4 HBO Safety Checklist Items Safety Checklist Consent Form Signed Patient voided / foley secured and emptied When did you last eato 07:00 am Last dose of injectable or oral agent na Ostomy pouch emptied and vented if applicable NA All implantable devices assessed, documented and approved NA Intravenous access site secured and place NA Valuables secured Linens and cotton and cotton/polyester blend (less than 51% polyester) Personal oil-based products / skin lotions / body lotions removed Wigs or hairpieces removed NA Smoking or tobacco materials removed NA Books / newspapers / magazines / loose paper removed NA Cologne, aftershave, perfume and deodorant removed Jewelry removed (may wrap wedding band) Make-up removed Hair care products removed Battery operated devices (external) removed NA Heating patches and chemical warmers removed NA Titanium eyewear removed NA Nail polish cured greater than 10 hours NA Casting material cured greater than 10 hours NA Hearing aids removed NA Loose dentures or partials removed NA Prosthetics have been removed NA Patient demonstrates correct use of air break device (if applicable) Patient concerns have been addressed Patient grounding bracelet on and cord attached to chamber Specifics for Inpatients (complete in addition to above) Medication sheet sent with patient Intravenous  medications needed or due during therapy sent with patient Drainage tubes (e.g. nasogastric tube or chest tube secured and vented) Endotracheal or Tracheotomy tube secured Cuff deflated of air and inflated with saline Airway suctioned Electronic Signature(s) Signed: 06/13/2021 11:07:36 AM By: Enedina Finner RCP, RRT, CHT Entered By: Juleen China,  Durene Fruits on 06/13/2021 08:42:11

## 2021-06-13 NOTE — Progress Notes (Signed)
ELLER, SWEIS ([id number]) Visit Report for 06/13/2021 Arrival Information Details Patient Name: Melissa [name], Melissa [name]. Date of Service: 06/13/2021 9:00 AM Medical Record Number: [id number] Patient Account Number: [id number] Date of Birth/Sex: [birth year] (75 y.o. F) Treating RN: Carlene Coria Primary Care Ryllie Nieland: Miguel Aschoff Other Clinician: Jacqulyn Bath Referring Tynan Boesel: Miguel Aschoff Treating Dorell Gatlin/Extender: Skipper Cliche in Treatment: 4 Visit Information History Since Last Visit Added or deleted any medications: No Patient Arrived: Ambulatory Any new allergies or adverse reactions: No Arrival Time: 08:10 Had a fall or experienced change in No Accompanied By: self activities of daily living that may affect Transfer Assistance: None risk of falls: Patient Identification Verified: Yes [name] or symptoms of abuse/neglect since last visito No Secondary Verification Process Completed: Yes Hospitalized since last visit: No Patient Has Alerts: Yes Implantable device outside of the clinic excluding No Patient Alerts: NOT diabetic cellular tissue based products placed in the center since last visit: Pain Present Now: No Electronic Signature(s) Signed: 06/13/2021 11:07:36 AM By: Enedina Finner RCP, RRT, CHT Entered By: Enedina Finner on 06/13/2021 08:39:48 Melissa [name] ([id number]) -------------------------------------------------------------------------------- Encounter Discharge Information Details Patient Name: Melissa [name] Date of Service: 06/13/2021 9:00 AM Medical Record Number: [id number] Patient Account Number: [id number] Date of Birth/Sex: [birth year] (75 y.o. F) Treating RN: Carlene Coria Primary Care Aariyah Sampey: Miguel Aschoff Other Clinician: Jacqulyn Bath Referring Granvil Djordjevic: Miguel Aschoff Treating Brenetta Penny/Extender: Skipper Cliche in Treatment: 4 Encounter Discharge Information Items Discharge Condition:  Stable Ambulatory Status: Ambulatory Discharge Destination: Home Transportation: Private Auto Accompanied By: self Schedule Follow-up Appointment: Yes Clinical Summary of Care: Notes Patient has an HBO treatment scheduled on 06/16/21 at 09:00 am. Electronic Signature(s) Signed: 06/13/2021 11:07:36 AM By: Enedina Finner RCP, RRT, CHT Entered By: Enedina Finner on 06/13/2021 11:07:07 Melissa [name] ([id number]) -------------------------------------------------------------------------------- Vitals Details Patient Name: Melissa [name] Date of Service: 06/13/2021 9:00 AM Medical Record Number: [id number] Patient Account Number: [id number] Date of Birth/Sex: 1946-02-03 (75 y.o. F) Treating RN: Carlene Coria Primary Care Jamy Cleckler: Miguel Aschoff Other Clinician: Jacqulyn Bath Referring Arieliz Latino: Miguel Aschoff Treating Lin Hackmann/Extender: Skipper Cliche in Treatment: 4 Vital [name] Time Taken: 08:30 Temperature (F): 98.5 Height (in): 61.5 Pulse (bpm): 72 Weight (lbs): 178 Respiratory Rate (breaths/min): 18 Body Mass Index (BMI): 33.1 Blood Pressure (mmHg): 122/62 Reference Range: 80 - 120 mg / dl Electronic Signature(s) Signed: 06/13/2021 11:07:36 AM By: Enedina Finner RCP, RRT, CHT Entered By: Enedina Finner on 06/13/2021 08:41:13

## 2021-06-16 ENCOUNTER — Other Ambulatory Visit: Payer: Self-pay

## 2021-06-16 ENCOUNTER — Other Ambulatory Visit: Payer: Self-pay | Admitting: *Deleted

## 2021-06-16 ENCOUNTER — Encounter: Payer: PPO | Admitting: Physician Assistant

## 2021-06-16 DIAGNOSIS — L598 Other specified disorders of the skin and subcutaneous tissue related to radiation: Secondary | ICD-10-CM | POA: Diagnosis not present

## 2021-06-16 DIAGNOSIS — R9389 Abnormal findings on diagnostic imaging of other specified body structures: Secondary | ICD-10-CM

## 2021-06-16 NOTE — Progress Notes (Signed)
RAPHAEL, ESPE ([id number]) Visit Report for 06/16/2021 Arrival Information Details Patient Name: Melissa [name], Melissa [name]. Date of Service: 06/16/2021 9:00 AM Medical Record Number: [id number] Patient Account Number: [id number] Date of Birth/Sex: 1946-11-27 (75 y.o. F) Treating RN: Donnamarie Poag Primary Care Chasta Deshpande: Miguel Aschoff Other Clinician: Jacqulyn Bath Referring Lanetta Figuero: Miguel Aschoff Treating Roxas Clymer/Extender: Skipper Cliche in Treatment: 4 Visit Information History Since Last Visit Added or deleted any medications: No Patient Arrived: Ambulatory Any new allergies or adverse reactions: No Arrival Time: 08:45 Had a fall or experienced change in No Accompanied By: self activities of daily living that may affect Transfer Assistance: None risk of falls: Patient Identification Verified: Yes [name] or symptoms of abuse/neglect since last visito No Secondary Verification Process Completed: Yes Hospitalized since last visit: No Patient Has Alerts: Yes Implantable device outside of the clinic excluding No Patient Alerts: NOT diabetic cellular tissue based products placed in the center since last visit: Pain Present Now: No Electronic Signature(s) Signed: 06/16/2021 11:35:41 AM By: Enedina Finner RCP, RRT, CHT Entered By: Stark Jock, Amado Nash on 06/16/2021 09:17:48 Melissa [name] ([id number]) -------------------------------------------------------------------------------- Encounter Discharge Information Details Patient Name: Melissa [name] Date of Service: 06/16/2021 9:00 AM Medical Record Number: [id number] Patient Account Number: [id number] Date of Birth/Sex: 1945-12-10 (75 y.o. F) Treating RN: Donnamarie Poag Primary Care Fajr Fife: Miguel Aschoff Other Clinician: Jacqulyn Bath Referring Nicloe Frontera: Miguel Aschoff Treating Guinn Delarosa/Extender: Skipper Cliche in Treatment: 4 Encounter Discharge Information Items Discharge Condition:  Stable Ambulatory Status: Ambulatory Discharge Destination: Home Transportation: Private Auto Accompanied By: self Schedule Follow-up Appointment: Yes Clinical Summary of Care: Notes Patient has an HBO treatment scheduled on 06/17/21 at 09:00 am. Electronic Signature(s) Signed: 06/16/2021 11:35:41 AM By: Enedina Finner RCP, RRT, CHT Entered By: Enedina Finner on 06/16/2021 11:35:23 Melissa [name] ([id number]) -------------------------------------------------------------------------------- Vitals Details Patient Name: Melissa [name] Date of Service: 06/16/2021 9:00 AM Medical Record Number: [id number] Patient Account Number: [id number] Date of Birth/Sex: Feb 19, 1946 (75 y.o. F) Treating RN: Donnamarie Poag Primary Care Roschelle Calandra: Miguel Aschoff Other Clinician: Jacqulyn Bath Referring Lister Brizzi: Miguel Aschoff Treating Biridiana Twardowski/Extender: Skipper Cliche in Treatment: 4 Vital [name] Time Taken: 08:55 Temperature (F): 98.7 Height (in): 61.5 Pulse (bpm): 66 Weight (lbs): 178 Respiratory Rate (breaths/min): 18 Body Mass Index (BMI): 33.1 Blood Pressure (mmHg): 130/64 Reference Range: 80 - 120 mg / dl Electronic Signature(s) Signed: 06/16/2021 11:35:41 AM By: Enedina Finner RCP, RRT, CHT Entered By: Enedina Finner on 06/16/2021 09:18:15

## 2021-06-16 NOTE — Progress Notes (Signed)
Melissa [name], Melissa [name] ([id number]) Visit Report for 06/16/2021 HBO Details Patient Name: Melissa [name], Melissa [name]. Date of Service: 06/16/2021 9:00 AM Medical Record Number: [id number] Patient Account Number: [id number] Date of Birth/Sex: 1946/08/28 (75 y.o. 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 HBO Treatment Details Treatment Number: 9 Patient Type: Outpatient Chamber Type: Monoplace Chamber Serial #: [id number] Treatment Protocol: 2.0 ATA with 90 minutes oxygen, with two 5 minute air breaks Treatment Details Compression Rate Down: 1.5 psi / minute De-Compression Rate Up: 2.0 psi / minute Compress Tx Pressure Air breaks and breathing periods Decompress Decompress Begins Reached (leave unused spaces blank) Begins Ends Chamber Pressure (ATA) 1 2 2 2 2 2  --2 1 Clock Time (24 hr) 09:09 09:24 09:54 10:00 10:30 10:35 - - 11:05 11:17 Treatment Length: 128 (minutes) Treatment Segments: 4 Vital [name] Capillary Blood Glucose Reference Range: 80 - 120 mg / dl HBO Diabetic Blood Glucose Intervention Range: <131 mg/dl or >249 mg/dl Time Vitals Blood Respiratory Capillary Blood Glucose Pulse Action Type: Pulse: Temperature: Taken: Pressure: Rate: Glucose (mg/dl): Meter #: Oximetry (%) Taken: Pre 08:55 130/64 66 18 98.7 Post 11:20 120/62 72 18 98.3 Treatment Response Treatment Toleration: Well Treatment Completion Treatment Completed without Adverse Event Status: Electronic Signature(s) Signed: 06/16/2021 11:35:41 AM By: Enedina Finner RCP, RRT, CHT Signed: 06/16/2021 3:56:57 PM By: Worthy Keeler PA-C Entered By:  Stark Jock, Amado Nash on 06/16/2021 11:34:31 Melissa [name] ([id number]) -------------------------------------------------------------------------------- HBO Safety Checklist Details Patient Name: Melissa [name]. Date of Service: 06/16/2021 9:00 AM Medical Record Number: [id number] Patient Account Number: [id number] Date of Birth/Sex: July 28, 1946 (75 y.o. F) Treating RN: Donnamarie Poag Primary Care Marijean Montanye: Miguel Aschoff Other Clinician: Jacqulyn Bath Referring Jayra Choyce: Miguel Aschoff Treating Lipa Knauff/Extender: Skipper Cliche in Treatment: 4 HBO Safety Checklist Items Safety Checklist Consent Form Signed Patient voided / foley secured and emptied When did you last eato 07:00 am Last dose of injectable or oral agent na Ostomy pouch emptied and vented if applicable NA All implantable devices assessed, documented and approved NA Intravenous access site secured and place NA Valuables secured Linens and cotton and cotton/polyester blend (less than 51% polyester) Personal oil-based products / skin lotions / body lotions removed Wigs or hairpieces removed NA Smoking or tobacco materials removed NA Books / newspapers / magazines / loose paper removed NA Cologne, aftershave, perfume and deodorant removed Jewelry removed (may wrap wedding band) Make-up removed Hair care products removed Battery operated devices (external) removed NA Heating patches and chemical warmers removed NA Titanium eyewear removed NA Nail polish cured greater than 10 hours NA Casting material cured greater than 10 hours NA Hearing aids removed NA Loose dentures or partials removed NA Prosthetics have been removed NA Patient demonstrates correct use of air break device (if applicable) Patient concerns have been addressed Patient grounding bracelet on and cord attached to chamber Specifics for Inpatients (complete in addition to above) Medication sheet sent with  patient Intravenous medications needed or due during therapy sent with patient Drainage tubes (e.g. nasogastric tube or chest tube secured and vented) Endotracheal or Tracheotomy tube secured Cuff deflated of air and inflated with saline Airway suctioned Electronic Signature(s) Signed: 06/16/2021 11:35:41 AM By: Stark Jock, Sallie RCP, RRT, CHT Entered By: Juleen China,  Durene Fruits on 06/16/2021 09:19:20

## 2021-06-17 ENCOUNTER — Encounter: Payer: PPO | Admitting: Physician Assistant

## 2021-06-17 DIAGNOSIS — L598 Other specified disorders of the skin and subcutaneous tissue related to radiation: Secondary | ICD-10-CM | POA: Diagnosis not present

## 2021-06-17 NOTE — Progress Notes (Signed)
TAQUISHA, PHUNG ([id number]) Visit Report for 06/17/2021 Arrival Information Details Patient Name: Melissa [name], Melissa [name]. Date of Service: 06/17/2021 9:00 AM Medical Record Number: [id number] Patient Account Number: [id number] Date of Birth/Sex: 1946-10-20 (75 y.o. F) Treating RN: Dolan Amen Primary Care Tiran Sauseda: Miguel Aschoff Other Clinician: Jacqulyn Bath Referring Keiona Jenison: Miguel Aschoff Treating Juanell Saffo/Extender: Skipper Cliche in Treatment: 4 Visit Information History Since Last Visit Added or deleted any medications: No Patient Arrived: Ambulatory Any new allergies or adverse reactions: No Arrival Time: 08:50 Had a fall or experienced change in No Accompanied By: self activities of daily living that may affect Transfer Assistance: None risk of falls: Patient Identification Verified: Yes [name] or symptoms of abuse/neglect since last visito No Secondary Verification Process Completed: Yes Hospitalized since last visit: No Patient Has Alerts: Yes Implantable device outside of the clinic excluding No Patient Alerts: NOT diabetic cellular tissue based products placed in the center since last visit: Pain Present Now: No Electronic Signature(s) Signed: 06/17/2021 11:29:54 AM By: Enedina Finner RCP, RRT, CHT Entered By: Enedina Finner on 06/17/2021 09:18:59 Melissa [name] ([id number]) -------------------------------------------------------------------------------- Encounter Discharge Information Details Patient Name: Melissa [name] Date of Service: 06/17/2021 9:00 AM Medical Record Number: [id number] Patient Account Number: [id number] Date of Birth/Sex: Jan 26, 1946 (75 y.o. F) Treating RN: Dolan Amen Primary Care Jolean Madariaga: Miguel Aschoff Other Clinician: Jacqulyn Bath Referring Raeleigh Guinn: Miguel Aschoff Treating Amela Handley/Extender: Skipper Cliche in Treatment: 4 Encounter Discharge Information Items Discharge  Condition: Stable Ambulatory Status: Ambulatory Discharge Destination: Home Transportation: Private Auto Accompanied By: self Schedule Follow-up Appointment: Yes Clinical Summary of Care: Notes Patient has an HBO treatment scheduled on 06/18/21 at 09:00 am. Electronic Signature(s) Signed: 06/17/2021 11:29:54 AM By: Enedina Finner RCP, RRT, CHT Entered By: Enedina Finner on 06/17/2021 11:29:27 Melissa [name] ([id number]) -------------------------------------------------------------------------------- Vitals Details Patient Name: Melissa [name] Date of Service: 06/17/2021 9:00 AM Medical Record Number: [id number] Patient Account Number: [id number] Date of Birth/Sex: Apr 08, 1946 (75 y.o. F) Treating RN: Dolan Amen Primary Care Elene Downum: Miguel Aschoff Other Clinician: Jacqulyn Bath Referring Shamel Galyean: Miguel Aschoff Treating Hiliana Eilts/Extender: Skipper Cliche in Treatment: 4 Vital [name] Time Taken: 09:55 Temperature (F): 98.1 Height (in): 61.5 Pulse (bpm): 66 Weight (lbs): 178 Respiratory Rate (breaths/min): 18 Body Mass Index (BMI): 33.1 Blood Pressure (mmHg): 135/62 Reference Range: 80 - 120 mg / dl Electronic Signature(s) Signed: 06/17/2021 11:29:54 AM By: Enedina Finner RCP, RRT, CHT Entered By: Enedina Finner on 06/17/2021 09:19:32

## 2021-06-18 ENCOUNTER — Other Ambulatory Visit: Payer: Self-pay

## 2021-06-18 ENCOUNTER — Encounter (HOSPITAL_BASED_OUTPATIENT_CLINIC_OR_DEPARTMENT_OTHER): Payer: PPO | Admitting: Internal Medicine

## 2021-06-18 DIAGNOSIS — Z85048 Personal history of other malignant neoplasm of rectum, rectosigmoid junction, and anus: Secondary | ICD-10-CM

## 2021-06-18 DIAGNOSIS — L598 Other specified disorders of the skin and subcutaneous tissue related to radiation: Secondary | ICD-10-CM

## 2021-06-18 NOTE — Progress Notes (Signed)
PERRIN, EDDLEMAN ([id number]) Visit Report for 06/17/2021 HBO Details Patient Name: Melissa [name], Melissa [name]. Date of Service: 06/17/2021 9:00 AM Medical Record Number: [id number] Patient Account Number: [id number] Date of Birth/Sex: 05/14/1946 (75 y.o. 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 HBO Treatment Details Treatment Number: 10 Patient Type: Outpatient Chamber Type: Monoplace Chamber Serial #: [id number] Treatment Protocol: 2.0 ATA with 90 minutes oxygen, with two 5 minute air breaks Treatment Details Compression Rate Down: 1.5 psi / minute De-Compression Rate Up: 2.0 psi / minute Compress Tx Pressure Air breaks and breathing periods Decompress Decompress Begins Reached (leave unused spaces blank) Begins Ends Chamber Pressure (ATA) 1 2 2 2 2 2  --2 1 Clock Time (24 hr) 09:03 09:18 09:48 09:53 10:23 10:28 - - 10:58 11:13 Treatment Length: 130 (minutes) Treatment Segments: 4 Vital [name] Capillary Blood Glucose Reference Range: 80 - 120 mg / dl HBO Diabetic Blood Glucose Intervention Range: <131 mg/dl or >249 mg/dl Time Vitals Blood Respiratory Capillary Blood Glucose Pulse Action Type: Pulse: Temperature: Taken: Pressure: Rate: Glucose (mg/dl): Meter #: Oximetry (%) Taken: Pre 09:55 135/62 66 18 98.1 Post 11:25 138/60 84 18 98.8 Treatment Response Treatment Toleration: Well Treatment Completion Treatment Completed without Adverse Event Status: Electronic Signature(s) Signed: 06/17/2021 11:29:54 AM By: Enedina Finner RCP, RRT, CHT Signed: 06/17/2021 6:20:26 PM By: Worthy Keeler PA-C Entered By:  Stark Jock, Amado Nash on 06/17/2021 11:28:18 Melissa [name] ([id number]) -------------------------------------------------------------------------------- HBO Safety Checklist Details Patient Name: Melissa [name]. Date of Service: 06/17/2021 9:00 AM Medical Record Number: [id number] Patient Account Number: [id number] Date of Birth/Sex: 1946-03-20 (75 y.o. F) Treating RN: Dolan Amen Primary Care Kortnie Stovall: Miguel Aschoff Other Clinician: Jacqulyn Bath Referring Syre Knerr: Miguel Aschoff Treating Towanda Hornstein/Extender: Skipper Cliche in Treatment: 4 HBO Safety Checklist Items Safety Checklist Consent Form Signed Patient voided / foley secured and emptied When did you last eato 07:00 am Last dose of injectable or oral agent na Ostomy pouch emptied and vented if applicable NA All implantable devices assessed, documented and approved NA Intravenous access site secured and place NA Valuables secured Linens and cotton and cotton/polyester blend (less than 51% polyester) Personal oil-based products / skin lotions / body lotions removed Wigs or hairpieces removed NA Smoking or tobacco materials removed NA Books / newspapers / magazines / loose paper removed NA Cologne, aftershave, perfume and deodorant removed Jewelry removed (may wrap wedding band) Make-up removed Hair care products removed Battery operated devices (external) removed NA Heating patches and chemical warmers removed NA Titanium eyewear removed NA Nail polish cured greater than 10 hours NA Casting material cured greater than 10 hours NA Hearing aids removed NA Loose dentures or partials removed NA Prosthetics have been removed NA Patient demonstrates correct use of air break device (if applicable) Patient concerns have been addressed Patient grounding bracelet on and cord attached to chamber Specifics for Inpatients (complete in addition to above) Medication sheet sent with  patient Intravenous medications needed or due during therapy sent with patient Drainage tubes (e.g. nasogastric tube or chest tube secured and vented) Endotracheal or Tracheotomy tube secured Cuff deflated of air and inflated with saline Airway suctioned Electronic Signature(s) Signed: 06/17/2021 11:29:54 AM By: Enedina Finner RCP, RRT, CHT Entered By: Juleen China,  Durene Fruits on 06/17/2021 09:20:34

## 2021-06-18 NOTE — Progress Notes (Signed)
Melissa [name], Melissa [name] ([id number]) Visit Report for 06/18/2021 Arrival Information Details Patient Name: Melissa [name], Melissa [name]. Date of Service: 06/18/2021 9:00 AM Medical Record Number: [id number] Patient Account Number: [id number] Date of Birth/Sex: [birth year] (75 y.o. F) Treating RN: Cornell Barman Primary Care Ekansh Sherk: Miguel Aschoff Other Clinician: Jacqulyn Bath Referring Cadance Raus: Miguel Aschoff Treating Sherby Moncayo/Extender: Yaakov Guthrie in Treatment: 4 Visit Information History Since Last Visit Added or deleted any medications: No Patient Arrived: Ambulatory Any new allergies or adverse reactions: No Arrival Time: 08:40 Had a fall or experienced change in No Accompanied By: self activities of daily living that may affect Transfer Assistance: None risk of falls: Patient Identification Verified: Yes [name] or symptoms of abuse/neglect since last visito No Secondary Verification Process Completed: Yes Hospitalized since last visit: No Patient Has Alerts: Yes Implantable device outside of the clinic excluding No Patient Alerts: NOT diabetic cellular tissue based products placed in the center since last visit: Pain Present Now: No Electronic Signature(s) Signed: 06/18/2021 11:23:42 AM By: Enedina Finner RCP, RRT, CHT Entered By: Stark Jock, Amado Nash on 06/18/2021 08:57:07 Melissa [name] ([id number]) -------------------------------------------------------------------------------- Encounter Discharge Information Details Patient Name: Melissa [name] Date of Service: 06/18/2021 9:00 AM Medical Record Number: [id number] Patient Account Number: [id number] Date of Birth/Sex: [birth year] (75 y.o. F) Treating RN: Cornell Barman Primary Care Naydene Kamrowski: Miguel Aschoff Other Clinician: Jacqulyn Bath Referring Aquarius Tremper: Miguel Aschoff Treating Azarah Dacy/Extender: Yaakov Guthrie in Treatment: 4 Encounter Discharge Information Items Discharge  Condition: Stable Ambulatory Status: Ambulatory Discharge Destination: Home Transportation: Private Auto Accompanied By: self Schedule Follow-up Appointment: Yes Clinical Summary of Care: Notes Patient has an HBO treatment scheduled on 06/19/21 at 09:00 am. Electronic Signature(s) Signed: 06/18/2021 11:23:42 AM By: Enedina Finner RCP, RRT, CHT Entered By: Enedina Finner on 06/18/2021 11:23:24 Melissa [name] ([id number]) -------------------------------------------------------------------------------- Vitals Details Patient Name: Melissa [name] Date of Service: 06/18/2021 9:00 AM Medical Record Number: [id number] Patient Account Number: [id number] Date of Birth/Sex: 01/26/1946 (75 y.o. F) Treating RN: Cornell Barman Primary Care Jaydalynn Olivero: Miguel Aschoff Other Clinician: Jacqulyn Bath Referring Ameila Weldon: Miguel Aschoff Treating Ebone Alcivar/Extender: Yaakov Guthrie in Treatment: 4 Vital [name] Time Taken: 08:50 Temperature (F): 98.3 Height (in): 61.5 Pulse (bpm): 78 Weight (lbs): 178 Respiratory Rate (breaths/min): 18 Body Mass Index (BMI): 33.1 Blood Pressure (mmHg): 134/62 Reference Range: 80 - 120 mg / dl Electronic Signature(s) Signed: 06/18/2021 11:23:42 AM By: Enedina Finner RCP, RRT, CHT Entered By: Enedina Finner on 06/18/2021 08:57:57

## 2021-06-19 ENCOUNTER — Encounter: Payer: PPO | Admitting: Physician Assistant

## 2021-06-19 DIAGNOSIS — L598 Other specified disorders of the skin and subcutaneous tissue related to radiation: Secondary | ICD-10-CM | POA: Diagnosis not present

## 2021-06-19 NOTE — Progress Notes (Signed)
ARELYN, GAUER ([id number]) Visit Report for 06/19/2021 Arrival Information Details Patient Name: Melissa [name], Melissa [name]. Date of Service: 06/19/2021 9:00 AM Medical Record Number: [id number] Patient Account Number: [id number] Date of Birth/Sex: 1946-07-24 (75 y.o. F) Treating RN: Dolan Amen Primary Care Chistian Kasler: Miguel Aschoff Other Clinician: Jacqulyn Bath Referring Slaton Reaser: Miguel Aschoff Treating Soumya Colson/Extender: Skipper Cliche in Treatment: 5 Visit Information History Since Last Visit Added or deleted any medications: No Patient Arrived: Ambulatory Any new allergies or adverse reactions: No Arrival Time: 08:45 Had a fall or experienced change in No Accompanied By: self activities of daily living that may affect Transfer Assistance: None risk of falls: Patient Identification Verified: Yes [name] or symptoms of abuse/neglect since last visito No Secondary Verification Process Completed: Yes Hospitalized since last visit: No Patient Has Alerts: Yes Implantable device outside of the clinic excluding No Patient Alerts: NOT diabetic cellular tissue based products placed in the center since last visit: Pain Present Now: No Electronic Signature(s) Signed: 06/19/2021 1:15:52 PM By: Enedina Finner RCP, RRT, CHT Entered By: Stark Jock, Amado Nash on 06/19/2021 09:03:29 Melissa [name] ([id number]) -------------------------------------------------------------------------------- Encounter Discharge Information Details Patient Name: Melissa [name] Date of Service: 06/19/2021 9:00 AM Medical Record Number: [id number] Patient Account Number: [id number] Date of Birth/Sex: 27-Oct-1946 (75 y.o. F) Treating RN: Dolan Amen Primary Care Damyon Mullane: Miguel Aschoff Other Clinician: Jacqulyn Bath Referring Milanie Rosenfield: Miguel Aschoff Treating Essa Wenk/Extender: Skipper Cliche in Treatment: 5 Encounter Discharge Information Items Discharge  Condition: Stable Ambulatory Status: Ambulatory Discharge Destination: Home Transportation: Private Auto Accompanied By: self Schedule Follow-up Appointment: Yes Clinical Summary of Care: Notes Patient has an HBO treatment scheduled on 06/20/21 at 09:00 am. Electronic Signature(s) Signed: 06/19/2021 1:15:52 PM By: Enedina Finner RCP, RRT, CHT Entered By: Enedina Finner on 06/19/2021 13:15:25 Melissa [name] ([id number]) -------------------------------------------------------------------------------- Vitals Details Patient Name: Melissa [name] Date of Service: 06/19/2021 9:00 AM Medical Record Number: [id number] Patient Account Number: [id number] Date of Birth/Sex: 09/12/1946 (75 y.o. F) Treating RN: Dolan Amen Primary Care Joandry Slagter: Miguel Aschoff Other Clinician: Jacqulyn Bath Referring Madine Sarr: Miguel Aschoff Treating Vernesha Talbot/Extender: Skipper Cliche in Treatment: 5 Vital [name] Time Taken: 08:50 Temperature (F): 98.3 Height (in): 61.5 Pulse (bpm): 78 Weight (lbs): 178 Respiratory Rate (breaths/min): 18 Body Mass Index (BMI): 33.1 Blood Pressure (mmHg): 140/60 Reference Range: 80 - 120 mg / dl Electronic Signature(s) Signed: 06/19/2021 1:15:52 PM By: Enedina Finner RCP, RRT, CHT Entered By: Enedina Finner on 06/19/2021 09:04:02

## 2021-06-20 ENCOUNTER — Encounter: Payer: PPO | Admitting: Physician Assistant

## 2021-06-20 ENCOUNTER — Other Ambulatory Visit: Payer: Self-pay

## 2021-06-20 DIAGNOSIS — L598 Other specified disorders of the skin and subcutaneous tissue related to radiation: Secondary | ICD-10-CM | POA: Diagnosis not present

## 2021-06-20 NOTE — Progress Notes (Signed)
DAVINIA, RICCARDI ([id number]) Visit Report for 06/20/2021 Arrival Information Details Patient Name: NYCOLE, KAWAHARA. Date of Service: 06/20/2021 9:00 AM Medical Record Number: [id number] Patient Account Number: [id number] Date of Birth/Sex: 14-Apr-1946 (75 y.o. F) Treating RN: Carlene Coria Primary Care Kameah Rawl: Miguel Aschoff Other Clinician: Jacqulyn Bath Referring Caylah Plouff: Miguel Aschoff Treating Ryleigh Buenger/Extender: Skipper Cliche in Treatment: 5 Visit Information History Since Last Visit Added or deleted any medications: No Patient Arrived: Ambulatory Any new allergies or adverse reactions: No Arrival Time: 08:50 Had a fall or experienced change in No Accompanied By: self activities of daily living that may affect Transfer Assistance: None risk of falls: Patient Identification Verified: Yes Signs or symptoms of abuse/neglect since last visito No Secondary Verification Process Completed: Yes Hospitalized since last visit: No Patient Has Alerts: Yes Implantable device outside of the clinic excluding No Patient Alerts: NOT diabetic cellular tissue based products placed in the center since last visit: Pain Present Now: No Electronic Signature(s) Signed: 06/20/2021 1:37:37 PM By: Enedina Finner RCP, RRT, CHT Entered By: Stark Jock, Amado Nash on 06/20/2021 09:04:13 [name] ([id number]) -------------------------------------------------------------------------------- Encounter Discharge Information Details Patient Name: [name] Date of Service: 06/20/2021 9:00 AM Medical Record Number: [id number] Patient Account Number: [id number] Date of Birth/Sex: 1946/02/22 (75 y.o. F) Treating RN: Carlene Coria Primary Care Chamika Cunanan: Miguel Aschoff Other Clinician: Jacqulyn Bath Referring Sharia Averitt: Miguel Aschoff Treating Lillie Portner/Extender: Skipper Cliche in Treatment: 5 Encounter Discharge Information Items Discharge Condition:  Stable Ambulatory Status: Ambulatory Discharge Destination: Home Transportation: Private Auto Accompanied By: self Schedule Follow-up Appointment: Yes Clinical Summary of Care: Notes Patient has an HBO treatment scheduled on 06/23/21 at 09:00 am. Electronic Signature(s) Signed: 06/20/2021 1:37:37 PM By: Enedina Finner RCP, RRT, CHT Entered By: Enedina Finner on 06/20/2021 13:37:13 [name] ([id number]) -------------------------------------------------------------------------------- Vitals Details Patient Name: [name] Date of Service: 06/20/2021 9:00 AM Medical Record Number: [id number] Patient Account Number: [id number] Date of Birth/Sex: 01/19/1946 (75 y.o. F) Treating RN: Carlene Coria Primary Care Vaiden Adames: Miguel Aschoff Other Clinician: Jacqulyn Bath Referring Aariyah Sampey: Miguel Aschoff Treating [place]/Extender: Skipper Cliche in Treatment: 5 Vital Signs Time Taken: 08:55 Temperature (F): 98.3 Height (in): 61.5 Pulse (bpm): 78 Weight (lbs): 178 Respiratory Rate (breaths/min): 18 Body Mass Index (BMI): 33.1 Blood Pressure (mmHg): 140/60 Reference Range: 80 - 120 mg / dl Electronic Signature(s) Signed: 06/20/2021 1:37:37 PM By: Enedina Finner RCP, RRT, CHT Entered By: Enedina Finner on 06/20/2021 09:04:36

## 2021-06-20 NOTE — Progress Notes (Signed)
Melissa, [name] ([id number]) Visit Report for 06/19/2021 HBO Details Patient Name: Melissa [name], Melissa [name]. Date of Service: 06/19/2021 9:00 AM Medical Record Number: [id number] Patient Account Number: [id number] Date of Birth/Sex: 11/22/1946 (75 y.o. 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 HBO Treatment Details Treatment Number: 12 Patient Type: Outpatient Chamber Type: Monoplace Chamber Serial #: [id number] Treatment Protocol: 2.0 ATA with 90 minutes oxygen, with two 5 minute air breaks Treatment Details Compression Rate Down: 1.5 psi / minute De-Compression Rate Up: Compress Tx Pressure Air breaks and breathing periods Decompress Decompress Begins Reached (leave unused spaces blank) Begins Ends Chamber Pressure (ATA) 1 2 2 2 2 2  --2 1 Clock Time (24 hr) 09:02 09:17 09:48 09:53 10:23 10:29 - - 10:59 11:14 Treatment Length: 132 (minutes) Treatment Segments: 4 Vital [name] Capillary Blood Glucose Reference Range: 80 - 120 mg / dl HBO Diabetic Blood Glucose Intervention Range: <131 mg/dl or >249 mg/dl Time Vitals Blood Respiratory Capillary Blood Glucose Pulse Action Type: Pulse: Temperature: Taken: Pressure: Rate: Glucose (mg/dl): Meter #: Oximetry (%) Taken: Pre 08:50 140/60 78 18 98.3 Post 11:17 118/64 78 18 98.3 Treatment Response Treatment Toleration: Well Treatment Completion Treatment Completed without Adverse Event Status: Electronic Signature(s) Signed: 06/19/2021 1:15:52 PM By: Enedina Finner RCP, RRT, CHT Signed: 06/19/2021 5:40:29 PM By: Worthy Keeler PA-C Entered By: Stark Jock,  Amado Nash on 06/19/2021 13:14:37 Melissa [name] ([id number]) -------------------------------------------------------------------------------- HBO Safety Checklist Details Patient Name: Melissa [name]. Date of Service: 06/19/2021 9:00 AM Medical Record Number: [id number] Patient Account Number: [id number] Date of Birth/Sex: Nov 19, 1946 (75 y.o. F) Treating RN: Dolan Amen Primary Care Laresa Oshiro: Miguel Aschoff Other Clinician: Jacqulyn Bath Referring Cailyn Houdek: Miguel Aschoff Treating Shaune Malacara/Extender: Skipper Cliche in Treatment: 5 HBO Safety Checklist Items Safety Checklist Consent Form Signed Patient voided / foley secured and emptied When did you last eato 07:00 am Last dose of injectable or oral agent na Ostomy pouch emptied and vented if applicable NA All implantable devices assessed, documented and approved NA Intravenous access site secured and place NA Valuables secured Linens and cotton and cotton/polyester blend (less than 51% polyester) Personal oil-based products / skin lotions / body lotions removed Wigs or hairpieces removed NA Smoking or tobacco materials removed NA Books / newspapers / magazines / loose paper removed NA Cologne, aftershave, perfume and deodorant removed Jewelry removed (may wrap wedding band) Make-up removed Hair care products removed Battery operated devices (external) removed NA Heating patches and chemical warmers removed NA Titanium eyewear removed NA Nail polish cured greater than 10 hours NA Casting material cured greater than 10 hours NA Hearing aids removed NA Loose dentures or partials removed NA Prosthetics have been removed NA Patient demonstrates correct use of air break device (if applicable) Patient concerns have been addressed Patient grounding bracelet on and cord attached to chamber Specifics for Inpatients (complete in addition to above) Medication sheet sent with patient Intravenous  medications needed or due during therapy sent with patient Drainage tubes (e.g. nasogastric tube or chest tube secured and vented) Endotracheal or Tracheotomy tube secured Cuff deflated of air and inflated with saline Airway suctioned Electronic Signature(s) Signed: 06/19/2021 1:15:52 PM By: Enedina Finner RCP, RRT, CHT Entered By: Enedina Finner on 06/19/2021  09:05:04 

## 2021-06-20 NOTE — Progress Notes (Signed)
KORBYN, VANES ([id number]) Visit Report for 06/18/2021 HBO Details Patient Name: Melissa [name], Melissa [name]. Date of Service: 06/18/2021 9:00 AM Medical Record Number: [id number] Patient Account Number: [id number] Date of Birth/Sex: Nov 21, 1946 (75 y.o. 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 HBO Treatment Details Treatment Number: 11 Patient Type: Outpatient Chamber Type: Monoplace Chamber Serial #: [id number] Treatment Protocol: 2.0 ATA with 90 minutes oxygen, with two 5 minute air breaks Treatment Details Compression Rate Down: 1.5 psi / minute De-Compression Rate Up: 1.5 psi / minute Compress Tx Pressure Air breaks and breathing periods Decompress Decompress Begins Reached (leave unused spaces blank) Begins Ends Chamber Pressure (ATA) 1 2 2 2 2 2  --2 1 Clock Time (24 hr) 08:56 09:11 09:41 09:46 10:17 10:22 - - 10:52 11:07 Treatment Length: 131 (minutes) Treatment Segments: 4 Vital Signs Capillary Blood Glucose Reference Range: 80 - 120 mg / dl HBO Diabetic Blood Glucose Intervention Range: <131 mg/dl or >249 mg/dl Time Vitals Blood Respiratory Capillary Blood Glucose Pulse Action Type: Pulse: Temperature: Taken: Pressure: Rate: Glucose (mg/dl): Meter #: Oximetry (%) Taken: Pre 08:50 134/62 78 18 98.3 Post 11:10 118/62 78 18 98.6 Treatment Response Treatment Toleration: Well Treatment Completion Treatment Completed without Adverse Event Status: HBO Attestation I certify that I supervised this HBO treatment in accordance with Medicare guidelines. A trained emergency response team is readily Yes available per  hospital policies and procedures. Continue HBOT as ordered. Yes Electronic Signature(s) Signed: 06/20/2021 3:02:53 PM By: Kalman Shan DO Previous Signature: 06/18/2021 11:23:42 AM Version By: Enedina Finner RCP, RRT, CHT Entered By: Kalman Shan on 06/20/2021 14:55:42 [name] ([id number]) -------------------------------------------------------------------------------- HBO Safety Checklist Details Patient Name: [name] Date of Service: 06/18/2021 9:00 AM Medical Record Number: [id number] Patient Account Number: [id number] Date of Birth/Sex: 1946-06-25 (75 y.o. F) Treating RN: Cornell Barman Primary Care Takita Riecke: Miguel Aschoff Other Clinician: Jacqulyn Bath Referring Yazlin Ekblad: Miguel Aschoff Treating Gerarda Conklin/Extender: Yaakov Guthrie in Treatment: 4 HBO Safety Checklist Items Safety Checklist Consent Form Signed Patient voided / foley secured and emptied When did you last eato 07:00 am Last dose of injectable or oral agent na Ostomy pouch emptied and vented if applicable NA All implantable devices assessed, documented and approved NA Intravenous access site secured and place NA Valuables secured Linens and cotton and cotton/polyester blend (less than 51% polyester) Personal oil-based products / skin lotions / body lotions removed Wigs or hairpieces removed NA Smoking or tobacco materials removed NA Books / newspapers / magazines / loose paper removed NA Cologne, aftershave, perfume and deodorant removed Jewelry removed (may wrap wedding band) Make-up removed Hair care products removed Battery operated devices (external) removed NA Heating patches and chemical warmers removed NA Titanium eyewear removed NA Nail polish cured greater than 10 hours NA Casting material cured greater than 10 hours NA Hearing aids removed NA Loose dentures or partials removed NA Prosthetics have been removed NA Patient demonstrates  correct use of air break device (if applicable) Patient concerns have been addressed Patient grounding bracelet on and cord attached to chamber Specifics for Inpatients (complete in addition to above) Medication sheet sent with patient Intravenous medications needed or due during therapy sent with patient Drainage tubes (e.g. nasogastric tube or chest tube  secured and vented) Endotracheal or Tracheotomy tube secured Cuff deflated of air and inflated with saline Airway suctioned Electronic Signature(s) Signed: 06/18/2021 11:23:42 AM By: Enedina Finner RCP, RRT, CHT Entered By: Stark Jock, Amado Nash on 06/18/2021 08:58:57

## 2021-06-20 NOTE — Progress Notes (Signed)
Melissa [name] ([id number]) Visit Report for 06/20/2021 HBO Details Patient Name: Melissa [name], Melissa [name]. Date of Service: 06/20/2021 9:00 AM Medical Record Number: [id number] Patient Account Number: [id number] Date of Birth/Sex: [birth year] (75 y.o. 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 HBO Treatment Details Treatment Number: 13 Patient Type: Outpatient Chamber Type: Monoplace Chamber Serial #: [id number] Treatment Protocol: 2.0 ATA with 90 minutes oxygen, with two 5 minute air breaks Treatment Details Compression Rate Down: 1.5 psi / minute De-Compression Rate Up: 2.0 psi / minute Compress Tx Pressure Air breaks and breathing periods Decompress Decompress Begins Reached (leave unused spaces blank) Begins Ends Chamber Pressure (ATA) 1 2 2 2 2 2  --2 1 Clock Time (24 hr) 09:03 09:18 09:48 09:53 10:24 10:29 - - 10:59 11:10 Treatment Length: 127 (minutes) Treatment Segments: 4 Vital [name] Capillary Blood Glucose Reference Range: 80 - 120 mg / dl HBO Diabetic Blood Glucose Intervention Range: <131 mg/dl or >249 mg/dl Time Vitals Blood Respiratory Capillary Blood Glucose Pulse Action Type: Pulse: Temperature: Taken: Pressure: Rate: Glucose (mg/dl): Meter #: Oximetry (%) Taken: Pre 08:55 140/60 78 18 98.3 Post 11:15 118/64 78 18 98.3 Treatment Response Treatment Toleration: Well Treatment Completion Treatment Completed without Adverse Event Status: Electronic Signature(s) Signed: 06/20/2021 1:37:37 PM By: Enedina Finner RCP, RRT, CHT Signed: 06/20/2021 4:42:29 PM By: Worthy Keeler PA-C Entered By:  Stark Jock, Amado Nash on 06/20/2021 13:29:01 Melissa [name] ([id number]) -------------------------------------------------------------------------------- HBO Safety Checklist Details Patient Name: Melissa [name]. Date of Service: 06/20/2021 9:00 AM Medical Record Number: [id number] Patient Account Number: [id number] Date of Birth/Sex: 11-Mar-1946 (75 y.o. F) Treating RN: Carlene Coria Primary Care Aadit Hagood: Miguel Aschoff Other Clinician: Jacqulyn Bath Referring Jeyden Coffelt: Miguel Aschoff Treating Chrisanne Loose/Extender: Skipper Cliche in Treatment: 5 HBO Safety Checklist Items Safety Checklist Consent Form Signed Patient voided / foley secured and emptied When did you last eato 07:00 am Last dose of injectable or oral agent na Ostomy pouch emptied and vented if applicable NA All implantable devices assessed, documented and approved NA Intravenous access site secured and place NA Valuables secured Linens and cotton and cotton/polyester blend (less than 51% polyester) Personal oil-based products / skin lotions / body lotions removed Wigs or hairpieces removed NA Smoking or tobacco materials removed NA Books / newspapers / magazines / loose paper removed NA Cologne, aftershave, perfume and deodorant removed Jewelry removed (may wrap wedding band) Make-up removed Hair care products removed Battery operated devices (external) removed NA Heating patches and chemical warmers removed NA Titanium eyewear removed NA Nail polish cured greater than 10 hours NA Casting material cured greater than 10 hours NA Hearing aids removed NA Loose dentures or partials removed NA Prosthetics have been removed NA Patient demonstrates correct use of air break device (if applicable) Patient concerns have been addressed Patient grounding bracelet on and cord attached to chamber Specifics for Inpatients (complete in addition to above) Medication sheet sent with  patient Intravenous medications needed or due during therapy sent with patient Drainage tubes (e.g. nasogastric tube or chest tube secured and vented) Endotracheal or Tracheotomy tube secured Cuff deflated of air and inflated with saline Airway suctioned Electronic Signature(s) Signed: 06/20/2021 1:37:37 PM By: Enedina Finner RCP, RRT, CHT Entered By: Juleen China,  Durene Fruits on 06/20/2021 09:05:39

## 2021-06-23 ENCOUNTER — Encounter: Payer: PPO | Admitting: Physician Assistant

## 2021-06-23 ENCOUNTER — Other Ambulatory Visit: Payer: Self-pay

## 2021-06-23 DIAGNOSIS — L598 Other specified disorders of the skin and subcutaneous tissue related to radiation: Secondary | ICD-10-CM | POA: Diagnosis not present

## 2021-06-23 NOTE — Progress Notes (Signed)
CHIMENE, SALO ([id number]) Visit Report for 06/23/2021 Arrival Information Details Patient Name: Melissa [name], Melissa [name]. Date of Service: 06/23/2021 9:00 AM Medical Record Number: [id number] Patient Account Number: [id number] Date of Birth/Sex: 07-14-1946 (75 y.o. F) Treating RN: Donnamarie Poag Primary Care Sybol Morre: Miguel Aschoff Other Clinician: Jacqulyn Bath Referring Olar Santini: Miguel Aschoff Treating Grey Schlauch/Extender: Skipper Cliche in Treatment: 5 Visit Information History Since Last Visit Added or deleted any medications: No Patient Arrived: Ambulatory Any new allergies or adverse reactions: No Arrival Time: 08:45 Had a fall or experienced change in No Accompanied By: self activities of daily living that may affect Transfer Assistance: None risk of falls: Patient Identification Verified: Yes [name] or symptoms of abuse/neglect since last visito No Secondary Verification Process Completed: Yes Hospitalized since last visit: No Patient Has Alerts: Yes Implantable device outside of the clinic excluding No Patient Alerts: NOT diabetic cellular tissue based products placed in the center since last visit: Pain Present Now: No Electronic Signature(s) Signed: 06/23/2021 1:54:22 PM By: Enedina Finner RCP, RRT, CHT Entered By: Stark Jock, Amado Nash on 06/23/2021 09:46:17 Melissa [name] ([id number]) -------------------------------------------------------------------------------- Encounter Discharge Information Details Patient Name: Melissa [name] Date of Service: 06/23/2021 9:00 AM Medical Record Number: [id number] Patient Account Number: [id number] Date of Birth/Sex: July 06, 1946 (75 y.o. F) Treating RN: Donnamarie Poag Primary Care Samuell Knoble: Miguel Aschoff Other Clinician: Jacqulyn Bath Referring Vanassa Penniman: Miguel Aschoff Treating Alver Leete/Extender: Skipper Cliche in Treatment: 5 Encounter Discharge Information Items Discharge Condition:  Stable Ambulatory Status: Ambulatory Discharge Destination: Home Transportation: Private Auto Accompanied By: self Schedule Follow-up Appointment: Yes Clinical Summary of Care: Notes Patient has an HBO treatment scheduled on 06/24/21 at 09:00 am. Electronic Signature(s) Signed: 06/23/2021 1:54:22 PM By: Enedina Finner RCP, RRT, CHT Entered By: Enedina Finner on 06/23/2021 11:26:58 Melissa [name] ([id number]) -------------------------------------------------------------------------------- Vitals Details Patient Name: Melissa [name] Date of Service: 06/23/2021 9:00 AM Medical Record Number: [id number] Patient Account Number: [id number] Date of Birth/Sex: Jun 18, 1946 (75 y.o. F) Treating RN: Donnamarie Poag Primary Care Jaythan Hinely: Miguel Aschoff Other Clinician: Jacqulyn Bath Referring Dago Jungwirth: Miguel Aschoff Treating Cregg Jutte/Extender: Skipper Cliche in Treatment: 5 Vital [name] Time Taken: 08:55 Temperature (F): 98.3 Height (in): 61.5 Pulse (bpm): 72 Weight (lbs): 178 Respiratory Rate (breaths/min): 18 Body Mass Index (BMI): 33.1 Blood Pressure (mmHg): 132/60 Reference Range: 80 - 120 mg / dl Electronic Signature(s) Signed: 06/23/2021 1:54:22 PM By: Enedina Finner RCP, RRT, CHT Entered By: Enedina Finner on 06/23/2021 09:47:33

## 2021-06-23 NOTE — Progress Notes (Signed)
Melissa [name], Melissa [name] ([id number]) Visit Report for 06/23/2021 Problem List Details Patient Name: Melissa [name], Melissa [name]. Date of Service: 06/23/2021 9:00 AM Medical Record Number: [id number] Patient Account Number: [id number] Date of Birth/Sex: 12-21-1945 (75 y.o. 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 Date of Service: 06/23/2021 Medical Record Number: [id number] Patient Account Number: [id number] Date of Birth/Sex: [birth year] (75 y.o. 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, rectosigmoid junction, and anus Facility Procedures CPT4 Code: 45859292 Description: (Facility Use Only) HBOT, full body chamber, 5min Modifier: Quantity: 4 Physician Procedures CPT4 Code: 4462863 Description: 81771 - WC PHYS HYPERBARIC OXYGEN THERAPY Modifier: Quantity: 1 CPT4  Code: Description: ICD-10 Diagnosis Description L59.8 Other specified disorders of the skin and subcutaneous tissue related to Z85.048 Personal history of other malignant neoplasm of rectum, rectosigmoid junc Modifier: radiation tion, and anus Quantity: Electronic Signature(s) Signed: 06/23/2021 5:01:36 PM By: Worthy Keeler PA-C Previous Signature: 06/23/2021 1:54:22 PM Version By: Enedina Finner RCP, RRT, CHT Entered By: Worthy Keeler on 06/23/2021 17:01:36

## 2021-06-23 NOTE — Progress Notes (Signed)
[name], [name] ([id number]) Visit Report for 06/23/2021 HBO Details Patient Name: Melissa [name], Melissa [name]. Date of Service: 06/23/2021 9:00 AM Medical Record Number: [id number] Patient Account Number: [id number] Date of Birth/Sex: 11/18/1946 (75 y.o. 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 HBO Treatment Details Treatment Number: 14 Patient Type: Outpatient Chamber Type: Monoplace Chamber Serial #: [id number] Treatment Protocol: 2.0 ATA with 90 minutes oxygen, with two 5 minute air breaks Treatment Details Compression Rate Down: 1.5 psi / minute De-Compression Rate Up: 2.5 psi / minute Compress Tx Pressure Air breaks and breathing periods Decompress Decompress Begins Reached (leave unused spaces blank) Begins Ends Chamber Pressure (ATA) 1 2 2 2 2 2  --2 1 Clock Time (24 hr) 09:04 09:19 09:50 09:55 10:25 10:31 - - 11:02 11:16 Treatment Length: 132 (minutes) Treatment Segments: 4 Vital [name] Capillary Blood Glucose Reference Range: 80 - 120 mg / dl HBO Diabetic Blood Glucose Intervention Range: <131 mg/dl or >249 mg/dl Time Vitals Blood Respiratory Capillary Blood Glucose Pulse Action Type: Pulse: Temperature: Taken: Pressure: Rate: Glucose (mg/dl): Meter #: Oximetry (%) Taken: Pre 08:55 132/60 72 18 98.3 Post 11:20 122/62 72 18 98.1 Treatment Response Treatment Toleration: Well Treatment Completion Treatment Completed without Adverse Event Status: HBO Attestation I certify that I supervised this HBO treatment in accordance with Medicare guidelines. A trained emergency response team is readily Yes available per  hospital policies and procedures. Continue HBOT as ordered. Yes Electronic Signature(s) Signed: 06/23/2021 5:01:31 PM By: Worthy Keeler PA-C Previous Signature: 06/23/2021 1:54:22 PM Version By: Enedina Finner RCP, RRT, CHT Entered By: Worthy Keeler on 06/23/2021 17:01:31 Melissa [name] ([id number]) -------------------------------------------------------------------------------- HBO Safety Checklist Details Patient Name: Melissa [name] Date of Service: 06/23/2021 9:00 AM Medical Record Number: [id number] Patient Account Number: [id number] Date of Birth/Sex: 03-01-1946 (75 y.o. F) Treating RN: Donnamarie Poag Primary Care Jeryn Bertoni: Miguel Aschoff Other Clinician: Jacqulyn Bath Referring Lanika Colgate: Miguel Aschoff Treating Pratt Bress/Extender: Skipper Cliche in Treatment: 5 HBO Safety Checklist Items Safety Checklist Consent Form Signed Patient voided / foley secured and emptied When did you last eato 07:00 am Last dose of injectable or oral agent na Ostomy pouch emptied and vented if applicable NA All implantable devices assessed, documented and approved NA Intravenous access site secured and place NA Valuables secured Linens and cotton and cotton/polyester blend (less than 51% polyester) Personal oil-based products / skin lotions / body lotions removed Wigs or hairpieces removed NA Smoking or tobacco materials removed NA Books / newspapers / magazines / loose paper removed NA Cologne, aftershave, perfume and deodorant removed Jewelry removed (may wrap wedding band) Make-up removed Hair care products removed Battery operated devices (external) removed NA Heating patches and chemical warmers removed NA Titanium eyewear removed NA Nail polish cured greater than 10 hours NA Casting material cured greater than 10 hours NA Hearing aids removed NA Loose dentures or partials removed NA Prosthetics have been removed NA Patient demonstrates  correct use of air break device (if applicable) Patient concerns have been addressed Patient grounding bracelet on and cord attached to chamber Specifics for Inpatients (complete in addition to above) Medication sheet sent with patient Intravenous medications needed or due during therapy sent with patient Drainage tubes (e.g. nasogastric tube or  chest tube secured and vented) Endotracheal or Tracheotomy tube secured Cuff deflated of air and inflated with saline Airway suctioned Electronic Signature(s) Signed: 06/23/2021 1:54:22 PM By: Enedina Finner RCP, RRT, CHT Entered By: Enedina Finner on 06/23/2021 09:48:27

## 2021-06-24 ENCOUNTER — Encounter: Payer: PPO | Admitting: Physician Assistant

## 2021-06-24 DIAGNOSIS — L598 Other specified disorders of the skin and subcutaneous tissue related to radiation: Secondary | ICD-10-CM | POA: Diagnosis not present

## 2021-06-24 NOTE — Progress Notes (Signed)
XCARET, MORAD ([id number]) Visit Report for 06/24/2021 Arrival Information Details Patient Name: Melissa [name], Melissa [name]. Date of Service: 06/24/2021 9:00 AM Medical Record Number: [id number] Patient Account Number: [id number] Date of Birth/Sex: [birth year] (75 y.o. F) Treating RN: Dolan Amen Primary Care Tryphena Perkovich: Miguel Aschoff Other Clinician: Jacqulyn Bath Referring Lisaanne Lawrie: Miguel Aschoff Treating Brynda Heick/Extender: Skipper Cliche in Treatment: 5 Visit Information History Since Last Visit Added or deleted any medications: No Patient Arrived: Ambulatory Any new allergies or adverse reactions: No Arrival Time: 08:50 Had a fall or experienced change in No Accompanied By: self activities of daily living that may affect Transfer Assistance: None risk of falls: Patient Identification Verified: Yes [name] or symptoms of abuse/neglect since last visito No Secondary Verification Process Completed: Yes Hospitalized since last visit: No Patient Has Alerts: Yes Implantable device outside of the clinic excluding No Patient Alerts: NOT diabetic cellular tissue based products placed in the center since last visit: Pain Present Now: No Electronic Signature(s) Signed: 06/24/2021 12:54:04 PM By: Enedina Finner RCP, RRT, CHT Entered By: Stark Jock, Amado Nash on 06/24/2021 09:15:51 Melissa [name] ([id number]) -------------------------------------------------------------------------------- Encounter Discharge Information Details Patient Name: Melissa [name] Date of Service: 06/24/2021 9:00 AM Medical Record Number: [id number] Patient Account Number: [id number] Date of Birth/Sex: 07-05-1946 (75 y.o. F) Treating RN: Dolan Amen Primary Care Evalyn Shultis: Miguel Aschoff Other Clinician: Jacqulyn Bath Referring Aidan Moten: Miguel Aschoff Treating Calen Geister/Extender: Skipper Cliche in Treatment: 5 Encounter Discharge Information Items Discharge  Condition: Stable Ambulatory Status: Ambulatory Discharge Destination: Home Transportation: Private Auto Accompanied By: self Schedule Follow-up Appointment: Yes Clinical Summary of Care: Notes Patient has an HBO treatment scheduled on 06/25/21 at 09:00 am. Electronic Signature(s) Signed: 06/24/2021 12:54:04 PM By: Enedina Finner RCP, RRT, CHT Entered By: Enedina Finner on 06/24/2021 12:53:46 Melissa [name] ([id number]) -------------------------------------------------------------------------------- Vitals Details Patient Name: Melissa [name] Date of Service: 06/24/2021 9:00 AM Medical Record Number: [id number] Patient Account Number: [id number] Date of Birth/Sex: 23-Apr-1946 (75 y.o. F) Treating RN: Dolan Amen Primary Care Tru Leopard: Miguel Aschoff Other Clinician: Jacqulyn Bath Referring Siriyah Ambrosius: Miguel Aschoff Treating Avigayil Ton/Extender: Skipper Cliche in Treatment: 5 Vital [name] Time Taken: 08:55 Temperature (F): 98.4 Height (in): 61.5 Pulse (bpm): 72 Weight (lbs): 178 Respiratory Rate (breaths/min): 18 Body Mass Index (BMI): 33.1 Blood Pressure (mmHg): 132/62 Reference Range: 80 - 120 mg / dl Electronic Signature(s) Signed: 06/24/2021 12:54:04 PM By: Enedina Finner RCP, RRT, CHT Entered By: Enedina Finner on 06/24/2021 09:17:02

## 2021-06-25 ENCOUNTER — Encounter (HOSPITAL_BASED_OUTPATIENT_CLINIC_OR_DEPARTMENT_OTHER): Payer: PPO | Admitting: Internal Medicine

## 2021-06-25 ENCOUNTER — Other Ambulatory Visit: Payer: Self-pay

## 2021-06-25 DIAGNOSIS — Z85048 Personal history of other malignant neoplasm of rectum, rectosigmoid junction, and anus: Secondary | ICD-10-CM | POA: Diagnosis not present

## 2021-06-25 DIAGNOSIS — L598 Other specified disorders of the skin and subcutaneous tissue related to radiation: Secondary | ICD-10-CM

## 2021-06-25 NOTE — Progress Notes (Signed)
[name], [name] ([id number]) Visit Report for 06/25/2021 HBO Details Patient Name: Melissa [name], Melissa [name]. Date of Service: 06/25/2021 9:00 AM Medical Record Number: [id number] Patient Account Number: [id number] Date of Birth/Sex: July 28, 1946 (75 y.o. 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 HBO Treatment Details Treatment Number: 16 Patient Type: Outpatient Chamber Type: Monoplace Chamber Serial #: [id number] Treatment Protocol: 2.0 ATA with 90 minutes oxygen, with two 5 minute air breaks Treatment Details Compression Rate Down: 1.5 psi / minute De-Compression Rate Up: 2.0 psi / minute Compress Tx Pressure Air breaks and breathing periods Decompress Decompress Begins Reached (leave unused spaces blank) Begins Ends Chamber Pressure (ATA) 1 2 2 2 2 2  --2 1 Clock Time (24 hr) 08:57 09:12 09:43 09:48 10:18 10:23 - - 10:54 11:08 Treatment Length: 131 (minutes) Treatment Segments: 4 Vital [name] Capillary Blood Glucose Reference Range: 80 - 120 mg / dl HBO Diabetic Blood Glucose Intervention Range: <131 mg/dl or >249 mg/dl Time Vitals Blood Respiratory Capillary Blood Glucose Pulse Action Type: Pulse: Temperature: Taken: Pressure: Rate: Glucose (mg/dl): Meter #: Oximetry (%) Taken: Pre 08:55 140/60 84 18 98.4 Post 11:12 122/64 72 18 98.5 Treatment Response Treatment Toleration: Well Treatment Completion Treatment Completed without Adverse Event Status: HBO Attestation I certify that I supervised this HBO treatment in accordance with Medicare guidelines. A trained emergency response team is readily Yes available per  hospital policies and procedures. Continue HBOT as ordered. Yes Electronic Signature(s) Signed: 06/25/2021 2:26:31 PM By: Kalman Shan DO Previous Signature: 06/25/2021 12:40:25 PM Version By: Enedina Finner RCP, RRT, CHT Entered By: Kalman Shan on 06/25/2021 14:26:08 Melissa [name] ([id number]) -------------------------------------------------------------------------------- HBO Safety Checklist Details Patient Name: Melissa [name] Date of Service: 06/25/2021 9:00 AM Medical Record Number: [id number] Patient Account Number: [id number] Date of Birth/Sex: August 21, 1946 (75 y.o. F) Treating RN: Cornell Barman Primary Care Kalai Baca: Miguel Aschoff Other Clinician: Jacqulyn Bath Referring Kaiyla Stahly: Miguel Aschoff Treating Marketta Valadez/Extender: Yaakov Guthrie in Treatment: 5 HBO Safety Checklist Items Safety Checklist Consent Form Signed Patient voided / foley secured and emptied When did you last eato 07:00 am Last dose of injectable or oral agent na Ostomy pouch emptied and vented if applicable NA All implantable devices assessed, documented and approved NA Intravenous access site secured and place NA Valuables secured Linens and cotton and cotton/polyester blend (less than 51% polyester) Personal oil-based products / skin lotions / body lotions removed Wigs or hairpieces removed NA Smoking or tobacco materials removed NA Books / newspapers / magazines / loose paper removed NA Cologne, aftershave, perfume and deodorant removed Jewelry removed (may wrap wedding band) Make-up removed Hair care products removed Battery operated devices (external) removed NA Heating patches and chemical warmers removed NA Titanium eyewear removed NA Nail polish cured greater than 10 hours NA Casting material cured greater than 10 hours NA Hearing aids removed NA Loose dentures or partials removed NA Prosthetics have been removed NA Patient demonstrates  correct use of air break device (if applicable) Patient concerns have been addressed Patient grounding bracelet on and cord attached to chamber Specifics for Inpatients (complete in addition to above) Medication sheet sent with patient Intravenous medications needed or due during therapy sent with patient Drainage tubes (e.g. nasogastric tube or chest tube  secured and vented) Endotracheal or Tracheotomy tube secured Cuff deflated of air and inflated with saline Airway suctioned Electronic Signature(s) Signed: 06/25/2021 12:40:25 PM By: Enedina Finner RCP, RRT, CHT Entered By: Enedina Finner on 06/25/2021 09:06:18

## 2021-06-25 NOTE — Progress Notes (Signed)
EVANGELA, HEFFLER ([id number]) Visit Report for 06/24/2021 HBO Details Patient Name: Melissa [name], Melissa [name]. Date of Service: 06/24/2021 9:00 AM Medical Record Number: [id number] Patient Account Number: [id number] Date of Birth/Sex: 12-14-1945 (75 y.o. 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 HBO Treatment Details Treatment Number: 15 Patient Type: Outpatient Chamber Type: Monoplace Chamber Serial #: [id number] Treatment Protocol: 2.0 ATA with 90 minutes oxygen, with two 5 minute air breaks Treatment Details Compression Rate Down: 1.5 psi / minute De-Compression Rate Up: 1.5 psi / minute Compress Tx Pressure Air breaks and breathing periods Decompress Decompress Begins Reached (leave unused spaces blank) Begins Ends Chamber Pressure (ATA) 1 2 2 2 2 2  --2 1 Clock Time (24 hr) 09:04 09:19 09:49 09:54 10:25 10:30 - - 11:00 11:15 Treatment Length: 131 (minutes) Treatment Segments: 4 Vital [name] Capillary Blood Glucose Reference Range: 80 - 120 mg / dl HBO Diabetic Blood Glucose Intervention Range: <131 mg/dl or >249 mg/dl Time Vitals Blood Respiratory Capillary Blood Glucose Pulse Action Type: Pulse: Temperature: Taken: Pressure: Rate: Glucose (mg/dl): Meter #: Oximetry (%) Taken: Pre 08:55 132/62 72 18 98.4 Post 11:19 140/66 72 18 98.4 Treatment Response Treatment Toleration: Well Treatment Completion Treatment Completed without Adverse Event Status: Electronic Signature(s) Signed: 06/24/2021 12:54:04 PM By: Enedina Finner RCP, RRT, CHT Signed: 06/24/2021 6:10:04 PM By: Worthy Keeler PA-C Entered By:  Stark Jock, Amado Nash on 06/24/2021 12:51:17 Melissa [name] ([id number]) -------------------------------------------------------------------------------- HBO Safety Checklist Details Patient Name: Melissa [name]. Date of Service: 06/24/2021 9:00 AM Medical Record Number: [id number] Patient Account Number: [id number] Date of Birth/Sex: Jan 16, 1946 (75 y.o. F) Treating RN: Dolan Amen Primary Care Darrold Bezek: Miguel Aschoff Other Clinician: Jacqulyn Bath Referring Arnice Vanepps: Miguel Aschoff Treating Langford Carias/Extender: Skipper Cliche in Treatment: 5 HBO Safety Checklist Items Safety Checklist Consent Form Signed Patient voided / foley secured and emptied When did you last eato 07:00 am Last dose of injectable or oral agent na Ostomy pouch emptied and vented if applicable NA All implantable devices assessed, documented and approved NA Intravenous access site secured and place NA Valuables secured Linens and cotton and cotton/polyester blend (less than 51% polyester) Personal oil-based products / skin lotions / body lotions removed Wigs or hairpieces removed NA Smoking or tobacco materials removed NA Books / newspapers / magazines / loose paper removed NA Cologne, aftershave, perfume and deodorant removed Jewelry removed (may wrap wedding band) Make-up removed Hair care products removed Battery operated devices (external) removed NA Heating patches and chemical warmers removed NA Titanium eyewear removed NA Nail polish cured greater than 10 hours NA Casting material cured greater than 10 hours NA Hearing aids removed NA Loose dentures or partials removed NA Prosthetics have been removed NA Patient demonstrates correct use of air break device (if applicable) Patient concerns have been addressed Patient grounding bracelet on and cord attached to chamber Specifics for Inpatients (complete in addition to above) Medication sheet sent with  patient Intravenous medications needed or due during therapy sent with patient Drainage tubes (e.g. nasogastric tube or chest tube secured and vented) Endotracheal or Tracheotomy tube secured Cuff deflated of air and inflated with saline Airway suctioned Electronic Signature(s) Signed: 06/24/2021 12:54:04 PM By: Enedina Finner RCP, RRT, CHT Entered By: Juleen China,  Durene Fruits on 06/24/2021 09:17:56

## 2021-06-25 NOTE — Progress Notes (Signed)
MEIAH, ZAMUDIO ([id number]) Visit Report for 06/25/2021 Arrival Information Details Patient Name: Melissa [name], Melissa [name]. Date of Service: 06/25/2021 9:00 AM Medical Record Number: [id number] Patient Account Number: [id number] Date of Birth/Sex: 1946/08/01 (75 y.o. F) Treating RN: Cornell Barman Primary Care Stephania Macfarlane: Miguel Aschoff Other Clinician: Jacqulyn Bath Referring Levar Fayson: Miguel Aschoff Treating Ashna Dorough/Extender: Yaakov Guthrie in Treatment: 5 Visit Information History Since Last Visit Added or deleted any medications: No Patient Arrived: Ambulatory Any new allergies or adverse reactions: No Arrival Time: 09:01 Had a fall or experienced change in No Accompanied By: self activities of daily living that may affect Transfer Assistance: None risk of falls: Patient Identification Verified: Yes [name] or symptoms of abuse/neglect since last visito No Secondary Verification Process Completed: Yes Hospitalized since last visit: No Patient Has Alerts: Yes Implantable device outside of the clinic excluding No Patient Alerts: NOT diabetic cellular tissue based products placed in the center since last visit: Pain Present Now: No Electronic Signature(s) Signed: 06/25/2021 12:40:25 PM By: Enedina Finner RCP, RRT, CHT Entered By: Enedina Finner on 06/25/2021 09:04:21 Melissa [name] ([id number]) -------------------------------------------------------------------------------- Encounter Discharge Information Details Patient Name: Melissa [name] Date of Service: 06/25/2021 9:00 AM Medical Record Number: [id number] Patient Account Number: [id number] Date of Birth/Sex: December 08, 1945 (75 y.o. F) Treating RN: Cornell Barman Primary Care Kamisha Ell: Miguel Aschoff Other Clinician: Jacqulyn Bath Referring Amanda Steuart: Miguel Aschoff Treating Paityn Balsam/Extender: Yaakov Guthrie in Treatment: 5 Encounter Discharge Information Items Discharge  Condition: Stable Ambulatory Status: Ambulatory Discharge Destination: Home Transportation: Private Auto Accompanied By: self Schedule Follow-up Appointment: Yes Clinical Summary of Care: Notes Patient has an HBO treatment scheduled on 06/26/21 at 09:00 am. Electronic Signature(s) Signed: 06/25/2021 12:40:25 PM By: Enedina Finner RCP, RRT, CHT Entered By: Enedina Finner on 06/25/2021 12:38:29 Melissa [name] ([id number]) -------------------------------------------------------------------------------- Vitals Details Patient Name: Melissa [name] Date of Service: 06/25/2021 9:00 AM Medical Record Number: [id number] Patient Account Number: [id number] Date of Birth/Sex: 1946/04/23 (75 y.o. F) Treating RN: Cornell Barman Primary Care Kendra Woolford: Miguel Aschoff Other Clinician: Jacqulyn Bath Referring Lianna Sitzmann: Miguel Aschoff Treating Mikesha Migliaccio/Extender: Yaakov Guthrie in Treatment: 5 Vital [name] Time Taken: 08:55 Temperature (F): 98.4 Height (in): 61.5 Pulse (bpm): 84 Weight (lbs): 178 Respiratory Rate (breaths/min): 18 Body Mass Index (BMI): 33.1 Blood Pressure (mmHg): 140/60 Reference Range: 80 - 120 mg / dl Electronic Signature(s) Signed: 06/25/2021 12:40:25 PM By: Enedina Finner RCP, RRT, CHT Entered By: Enedina Finner on 06/25/2021 09:04:51

## 2021-06-26 ENCOUNTER — Encounter: Payer: PPO | Admitting: Physician Assistant

## 2021-06-26 DIAGNOSIS — L598 Other specified disorders of the skin and subcutaneous tissue related to radiation: Secondary | ICD-10-CM | POA: Diagnosis not present

## 2021-06-26 NOTE — Progress Notes (Signed)
AYME, SHORT ([id number]) Visit Report for 06/26/2021 Arrival Information Details Patient Name: Melissa [name], Melissa [name]. Date of Service: 06/26/2021 9:00 AM Medical Record Number: [id number] Patient Account Number: [id number] Date of Birth/Sex: 1946-10-21 (75 y.o. F) Treating RN: Dolan Amen Primary Care Brixon Zhen: Miguel Aschoff Other Clinician: Jacqulyn Bath Referring Lakendria Nicastro: Miguel Aschoff Treating Duc Crocket/Extender: Skipper Cliche in Treatment: 6 Visit Information History Since Last Visit Added or deleted any medications: No Patient Arrived: Ambulatory Any new allergies or adverse reactions: No Arrival Time: 08:35 Had a fall or experienced change in No Accompanied By: self activities of daily living that may affect Transfer Assistance: None risk of falls: Patient Identification Verified: Yes [name] or symptoms of abuse/neglect since last visito No Secondary Verification Process Completed: Yes Hospitalized since last visit: No Patient Has Alerts: Yes Implantable device outside of the clinic excluding No Patient Alerts: NOT diabetic cellular tissue based products placed in the center since last visit: Pain Present Now: No Electronic Signature(s) Signed: 06/26/2021 2:41:09 PM By: Enedina Finner RCP, RRT, CHT Entered By: Enedina Finner on 06/26/2021 09:24:15 Melissa [name] ([id number]) -------------------------------------------------------------------------------- Encounter Discharge Information Details Patient Name: Melissa [name] Date of Service: 06/26/2021 9:00 AM Medical Record Number: [id number] Patient Account Number: [id number] Date of Birth/Sex: [birth year] (75 y.o. F) Treating RN: Dolan Amen Primary Care Genessa Beman: Miguel Aschoff Other Clinician: Jacqulyn Bath Referring Saanvika Vazques: Miguel Aschoff Treating Jearldine Cassady/Extender: Skipper Cliche in Treatment: 6 Encounter Discharge Information Items Discharge  Condition: Stable Ambulatory Status: Ambulatory Discharge Destination: Home Transportation: Private Auto Accompanied By: self Schedule Follow-up Appointment: Yes Clinical Summary of Care: Notes Patient has an HBO treatment scheduled on 06/27/21 at 09:00 am. Electronic Signature(s) Signed: 06/26/2021 2:41:09 PM By: Enedina Finner RCP, RRT, CHT Entered By: Enedina Finner on 06/26/2021 14:40:49 Melissa [name] ([id number]) -------------------------------------------------------------------------------- [place] Details Patient Name: Melissa [name] Date of Service: 06/26/2021 9:00 AM Medical Record Number: [id number] Patient Account Number: [id number] Date of Birth/Sex: [birth year] (75 y.o. F) Treating RN: Dolan Amen Primary Care Kiano Terrien: Miguel Aschoff Other Clinician: Jacqulyn Bath Referring Bernard Donahoo: Miguel Aschoff Treating Brance Dartt/Extender: Skipper Cliche in Treatment: 6 Vital [name] Time Taken: 08:40 Temperature (F): 98.0 Height (in): 61.5 Pulse (bpm): 66 Weight (lbs): 178 Respiratory Rate (breaths/min): 18 Body Mass Index (BMI): 33.1 Blood Pressure (mmHg): 136/60 Reference Range: 80 - 120 mg / dl Electronic Signature(s) Signed: 06/26/2021 2:41:09 PM By: Enedina Finner RCP, RRT, CHT Entered By: Enedina Finner on 06/26/2021 09:24:59

## 2021-06-27 ENCOUNTER — Encounter: Payer: PPO | Admitting: Physician Assistant

## 2021-06-27 ENCOUNTER — Other Ambulatory Visit: Payer: Self-pay

## 2021-06-27 DIAGNOSIS — L598 Other specified disorders of the skin and subcutaneous tissue related to radiation: Secondary | ICD-10-CM | POA: Diagnosis not present

## 2021-06-27 NOTE — Progress Notes (Signed)
Melissa [name] ([id number]) Visit Report for 06/27/2021 Arrival Information Details Patient Name: Melissa [name]. Date of Service: 06/27/2021 9:00 AM Medical Record Number: [id number] Patient Account Number: [id number] Date of Birth/Sex: 1946/03/24 (75 y.o. F) Treating RN: Carlene Coria Primary Care Myrene Bougher: Miguel Aschoff Other Clinician: Jacqulyn Bath Referring Monike Bragdon: Miguel Aschoff Treating Christa Fasig/Extender: Skipper Cliche in Treatment: 6 Visit Information History Since Last Visit Added or deleted any medications: No Patient Arrived: Ambulatory Any new allergies or adverse reactions: No Arrival Time: 08:40 Had a fall or experienced change in No Accompanied By: self activities of daily living that may affect Transfer Assistance: None risk of falls: Patient Identification Verified: Yes [name] or symptoms of abuse/neglect since last visito No Secondary Verification Process Completed: Yes Hospitalized since last visit: No Patient Has Alerts: Yes Implantable device outside of the clinic excluding No Patient Alerts: NOT diabetic cellular tissue based products placed in the center since last visit: Pain Present Now: No Electronic Signature(s) Signed: 06/27/2021 11:44:27 AM By: Enedina Finner RCP, RRT, CHT Entered By: Enedina Finner on 06/27/2021 09:01:20 Melissa [name] ([id number]) -------------------------------------------------------------------------------- Encounter Discharge Information Details Patient Name: Melissa [name] Date of Service: 06/27/2021 9:00 AM Medical Record Number: [id number] Patient Account Number: [id number] Date of Birth/Sex: [birth year] (75 y.o. F) Treating RN: Carlene Coria Primary Care Sabriyah Wilcher: Miguel Aschoff Other Clinician: Jacqulyn Bath Referring Fabrizio Filip: Miguel Aschoff Treating Lian Tanori/Extender: Skipper Cliche in Treatment: 6 Encounter Discharge Information Items Discharge Condition:  Stable Ambulatory Status: Ambulatory Discharge Destination: Home Transportation: Private Auto Accompanied By: self Schedule Follow-up Appointment: Yes Clinical Summary of Care: Notes Patient has an HBO treatment scheduled on 06/30/21 at 09:00 am. Electronic Signature(s) Signed: 06/27/2021 11:44:27 AM By: Enedina Finner RCP, RRT, CHT Entered By: Enedina Finner on 06/27/2021 11:44:03 Melissa [name] ([id number]) -------------------------------------------------------------------------------- Vitals Details Patient Name: Melissa [name] Date of Service: 06/27/2021 9:00 AM Medical Record Number: [id number] Patient Account Number: [id number] Date of Birth/Sex: 1946/03/03 (75 y.o. F) Treating RN: Carlene Coria Primary Care Taraji Mungo: Miguel Aschoff Other Clinician: Jacqulyn Bath Referring Girtie Wiersma: Miguel Aschoff Treating Tucker Minter/Extender: Skipper Cliche in Treatment: 6 Vital [name] Time Taken: 08:50 Temperature (F): 97.9 Height (in): 61.5 Pulse (bpm): 72 Weight (lbs): 178 Respiratory Rate (breaths/min): 18 Body Mass Index (BMI): 33.1 Blood Pressure (mmHg): 122/64 Reference Range: 80 - 120 mg / dl Electronic Signature(s) Signed: 06/27/2021 11:44:27 AM By: Enedina Finner RCP, RRT, CHT Entered By: Enedina Finner on 06/27/2021 09:01:58

## 2021-06-27 NOTE — Progress Notes (Signed)
RIGHTEOUS, BOUCHIE ([id number]) Visit Report for 06/26/2021 HBO Details Patient Name: Melissa [name], Melissa [name]. Date of Service: 06/26/2021 9:00 AM Medical Record Number: [id number] Patient Account Number: [id number] Date of Birth/Sex: 01-20-1946 (75 y.o. 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 HBO Treatment Details Treatment Number: 17 Patient Type: Outpatient Chamber Type: Monoplace Chamber Serial #: [id number] Treatment Protocol: 2.0 ATA with 90 minutes oxygen, with two 5 minute air breaks Treatment Details Compression Rate Down: 1.5 psi / minute De-Compression Rate Up: 1.5 psi / minute Compress Tx Pressure Air breaks and breathing periods Decompress Decompress Begins Reached (leave unused spaces blank) Begins Ends Chamber Pressure (ATA) '1 2 2 2 2 2 '$ --2 1 Clock Time (24 hr) 08:52 09:07 09:37 09:42 10:13 10:18 - - 10:48 11:03 Treatment Length: 131 (minutes) Treatment Segments: 4 Vital [name] Capillary Blood Glucose Reference Range: 80 - 120 mg / dl HBO Diabetic Blood Glucose Intervention Range: <131 mg/dl or >249 mg/dl Time Vitals Blood Respiratory Capillary Blood Glucose Pulse Action Type: Pulse: Temperature: Taken: Pressure: Rate: Glucose (mg/dl): Meter #: Oximetry (%) Taken: Pre 08:40 136/60 66 18 98 Post 11:07 140/70 72 18 98.5 Treatment Response Treatment Toleration: Well Treatment Completion Treatment Completed without Adverse Event Status: Electronic Signature(s) Signed: 06/26/2021 2:41:09 PM By: Enedina Finner RCP, RRT, CHT Signed: 06/26/2021 5:27:10 PM By: Worthy Keeler PA-C Entered By:  Stark Jock, Amado Nash on 06/26/2021 14:40:03 Melissa [name] ([id number]) -------------------------------------------------------------------------------- HBO Safety Checklist Details Patient Name: Melissa [name]. Date of Service: 06/26/2021 9:00 AM Medical Record Number: [id number] Patient Account Number: [id number] Date of Birth/Sex: 07-Aug-1946 (75 y.o. F) Treating RN: Dolan Amen Primary Care Maeson Purohit: Miguel Aschoff Other Clinician: Jacqulyn Bath Referring Klee Kolek: Miguel Aschoff Treating Dasani Crear/Extender: Skipper Cliche in Treatment: 6 HBO Safety Checklist Items Safety Checklist Consent Form Signed Patient voided / foley secured and emptied When did you last eato 07:00 am Last dose of injectable or oral agent na Ostomy pouch emptied and vented if applicable NA All implantable devices assessed, documented and approved NA Intravenous access site secured and place NA Valuables secured Linens and cotton and cotton/polyester blend (less than 51% polyester) Personal oil-based products / skin lotions / body lotions removed Wigs or hairpieces removed NA Smoking or tobacco materials removed NA Books / newspapers / magazines / loose paper removed NA Cologne, aftershave, perfume and deodorant removed Jewelry removed (may wrap wedding band) Make-up removed Hair care products removed Battery operated devices (external) removed NA Heating patches and chemical warmers removed NA Titanium eyewear removed NA Nail polish cured greater than 10 hours NA Casting material cured greater than 10 hours NA Hearing aids removed NA Loose dentures or partials removed NA Prosthetics have been removed NA Patient demonstrates correct use of air break device (if applicable) Patient concerns have been addressed Patient grounding bracelet on and cord attached to chamber Specifics for Inpatients (complete in addition to above) Medication sheet sent with  patient Intravenous medications needed or due during therapy sent with patient Drainage tubes (e.g. nasogastric tube or chest tube secured and vented) Endotracheal or Tracheotomy tube secured Cuff deflated of air and inflated with saline Airway suctioned Electronic Signature(s) Signed: 06/26/2021 2:41:09 PM By: Enedina Finner RCP, RRT, CHT Entered By: Juleen China,  Durene Fruits on 06/26/2021 09:25:59

## 2021-06-28 NOTE — Progress Notes (Signed)
Melissa [name], Melissa [name] ([id number]) Visit Report for 06/27/2021 HBO Details Patient Name: Melissa [name], Melissa [name]. Date of Service: 06/27/2021 9:00 AM Medical Record Number: [id number] Patient Account Number: [id number] Date of Birth/Sex: 09/07/1946 (75 y.o. 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 HBO Treatment Details Treatment Number: 18 Patient Type: Outpatient Chamber Type: Monoplace Chamber Serial #: [id number] Treatment Protocol: 2.0 ATA with 90 minutes oxygen, with two 5 minute air breaks Treatment Details Compression Rate Down: 1.5 psi / minute De-Compression Rate Up: 1.5 psi / minute Compress Tx Pressure Air breaks and breathing periods Decompress Decompress Begins Reached (leave unused spaces blank) Begins Ends Chamber Pressure (ATA) '1 2 2 2 2 2 '$ --2 1 Clock Time (24 hr) 08:59 09:15 09:45 09:50 10:21 10:26 - - 10:57 11:11 Treatment Length: 132 (minutes) Treatment Segments: 4 Vital [name] Capillary Blood Glucose Reference Range: 80 - 120 mg / dl HBO Diabetic Blood Glucose Intervention Range: <131 mg/dl or >249 mg/dl Time Vitals Blood Respiratory Capillary Blood Glucose Pulse Action Type: Pulse: Temperature: Taken: Pressure: Rate: Glucose (mg/dl): Meter #: Oximetry (%) Taken: Pre 08:50 122/64 72 18 97.9 Post 11:15 140/70 78 18 97.9 Treatment Response Treatment Toleration: Well Treatment Completion Treatment Completed without Adverse Event Status: Electronic Signature(s) Signed: 06/27/2021 11:44:27 AM By: Enedina Finner RCP, RRT, CHT Signed: 06/27/2021 6:45:55 PM By: Worthy Keeler PA-C Entered By:  Stark Jock, Amado Nash on 06/27/2021 11:43:17 Melissa [name] ([id number]) -------------------------------------------------------------------------------- HBO Safety Checklist Details Patient Name: Melissa [name]. Date of Service: 06/27/2021 9:00 AM Medical Record Number: [id number] Patient Account Number: [id number] Date of Birth/Sex: March 17, 1946 (75 y.o. F) Treating RN: Carlene Coria Primary Care Tanysha Quant: Miguel Aschoff Other Clinician: Jacqulyn Bath Referring Yadhira Mckneely: Miguel Aschoff Treating Vivianne Carles/Extender: Skipper Cliche in Treatment: 6 HBO Safety Checklist Items Safety Checklist Consent Form Signed Patient voided / foley secured and emptied When did you last eato 07:00 am Last dose of injectable or oral agent na Ostomy pouch emptied and vented if applicable NA All implantable devices assessed, documented and approved NA Intravenous access site secured and place NA Valuables secured Linens and cotton and cotton/polyester blend (less than 51% polyester) Personal oil-based products / skin lotions / body lotions removed Wigs or hairpieces removed NA Smoking or tobacco materials removed NA Books / newspapers / magazines / loose paper removed NA Cologne, aftershave, perfume and deodorant removed Jewelry removed (may wrap wedding band) Make-up removed Hair care products removed Battery operated devices (external) removed NA Heating patches and chemical warmers removed NA Titanium eyewear removed NA Nail polish cured greater than 10 hours NA Casting material cured greater than 10 hours NA Hearing aids removed NA Loose dentures or partials removed NA Prosthetics have been removed NA Patient demonstrates correct use of air break device (if applicable) Patient concerns have been addressed Patient grounding bracelet on and cord attached to chamber Specifics for Inpatients (complete in addition to above) Medication sheet sent with  patient Intravenous medications needed or due during therapy sent with patient Drainage tubes (e.g. nasogastric tube or chest tube secured and vented) Endotracheal or Tracheotomy tube secured Cuff deflated of air and inflated with saline Airway suctioned Electronic Signature(s) Signed: 06/27/2021 11:44:27 AM By: Stark Jock, Sallie RCP, RRT, CHT Entered By: Juleen China,  Durene Fruits on 06/27/2021 09:03:10

## 2021-06-30 ENCOUNTER — Encounter: Payer: PPO | Admitting: Physician Assistant

## 2021-06-30 ENCOUNTER — Other Ambulatory Visit: Payer: Self-pay

## 2021-06-30 DIAGNOSIS — L598 Other specified disorders of the skin and subcutaneous tissue related to radiation: Secondary | ICD-10-CM | POA: Diagnosis not present

## 2021-06-30 NOTE — Progress Notes (Addendum)
Melissa [name], Melissa [name] ([id number]) Visit Report for 06/30/2021 HBO Details Patient Name: Melissa [name], Melissa [name]. Date of Service: 06/30/2021 9:00 AM Medical Record Number: [id number] Patient Account Number: [id number] Date of Birth/Sex: 05-28-1946 (75 y.o. 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 HBO Treatment Details Treatment Number: 19 Patient Type: Outpatient Chamber Type: Monoplace Chamber Serial #: [id number] Treatment Protocol: 2.5 ATA with 90 minutes oxygen, with two 5 minute air breaks Treatment Details Compression Rate Down: 1.5 psi / minute De-Compression Rate Up: 1.5 psi / minute Compress Tx Pressure Air breaks and breathing periods Decompress Decompress Begins Reached (leave unused spaces blank) Begins Ends Chamber Pressure (ATA) 1 2.5 2.5 2.5 2.5 2.5 - - 2.5 1 Clock Time (24 hr) 08:55 09:15 09:41 09:46 10:17 10:22 - - 10:52 11:08 Treatment Length: 133 (minutes) Treatment Segments: 4 Vital Signs Capillary Blood Glucose Reference Range: 80 - 120 mg / dl HBO Diabetic Blood Glucose Intervention Range: <131 mg/dl or >249 mg/dl Time Vitals Blood Respiratory Capillary Blood Glucose Pulse Action Type: Pulse: Temperature: Taken: Pressure: Rate: Glucose (mg/dl): Meter #: Oximetry (%) Taken: Pre 08:45 148/62 84 18 98.6 Post 11:10 142/70 88 16 98.6 Treatment Response Treatment Toleration: Well Treatment Completion Treatment Completed without Adverse Event Status: Electronic Signature(s) Signed: 07/01/2021 9:44:49 AM By: Gretta Cool, BSN, RN, CWS, Kim RN, BSN Signed: 07/02/2021 5:04:06 PM By: Worthy Keeler PA-C Previous  Signature: 06/30/2021 11:15:35 AM Version By: Gretta Cool BSN, RN, CWS, Kim RN, BSN Previous Signature: 06/30/2021 4:44:39 PM Version By: Worthy Keeler PA-C Previous Signature: 06/30/2021 10:57:25 AM Version By: Gretta Cool BSN, RN, CWS, Kim RN, BSN Previous Signature: 06/30/2021 10:26:36 AM Version By: Gretta Cool, BSN, RN, CWS, Kim RN, BSN Previous Signature: 06/30/2021 9:47:46 AM Version By: Gretta Cool, BSN, RN, CWS, Kim RN, BSN Previous Signature: 06/30/2021 9:14:34 AM Version By: Gretta Cool, BSN, RN, CWS, Kim RN, BSN Entered By: Gretta Cool, BSN, RN, CWS, Kim on 07/01/2021 09:44:49 Melissa [name], Melissa [name] ([id number]) -------------------------------------------------------------------------------- HBO Safety Checklist Details Patient Name: Melissa [name], Melissa [name]. Date of Service: 06/30/2021 9:00 AM Medical Record Number: [id number] Patient Account Number: [id number] Date of Birth/Sex: [birth year] (75 y.o. F) Treating RN: Cornell Barman Primary Care Tayari Yankee: Miguel Aschoff Other Clinician: Cornell Barman Referring Gavyn Zoss: Miguel Aschoff Treating Zyara Riling/Extender: Skipper Cliche in Treatment: 6 HBO Safety Checklist Items Safety Checklist Consent Form Signed Patient voided / foley secured and emptied When did you last eato am Last dose of injectable or oral agent na Ostomy pouch emptied and vented if applicable NA All implantable devices assessed, documented and approved NA Intravenous access site secured and place NA Valuables secured Linens and cotton and cotton/polyester blend (less than 51% polyester) Personal oil-based products / skin lotions / body lotions removed Wigs or hairpieces removed NA Smoking or tobacco materials removed NA Books / newspapers / magazines / loose paper removed Cologne, aftershave, perfume and deodorant removed Jewelry removed (may wrap wedding band) Make-up removed Hair care products removed Battery operated devices (external) removed NA Heating patches and chemical warmers  removed NA Titanium eyewear removed NA Nail polish cured greater than 10 hours Casting material cured greater than 10 hours NA Hearing aids removed NA Loose dentures or partials removed NA Prosthetics have been removed NA Patient demonstrates correct use of air  break device (if applicable) Patient concerns have been addressed Patient grounding bracelet on and cord attached to chamber Specifics for Inpatients (complete in addition to above) Medication sheet sent with patient Intravenous medications needed or due during therapy sent with patient Drainage tubes (e.g. nasogastric tube or chest tube secured and vented) Endotracheal or Tracheotomy tube secured Cuff deflated of air and inflated with saline Airway suctioned Electronic Signature(s) Signed: 06/30/2021 9:13:28 AM By: Gretta Cool, BSN, RN, CWS, Kim RN, BSN Entered By: Gretta Cool, BSN, RN, CWS, Kim on 06/30/2021 [id number]

## 2021-06-30 NOTE — Progress Notes (Addendum)
Melissa [name], Melissa [name] ([id number]) Visit Report for 06/30/2021 Arrival Information Details Patient Name: Melissa [name], Melissa [name]. Date of Service: 06/30/2021 9:00 AM Medical Record Number: [id number] Patient Account Number: [id number] Date of Birth/Sex: [birth year] (75 y.o. F) Treating RN: Cornell Barman Primary Care Caiden Arteaga: Miguel Aschoff Other Clinician: Cornell Barman Referring Jovoni Borkenhagen: Miguel Aschoff Treating Markese Bloxham/Extender: Skipper Cliche in Treatment: 6 Visit Information History Since Last Visit Pain Present Now: No Patient Arrived: Ambulatory Arrival Time: 08:47 Accompanied By: self Transfer Assistance: None Patient Identification Verified: Yes Secondary Verification Process Completed: Yes Patient Has Alerts: Yes Patient Alerts: NOT diabetic Electronic Signature(s) Signed: 06/30/2021 8:47:35 AM By: Gretta Cool, BSN, RN, CWS, Kim RN, BSN Entered By: Gretta Cool, BSN, RN, CWS, Kim on 06/30/2021 08:47:35 Melissa [name] ([id number]) -------------------------------------------------------------------------------- Encounter Discharge Information Details Patient Name: Melissa [name]. Date of Service: 06/30/2021 9:00 AM Medical Record Number: [id number] Patient Account Number: [id number] Date of Birth/Sex: [birth year] (75 y.o. F) Treating RN: Cornell Barman Primary Care Koltyn Kelsay: Miguel Aschoff Other Clinician: Cornell Barman Referring Cha Gomillion: Miguel Aschoff Treating Jazzlynn Rawe/Extender: Skipper Cliche in Treatment: 6 Encounter Discharge Information Items Discharge Condition: Stable Ambulatory Status: Ambulatory Discharge Destination: Home Transportation: Private Auto Schedule Follow-up Appointment: Yes Clinical Summary of Care: Electronic Signature(s) Signed: 06/30/2021 11:19:05 AM By: Gretta Cool, BSN, RN, CWS, Kim RN, BSN Entered By: Gretta Cool, BSN, RN, CWS, Kim on 06/30/2021 11:19:04 Melissa [name]  ([id number]) -------------------------------------------------------------------------------- [place] Details Patient Name: Melissa [name] Date of Service: 06/30/2021 9:00 AM Medical Record Number: [id number] Patient Account Number: [id number] Date of Birth/Sex: Jun 17, 1946 (75 y.o. F) Treating RN: Cornell Barman Primary Care Candies Palm: Miguel Aschoff Other Clinician: Cornell Barman Referring Kyngston Pickelsimer: Miguel Aschoff Treating Stanislaus Kaltenbach/Extender: Skipper Cliche in Treatment: 6 Vital [name] Time Taken: 08:45 Temperature (F): 98.6 Height (in): 61.5 Pulse (bpm): 84 Weight (lbs): 178 Respiratory Rate (breaths/min): 18 Body Mass Index (BMI): 33.1 Blood Pressure (mmHg): 148/62 Reference Range: 80 - 120 mg / dl Electronic Signature(s) Signed: 06/30/2021 9:12:16 AM By: Gretta Cool, BSN, RN, CWS, Kim RN, BSN Entered By: Gretta Cool, BSN, RN, CWS, Kim on 06/30/2021 09:12:15

## 2021-07-01 ENCOUNTER — Encounter (INDEPENDENT_AMBULATORY_CARE_PROVIDER_SITE_OTHER): Payer: Self-pay | Admitting: Vascular Surgery

## 2021-07-01 ENCOUNTER — Encounter: Payer: PPO | Admitting: Physician Assistant

## 2021-07-01 ENCOUNTER — Ambulatory Visit (INDEPENDENT_AMBULATORY_CARE_PROVIDER_SITE_OTHER): Payer: PPO | Admitting: Vascular Surgery

## 2021-07-01 VITALS — BP 126/69 | HR 92 | Resp 16 | Ht 61.0 in | Wt 175.2 lb

## 2021-07-01 DIAGNOSIS — E782 Mixed hyperlipidemia: Secondary | ICD-10-CM

## 2021-07-01 DIAGNOSIS — L598 Other specified disorders of the skin and subcutaneous tissue related to radiation: Secondary | ICD-10-CM | POA: Diagnosis not present

## 2021-07-01 DIAGNOSIS — I773 Arterial fibromuscular dysplasia: Secondary | ICD-10-CM | POA: Diagnosis not present

## 2021-07-01 NOTE — Progress Notes (Signed)
Patient ID: Melissa [name], female   DOB: 10/14/1946, 75 y.o.   MRN: [id number]  Chief Complaint  Patient presents with   New Patient (Initial Visit)    Ref Ert abnormalities seen on carotid ultrasound    HPI Melissa [name] is a 75 y.o. female.  I am asked to see the patient by Dr. Rosanna Randy for evaluation of carotid stenosis.  The patient reports a pulsatile tinnitus sensation in the right ear.  No focal neurologic symptoms. Specifically, the patient denies amaurosis fugax, speech or swallowing difficulties, or arm or leg weakness or numbness.  The patient had a carotid duplex performed at the hospital which I have reviewed which is suggestive of fibromuscular dysplasia changes in the carotid arteries bilaterally with somewhat tortuous vessels and flow characteristics concerning for FMD.  There was no hemodynamically significant stenosis in either carotid artery and both vertebral arteries were antegrade.  She does not have significant blood pressure issues or history of renal insufficiency to her knowledge.   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  Chemotherapy and radiation.   Basal cell carcinoma 09/30/2020   upper back, [facility] 11/11/2020   Depression    Diffuse cystic mastopathy 12/30/12   Dry mouth    Fibroid    Fibromyalgia 2006   Hemorrhoids 2006   Hypothyroidism    Migraines 2006   Other nonspecific finding on examination of urine [id number]   Personal history of chemotherapy    skin, rectal   Personal history of radiation therapy    rectal   Sciatica    Tubular adenoma of colon 07/08/2016   Vulvitis 11/22/2015    Past Surgical History:  Procedure Laterality Date   ANAL MASS EXC  01/12/2003   Operative report described the nodular area of invasive squamous cell carcinoma anywhere from the 3-6 o'clock position multiple procedure notes   anal ulcer  04/2016    SQUAMOUS MUCOSA WITH ACUTE ACTIVE ULCER AND REACTIVE HYPERPLASIA   BREAST BIOPSY Bilateral    neg   BREAST SURGERY  2005   BREAST REDUCTION SX Breast Biopsy (left) Neg for cancer   CATARACT EXTRACTION     COLONOSCOPY  2008, 2013   COLONOSCOPY WITH PROPOFOL N/A 07/08/2016   tubular adenoma/ COLONOSCOPY WITH PROPOFOL;  Surgeon: Christene Lye, MD;  Location: [facility] ENDOSCOPY;  Service: Endoscopy;  Laterality: N/A;   COSMETIC SURGERY  20008   FACIAL   REDUCTION MAMMAPLASTY Bilateral 1998   TONSILLECTOMY  2005   VAGINAL HYSTERECTOMY  1986   Secondary to [place]    Family History  Problem Relation Age of Onset   Diabetes Father    Cancer Father    Stroke Sister    Heart disease Maternal Grandmother    Breast cancer Paternal Aunt    Ovarian cancer Neg Hx       Social History   Tobacco Use   Smoking status: Never   Smokeless tobacco: Never  Vaping Use   Vaping Use: Never used  Substance Use Topics   Alcohol use: No   Drug use: No     Allergies  Allergen Reactions   Pentazocine Lactate    Propoxyphene N-Acetaminophen    Talwin  [Pentazocine]    Tramadol Hcl    Ciprofloxacin Hcl Rash   Darvon  [Propoxyphene] Rash    Hallucinations Hallucinations.   Dicloxacillin Rash   Etodolac Rash  Current Outpatient Medications  Medication Sig Dispense Refill   acetaminophen (TYLENOL) 650 MG CR tablet Take 650 mg by mouth daily as needed for pain.     Bioflavonoid Products (BIOFLEX PO) Take by mouth daily.     buPROPion (WELLBUTRIN XL) 150 MG 24 hr tablet TAKE 1 TABLET BY MOUTH EVERY DAY 90 tablet 1   cholecalciferol (VITAMIN D) 1000 units tablet Take 5,000 Units by mouth daily.      cholestyramine (QUESTRAN) 4 g packet DISSOLVE 1 PACKET (4 G TOTAL) IN LIQUID AND TAKE BY MOUTH 2 (TWO) TIMES DAILY. 180 packet 1   clobetasol ointment (TEMOVATE) [id number] % Apply 1 application topically 2 (two) times daily. Twice a day for 14 days then daily for 4 weeks 30 g 3   diphenhydrAMINE  (BENADRYL) 25 MG tablet Take 25 mg by mouth every 6 (six) hours as needed for itching.      ibuprofen (ADVIL) 600 MG tablet Take 600 mg by mouth every 6 (six) hours as needed.     levothyroxine (SYNTHROID) 175 MCG tablet TAKE 1 TABLET BY MOUTH DAILY BEFORE BREAKFAST 90 tablet 3   loperamide (IMODIUM) 2 MG capsule Take 2 mg by mouth 4 (four) times daily as needed for diarrhea or loose stools.     Multiple Vitamins-Minerals (MULTIVITAL) tablet Take 1 tablet by mouth daily.     nabumetone (RELAFEN) 500 MG tablet TAKE 1 TABLET BY MOUTH TWICE A DAY AS NEEDED 180 tablet 0   Omega-3 Fatty Acids (FISH OIL) 1200 MG CAPS Take by mouth daily.      Probiotic Product (DIGESTIVE ADVANTAGE PO) Take by mouth daily.     sertraline (ZOLOFT) 100 MG tablet TAKE 1 TABLET BY MOUTH TWICE A DAY 180 tablet 1   Estradiol 10 MCG TABS vaginal tablet 1 tab per vagina daily for 2 weeks then twice weekly 35 tablet 1   OXYCODONE-ACETAMINOPHEN PO Take 1 tablet by mouth every 4 (four) hours as needed. 5 mg (Patient not taking: Reported on 07/01/2021)     No current facility-administered medications for this visit.      REVIEW OF SYSTEMS (Negative unless checked)  Constitutional: '[]'$ Weight loss  '[]'$ Fever  '[]'$ Chills Cardiac: '[]'$ Chest pain   '[]'$ Chest pressure   '[]'$ Palpitations   '[]'$ Shortness of breath when laying flat   '[]'$ Shortness of breath at rest   '[]'$ Shortness of breath with exertion. Vascular:  '[]'$ Pain in legs with walking   '[]'$ Pain in legs at rest   '[]'$ Pain in legs when laying flat   '[]'$ Claudication   '[]'$ Pain in feet when walking  '[]'$ Pain in feet at rest  '[]'$ Pain in feet when laying flat   '[]'$ History of DVT   '[]'$ Phlebitis   '[]'$ Swelling in legs   '[]'$ Varicose veins   '[]'$ Non-healing ulcers Pulmonary:   '[]'$ Uses home oxygen   '[]'$ Productive cough   '[]'$ Hemoptysis   '[]'$ Wheeze  '[]'$ COPD   '[]'$ Asthma Neurologic:  '[]'$ Dizziness  '[]'$ Blackouts   '[]'$ Seizures   '[]'$ History of stroke   '[]'$ History of TIA  '[]'$ Aphasia   '[]'$ Temporary blindness   '[]'$ Dysphagia   '[]'$ Weakness or  numbness in arms   '[]'$ Weakness or numbness in legs Musculoskeletal:  '[]'$ Arthritis   '[]'$ Joint swelling   '[]'$ Joint pain   '[]'$ Low back pain Hematologic:  '[]'$ Easy bruising  '[]'$ Easy bleeding   '[]'$ Hypercoagulable state   '[]'$ Anemic  '[]'$ Hepatitis Gastrointestinal:  '[]'$ Blood in stool   '[]'$ Vomiting blood  '[]'$ Gastroesophageal reflux/heartburn   '[]'$ Abdominal pain Genitourinary:  '[]'$ Chronic kidney disease   '[]'$ Difficult urination  '[]'$ Frequent urination  '[]'$ Burning with  urination   '[]'$ Hematuria Skin:  '[]'$ Rashes   '[]'$ Ulcers   '[]'$ Wounds Psychological:  '[x]'$ History of anxiety   '[x]'$  History of major depression.    Physical Exam BP 126/69 (BP Location: Right Arm)   Pulse 92   Resp 16   Ht '5\' 1"'$  (1.549 m)   Wt 175 lb 3.2 oz (79.5 kg)   BMI 33.10 kg/m  Gen:  WD/WN, NAD Head: [place]/AT, No temporalis wasting.  Ear/Nose/Throat: Hearing grossly intact, nares w/o erythema or drainage, oropharynx w/o Erythema/Exudate Eyes: Conjunctiva clear, sclera non-icteric  Neck: trachea midline.  No bruit or JVD.  Pulmonary:  Good air movement, clear to auscultation bilaterally.  Cardiac: RRR, normal S1, S2 Vascular:  Vessel Right Left  Radial Palpable Palpable               Musculoskeletal: M/S 5/5 throughout.  Extremities without ischemic changes.  No deformity or atrophy.  No edema. Neurologic: Sensation grossly intact in extremities.  Symmetrical.  Speech is fluent. Motor exam as listed above. Psychiatric: Judgment intact, Mood & affect appropriate for pt's clinical situation. Dermatologic: No rashes or ulcers noted.  No cellulitis or open wounds.    Radiology US Carotid Duplex Bilateral  Result Date: 06/11/2021 CLINICAL DATA:  75 year old female with carotid bruit. EXAM: BILATERAL CAROTID DUPLEX ULTRASOUND TECHNIQUE: Pearline Cables scale imaging, color Doppler and duplex ultrasound were performed of bilateral carotid and vertebral arteries in the neck. COMPARISON:  None. FINDINGS: Criteria: Quantification of carotid stenosis is based on  velocity parameters that correlate the residual internal carotid diameter with NASCET-based stenosis levels, using the diameter of the distal internal carotid lumen as the denominator for stenosis measurement. The following velocity measurements were obtained: RIGHT ICA: Peak systolic velocity 123XX123 cm/sec, End diastolic velocity 32 cm/sec CCA: Peak systolic velocity 63 cm/sec SYSTOLIC ICA/CCA RATIO:  1.9 ECA: Peak systolic velocity 86 cm/sec LEFT ICA: Peak systolic velocity [id number] cm/sec, End diastolic velocity 38 cm/sec CCA: 69 cm/sec SYSTOLIC ICA/CCA RATIO:  1.8 ECA: 69 cm/sec RIGHT CAROTID ARTERY: No atherosclerotic plaque formation. Tortuosity is noted in the mid to distal segments with associated increased flow velocity. Normal low resistance waveforms. RIGHT VERTEBRAL ARTERY:  Antegrade flow. LEFT CAROTID ARTERY: No atherosclerotic plaque formation. Tortuosity is noted in the mid to distal segments with associated increased flow velocity. Normal low resistance waveforms. LEFT VERTEBRAL ARTERY:  Antegrade flow. Upper extremity non-invasive blood pressures: IMPRESSION: 1. Right carotid artery system: Patent without significant atherosclerotic plaque formation. 2. Left carotid artery system: Patent without significant atherosclerotic plaque formation. 3. Tortuosity with associated increased flow velocity in the bilateral mid to distal internal carotid arteries as could be seen with fibromuscular dysplasia. 4.  Vertebral artery system: Patent with antegrade flow bilaterally. Ruthann Cancer, MD [facility] Electronically Signed   By: Ruthann Cancer MD   On: 06/11/2021 14:28    Labs No results found for this or any previous visit (from the past 2160 hour(s)).  Assessment/Plan:  Hyperlipidemia lipid control important in reducing the progression of atherosclerotic disease. Continue statin therapy   Fibromuscular dysplasia of both carotid arteries  (HCC) The patient had a carotid duplex performed at the hospital which I have reviewed which is suggestive of fibromuscular dysplasia changes in the carotid arteries bilaterally with somewhat tortuous vessels and flow characteristics concerning for FMD.  There was no hemodynamically significant stenosis in either carotid artery and both vertebral arteries were antegrade.   We had a long discussion today regarding the pathophysiology and natural  history of fibromuscular dysplasia.  She has likely had this for many years but had no significant worrisome symptoms from this.  It could be contributing to pulsatile tinnitus, but I would not treat it just for this reason.  Without significant stenosis, this can be monitored with serial duplex evaluations on an annual basis for now.  She does not have any significant renal dysfunction or severe hypertension that would warrant evaluation of her renal arteries at this time.      Leotis Pain 07/01/2021, 2:15 PM   This note was created with Dragon medical transcription system.  Any errors from dictation are unintentional.

## 2021-07-01 NOTE — Assessment & Plan Note (Signed)
lipid control important in reducing the progression of atherosclerotic disease. Continue statin therapy  

## 2021-07-01 NOTE — Progress Notes (Addendum)
Melissa [name] ([id number]) Visit Report for 07/01/2021 HBO Details Patient Name: Melissa [name], Melissa [name]. Date of Service: 07/01/2021 9:00 AM Medical Record Number: [id number] Patient Account Number: [id number] Date of Birth/Sex: 12-Mar-1946 (75 y.o. 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 HBO Treatment Details Treatment Number: 20 Patient Type: Outpatient Chamber Type: Monoplace Chamber Serial #: [id number] Treatment Protocol: 2.5 ATA with 90 minutes oxygen, with two 5 minute air breaks Treatment Details Compression Rate Down: 1.5 psi / minute De-Compression Rate Up: 1.5 psi / minute Compress Tx Pressure Air breaks and breathing periods Decompress Decompress Begins Reached (leave unused spaces blank) Begins Ends Chamber Pressure (ATA) 1 2.5 2.5 2.5 2.5 2.5 - - 2.5 1 Clock Time (24 hr) 08:55 09:10 09:40 09:45 10:16 10:21 - - 10:51 11:07 Treatment Length: 132 (minutes) Treatment Segments: 4 Vital Signs Capillary Blood Glucose Reference Range: 80 - 120 mg / dl HBO Diabetic Blood Glucose Intervention Range: <131 mg/dl or >249 mg/dl Time Vitals Blood Respiratory Capillary Blood Glucose Pulse Action Type: Pulse: Temperature: Taken: Pressure: Rate: Glucose (mg/dl): Meter #: Oximetry (%) Taken: Pre 08:45 150/60 88 16 98.2 Treatment Response Treatment Completion Status: Treatment Completed without Adverse Event Electronic Signature(s) Signed: 07/02/2021 5:46:14 PM By: Gretta Cool, BSN, RN, CWS, Kim RN, BSN Signed: 07/03/2021 4:44:10 PM By: Worthy Keeler PA-C Previous Signature: 07/01/2021 11:23:10 AM Version By: Gretta Cool BSN, RN, CWS,  Kim RN, BSN Previous Signature: 07/02/2021 5:04:06 PM Version By: Worthy Keeler PA-C Previous Signature: 07/01/2021 10:52:13 AM Version By: Gretta Cool, BSN, RN, CWS, Kim RN, BSN Previous Signature: 07/01/2021 10:22:07 AM Version By: Gretta Cool, BSN, RN, CWS, Kim RN, BSN Previous Signature: 07/01/2021 10:16:34 AM Version By: Gretta Cool, BSN, RN, CWS, Kim RN, BSN Previous Signature: 07/01/2021 9:46:27 AM Version By: Gretta Cool, BSN, RN, CWS, Kim RN, BSN Previous Signature: 07/01/2021 9:44:09 AM Version By: Gretta Cool, BSN, RN, CWS, Kim RN, BSN Previous Signature: 07/01/2021 9:10:49 AM Version By: Gretta Cool BSN, RN, CWS, Kim RN, BSN Previous Signature: 07/01/2021 9:01:14 AM Version By: Gretta Cool, BSN, RN, CWS, Kim RN, BSN Entered By: Gretta Cool, BSN, RN, CWS, Kim on 07/02/2021 17:46:14 [name] ([id number]) -------------------------------------------------------------------------------- HBO Safety Checklist Details Patient Name: Melissa [name]. Date of Service: 07/01/2021 9:00 AM Medical Record Number: [id number] Patient Account Number: [id number] Date of Birth/Sex: Apr 08, 1946 (75 y.o. F) Treating RN: Cornell Barman Primary Care Lusero Nordlund: Miguel Aschoff Other Clinician: Cornell Barman Referring Gracen Southwell: Miguel Aschoff Treating Fareeda Downard/Extender: Skipper Cliche in Treatment: 6 HBO Safety Checklist Items Safety Checklist Consent Form Signed Patient voided / foley secured and emptied When did you last eato am Last dose of injectable or oral agent NA Ostomy pouch emptied and vented if applicable NA All implantable devices assessed, documented and approved NA Intravenous access site secured and place NA Valuables secured Linens and cotton and cotton/polyester blend (less than 51% polyester) Personal oil-based products / skin lotions / body lotions removed Wigs or hairpieces removed NA Smoking or tobacco materials removed NA Books / newspapers / magazines / loose paper removed Cologne, aftershave, perfume and  deodorant removed Jewelry removed (may wrap wedding band) Make-up removed Hair care products removed Battery operated devices (external) removed NA Heating patches and chemical warmers removed NA Titanium eyewear removed NA Nail polish cured  greater than 10 hours Casting material cured greater than 10 hours NA Hearing aids removed NA Loose dentures or partials removed NA Prosthetics have been removed NA Patient demonstrates correct use of air break device (if applicable) Patient concerns have been addressed Patient grounding bracelet on and cord attached to chamber Specifics for Inpatients (complete in addition to above) Medication sheet sent with patient Intravenous medications needed or due during therapy sent with patient Drainage tubes (e.g. nasogastric tube or chest tube secured and vented) Endotracheal or Tracheotomy tube secured Cuff deflated of air and inflated with saline Airway suctioned Electronic Signature(s) Signed: 07/01/2021 9:00:01 AM By: Gretta Cool, BSN, RN, CWS, Kim RN, BSN Entered By: Gretta Cool, BSN, RN, CWS, Kim on 07/01/2021 09:00:00

## 2021-07-01 NOTE — Progress Notes (Addendum)
HAISLEE, EICHENBERG ([id number]) Visit Report for 07/01/2021 Arrival Information Details Patient Name: Melissa [name], Melissa [name]. Date of Service: 07/01/2021 9:00 AM Medical Record Number: [id number] Patient Account Number: [id number] Date of Birth/Sex: [birth year] (75 y.o. F) Treating RN: Cornell Barman Primary Care Breven Guidroz: Miguel Aschoff Other Clinician: Cornell Barman Referring Taleeyah Bora: Miguel Aschoff Treating Michon Kaczmarek/Extender: Skipper Cliche in Treatment: 6 Visit Information History Since Last Visit Pain Present Now: Yes Patient Arrived: Ambulatory Arrival Time: 08:40 Accompanied By: self Transfer Assistance: None Patient Identification Verified: Yes Secondary Verification Process Completed: Yes Patient Has Alerts: Yes Patient Alerts: NOT diabetic Electronic Signature(s) Signed: 07/01/2021 8:58:18 AM By: Gretta Cool, BSN, RN, CWS, Kim RN, BSN Entered By: Gretta Cool, BSN, RN, CWS, Kim on 07/01/2021 08:58:18 [name] ([id number]) -------------------------------------------------------------------------------- Encounter Discharge Information Details Patient Name: [name]. Date of Service: 07/01/2021 9:00 AM Medical Record Number: [id number] Patient Account Number: [id number] Date of Birth/Sex: [birth year] (75 y.o. F) Treating RN: Cornell Barman Primary Care Mikhia Dusek: Miguel Aschoff Other Clinician: Cornell Barman Referring Jaaliyah Lucatero: Miguel Aschoff Treating Rachelle Edwards/Extender: Skipper Cliche in Treatment: 6 Encounter Discharge Information Items Discharge Condition: Stable Ambulatory Status: Ambulatory Discharge Destination: Home Transportation: Private Auto Schedule Follow-up Appointment: Yes Clinical Summary of Care: Electronic Signature(s) Signed: 07/02/2021 5:47:26 PM By: Gretta Cool, BSN, RN, CWS, Kim RN, BSN Entered By: Gretta Cool, BSN, RN, CWS, Kim on 07/02/2021 17:47:26 [name]  ([id number]) -------------------------------------------------------------------------------- Pain Assessment Details Patient Name: [name] Date of Service: 07/01/2021 9:00 AM Medical Record Number: [id number] Patient Account Number: [id number] Date of Birth/Sex: 03/25/1946 (75 y.o. F) Treating RN: Cornell Barman Primary Care Lexington Devine: Miguel Aschoff Other Clinician: Cornell Barman Referring Robbi Spells: Miguel Aschoff Treating Alferd Obryant/Extender: Skipper Cliche in Treatment: 6 Active Problems Location of Pain Severity and Description of Pain Patient Has Paino Yes Site Locations Pain Location: Generalized Pain Pain Management and Medication Current Pain Management: Notes Patient has chronic pain. Electronic Signature(s) Signed: 07/01/2021 8:58:52 AM By: Gretta Cool, BSN, RN, CWS, Kim RN, BSN Entered By: Gretta Cool, BSN, RN, CWS, Kim on 07/01/2021 08:58:51 [name] ([id number]) -------------------------------------------------------------------------------- [place] Details Patient Name: [name] Date of Service: 07/01/2021 9:00 AM Medical Record Number: [id number] Patient Account Number: [id number] Date of Birth/Sex: October 02, 1946 (75 y.o. F) Treating RN: Cornell Barman Primary Care Stefan Karen: Miguel Aschoff Other Clinician: Cornell Barman Referring Deryl Giroux: Miguel Aschoff Treating Jathen Sudano/Extender: Skipper Cliche in Treatment: 6 Vital Signs Time Taken: 08:45 Temperature (F): 98.2 Height (in): 61.5 Pulse (bpm): 88 Weight (lbs): 178 Respiratory Rate (breaths/min): 16 Body Mass Index (BMI): 33.1 Blood Pressure (mmHg): 150/60 Reference Range: 80 - 120 mg / dl Electronic Signature(s) Signed: 07/01/2021 8:59:17 AM By: Gretta Cool, BSN, RN, CWS, Kim RN, BSN Entered By: Gretta Cool, BSN, RN, CWS, Kim on 07/01/2021 08:59:17

## 2021-07-01 NOTE — Assessment & Plan Note (Signed)
The patient had a carotid duplex performed at the hospital which I have reviewed which is suggestive of fibromuscular dysplasia changes in the carotid arteries bilaterally with somewhat tortuous vessels and flow characteristics concerning for FMD.  There was no hemodynamically significant stenosis in either carotid artery and both vertebral arteries were antegrade.   We had a long discussion today regarding the pathophysiology and natural history of fibromuscular dysplasia.  She has likely had this for many years but had no significant worrisome symptoms from this.  It could be contributing to pulsatile tinnitus, but I would not treat it just for this reason.  Without significant stenosis, this can be monitored with serial duplex evaluations on an annual basis for now.  She does not have any significant renal dysfunction or severe hypertension that would warrant evaluation of her renal arteries at this time.

## 2021-07-02 ENCOUNTER — Encounter (HOSPITAL_BASED_OUTPATIENT_CLINIC_OR_DEPARTMENT_OTHER): Payer: PPO | Admitting: Internal Medicine

## 2021-07-02 ENCOUNTER — Other Ambulatory Visit: Payer: Self-pay

## 2021-07-02 DIAGNOSIS — L598 Other specified disorders of the skin and subcutaneous tissue related to radiation: Secondary | ICD-10-CM | POA: Diagnosis not present

## 2021-07-02 DIAGNOSIS — Z85048 Personal history of other malignant neoplasm of rectum, rectosigmoid junction, and anus: Secondary | ICD-10-CM | POA: Diagnosis not present

## 2021-07-02 NOTE — Progress Notes (Signed)
[name], [name] ([id number]) Visit Report for 07/02/2021 Physician Orders Details Patient Name: Melissa [name], Melissa [name]. Date of Service: 07/02/2021 9:00 AM Medical Record Number: [id number] Patient Account Number: [id number] Date of Birth/Sex: 12-15-1945 (75 y.o. F) Treating RN: Cornell Barman Primary Care Provider: Miguel Aschoff Other Clinician: Cornell Barman Referring Provider: Miguel Aschoff Treating Provider/Extender: Yaakov Guthrie in Treatment: 6 Verbal / Phone Orders: No Diagnosis Coding Hyperbaric Oxygen Therapy o Indication and location: - radiation necrosis of perineal o If appropriate for treatment, begin HBOT per protocol: o 2.5 ATA for 90 Minutes with 2 Five (5) Minute Air Breaks o One treatment per day (delivered Monday through Friday unless otherwise specified in Special Instructions below): o Total # of Treatments: - 40 Electronic Signature(s) Signed: 07/02/2021 12:21:18 PM By: Gretta Cool, BSN, RN, CWS, Kim RN, BSN Signed: 07/02/2021 12:29:20 PM By: Kalman Shan DO Entered By: Gretta Cool, BSN, RN, CWS, Kim on 07/02/2021 12:21:17 [name] ([id number]) -------------------------------------------------------------------------------- [place] Details Patient Name: [name]. Date of Service: 07/02/2021 Medical Record Number: [id number] Patient Account Number: [id number] Date of Birth/Sex: [birth year] (75 y.o. F) Treating RN: Cornell Barman Primary Care Provider: Miguel Aschoff Other Clinician: Cornell Barman Referring Provider: Miguel Aschoff Treating Provider/Extender: Yaakov Guthrie in Treatment: 6 Diagnosis Coding ICD-10 Codes Code Description L59.8 Other specified disorders of the skin and subcutaneous tissue related to radiation Z85.048 Personal history of other malignant neoplasm of rectum, rectosigmoid junction, and anus Facility Procedures CPT4 Code: [id number] Description: (Facility Use Only) HBOT, full body chamber,  31mn Modifier: Quantity: 4 Physician Procedures CPT4 Code: [id number]: [id number] PHYS HYPERBARIC OXYGEN THERAPY Modifier: Quantity: 1 CPT4 Code: Description: ICD-10 Diagnosis Description L59.8 Other specified disorders of the skin and subcutaneous tissue related to Z85.048 Personal history of other malignant neoplasm of rectum, rectosigmoid junc Modifier: radiation tion, and anus Quantity: Electronic Signature(s) Signed: 07/02/2021 12:20:24 PM By: WGretta Cool BSN, RN, CWS, Kim RN, BSN Signed: 07/02/2021 12:29:20 PM By: HKalman ShanDO Entered By: WGretta Cool BSN, RN, CWS, Kim on 07/02/2021 12:20:23

## 2021-07-02 NOTE — Progress Notes (Addendum)
[name], [name] ([id number]) Visit Report for 07/02/2021 Arrival Information Details Patient Name: [name], [name]. Date of Service: 07/02/2021 9:00 AM Medical Record Number: [id number] Patient Account Number: [id number] Date of Birth/Sex: 1945/12/22 (75 y.o. F) Treating RN: Cornell Barman Primary Care Arabel Barcenas: Miguel Aschoff Other Clinician: Cornell Barman Referring Virlan Kempker: Miguel Aschoff Treating Avraj Lindroth/Extender: Yaakov Guthrie in Treatment: 6 Visit Information History Since Last Visit Pain Present Now: Yes Patient Arrived: Ambulatory Arrival Time: 08:49 Accompanied By: self Transfer Assistance: None Patient Identification Verified: Yes Secondary Verification Process Completed: Yes Patient Has Alerts: Yes Patient Alerts: NOT diabetic Electronic Signature(s) Signed: 07/02/2021 9:00:03 AM By: Gretta Cool, BSN, RN, CWS, Kim RN, BSN Entered By: Gretta Cool, BSN, RN, CWS, Kim on 07/02/2021 09:00:03 Melissa [name] ([id number]) -------------------------------------------------------------------------------- Encounter Discharge Information Details Patient Name: Melissa [name]. Date of Service: 07/02/2021 9:00 AM Medical Record Number: [id number] Patient Account Number: [id number] Date of Birth/Sex: 06-26-1946 (75 y.o. F) Treating RN: Cornell Barman Primary Care Isamar Wellbrock: Miguel Aschoff Other Clinician: Cornell Barman Referring Julias Mould: Miguel Aschoff Treating Tyre Beaver/Extender: Yaakov Guthrie in Treatment: 6 Encounter Discharge Information Items Discharge Condition: Stable Ambulatory Status: Ambulatory Discharge Destination: Home Transportation: Private Auto Schedule Follow-up Appointment: Yes Clinical Summary of Care: Electronic Signature(s) Signed: 07/02/2021 12:21:30 PM By: Gretta Cool, BSN, RN, CWS, Kim RN, BSN Entered By: Gretta Cool, BSN, RN, CWS, Kim on 07/02/2021 12:21:29 Melissa [name]  ([id number]) -------------------------------------------------------------------------------- Pain Assessment Details Patient Name: Melissa [name] Date of Service: 07/02/2021 9:00 AM Medical Record Number: [id number] Patient Account Number: [id number] Date of Birth/Sex: September 02, 1946 (75 y.o. F) Treating RN: Cornell Barman Primary Care Reino Lybbert: Miguel Aschoff Other Clinician: Cornell Barman Referring Giannamarie Paulus: Miguel Aschoff Treating Blondie Riggsbee/Extender: Yaakov Guthrie in Treatment: 6 Active Problems Location of Pain Severity and Description of Pain Patient Has Paino Yes Site Locations Pain Location: Generalized Pain Pain Management and Medication Current Pain Management: Notes Patient has chronic pain. Electronic Signature(s) Signed: 07/02/2021 12:21:56 PM By: Gretta Cool, BSN, RN, CWS, Kim RN, BSN Entered By: Gretta Cool, BSN, RN, CWS, Kim on 07/02/2021 12:21:56 Melissa [name] ([id number]) -------------------------------------------------------------------------------- Watson Details Patient Name: Melissa [name] Date of Service: 07/02/2021 9:00 AM Medical Record Number: [id number] Patient Account Number: [id number] Date of Birth/Sex: [birth year] (75 y.o. F) Treating RN: Cornell Barman Primary Care Vandy Tsuchiya: Miguel Aschoff Other Clinician: Cornell Barman Referring Caidon Foti: Miguel Aschoff Treating Nayana Lenig/Extender: Yaakov Guthrie in Treatment: 6 Vital [name] Time Taken: 08:50 Temperature (F): 98.3 Height (in): 61.5 Pulse (bpm): 78 Weight (lbs): 178 Respiratory Rate (breaths/min): 16 Body Mass Index (BMI): 33.1 Blood Pressure (mmHg): 147/60 Reference Range: 80 - 120 mg / dl Electronic Signature(s) Signed: 07/02/2021 9:00:25 AM By: Gretta Cool, BSN, RN, CWS, Kim RN, BSN Entered By: Gretta Cool, BSN, RN, CWS, Kim on 07/02/2021 09:00:25

## 2021-07-02 NOTE — Progress Notes (Addendum)
TKIA, BOLERJACK ([id number]) Visit Report for 07/02/2021 HBO Details Patient Name: Melissa [name], Melissa [name]. Date of Service: 07/02/2021 9:00 AM Medical Record Number: [id number] Patient Account Number: [id number] Date of Birth/Sex: 08-03-1946 (75 y.o. 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 HBO Treatment Details Treatment Number: 21 Patient Type: Outpatient Chamber Type: Monoplace Chamber Serial #: [id number] Treatment Protocol: 2.5 ATA with 90 minutes oxygen, with two 5 minute air breaks Treatment Details Compression Rate Down: 1.5 psi / minute De-Compression Rate Up: 1.5 psi / minute Compress Tx Pressure Air breaks and breathing periods Decompress Decompress Begins Reached (leave unused spaces blank) Begins Ends Chamber Pressure (ATA) 1 2.5 2.5 2.5 2.5 2.5 - - 2.5 1 Clock Time (24 hr) 08:59 09:14 09:44 09:49 10:19 10:24 - - 10:56 11:12 Treatment Length: 133 (minutes) Treatment Segments: 4 Vital Signs Capillary Blood Glucose Reference Range: 80 - 120 mg / dl HBO Diabetic Blood Glucose Intervention Range: <131 mg/dl or >249 mg/dl Time Vitals Blood Respiratory Capillary Blood Glucose Pulse Action Type: Pulse: Temperature: Taken: Pressure: Rate: Glucose (mg/dl): Meter #: Oximetry (%) Taken: Pre 08:50 147/60 78 16 98.3 Post 11:15 142/72 80 16 98.5 Treatment Response Treatment Toleration: Well Treatment Completion Treatment Completed without Adverse Event Status: HBO Attestation I certify that I supervised this HBO treatment in accordance with Medicare guidelines. A trained emergency response team is readily  Yes available per hospital policies and procedures. Continue HBOT as ordered. Yes Electronic Signature(s) Signed: 07/02/2021 12:29:20 PM By: Kalman Shan DO Previous Signature: 07/02/2021 12:20:11 PM Version By: Gretta Cool, BSN, RN, CWS, Kim RN, BSN Previous Signature: 07/02/2021 11:21:10 AM Version By: Gretta Cool, BSN, RN, CWS, Kim RN, BSN Previous Signature: 07/02/2021 11:11:54 AM Version By: Gretta Cool, BSN, RN, CWS, Kim RN, BSN Previous Signature: 07/02/2021 10:25:26 AM Version By: Gretta Cool, BSN, RN, CWS, Kim RN, BSN Previous Signature: 07/02/2021 10:24:12 AM Version By: Gretta Cool, BSN, RN, CWS, Kim RN, BSN Previous Signature: 07/02/2021 9:46:29 AM Version By: Gretta Cool BSN, RN, CWS, Kim RN, BSN Previous Signature: 07/02/2021 9:24:19 AM Version By: Gretta Cool BSN, RN, CWS, Kim RN, BSN Previous Signature: 07/02/2021 9:02:08 AM Version By: Gretta Cool, BSN, RN, CWS, Kim RN, BSN Entered By: Kalman Shan on 07/02/2021 12:28:55 [name] ([id number]) -------------------------------------------------------------------------------- HBO Safety Checklist Details Patient Name: Melissa, [name]. Date of Service: 07/02/2021 9:00 AM Medical Record Number: [id number] Patient Account Number: [id number] Date of Birth/Sex: 12-Nov-1946 (75 y.o. F) Treating RN: Cornell Barman Primary Care Zacchaeus Halm: Miguel Aschoff Other Clinician: Cornell Barman Referring Louay Myrie: Miguel Aschoff Treating Dariush Mcnellis/Extender: Yaakov Guthrie in Treatment: 6 HBO Safety Checklist Items Safety Checklist Consent Form Signed Patient voided / foley secured and emptied When did you last eato am Last dose of injectable or oral agent na Ostomy pouch emptied and vented if applicable NA All implantable devices assessed, documented and approved NA Intravenous access site secured and place NA Valuables secured NA Linens and cotton and cotton/polyester blend (less than 51% polyester) Personal oil-based products / skin lotions / body lotions  removed Wigs or hairpieces removed Smoking or tobacco materials removed NA Books / newspapers / magazines / loose paper removed Cologne, aftershave, perfume and deodorant removed Jewelry removed (may wrap wedding band) Make-up removed Hair care products removed Battery operated devices (external)  removed NA Heating patches and chemical warmers removed NA Titanium eyewear removed NA Nail polish cured greater than 10 hours Casting material cured greater than 10 hours NA Hearing aids removed NA Loose dentures or partials removed NA Prosthetics have been removed NA Patient demonstrates correct use of air break device (if applicable) Patient concerns have been addressed Patient grounding bracelet on and cord attached to chamber Specifics for Inpatients (complete in addition to above) Medication sheet sent with patient Intravenous medications needed or due during therapy sent with patient Drainage tubes (e.g. nasogastric tube or chest tube secured and vented) Endotracheal or Tracheotomy tube secured Cuff deflated of air and inflated with saline Airway suctioned Electronic Signature(s) Signed: 07/02/2021 9:01:38 AM By: Gretta Cool, BSN, RN, CWS, Kim RN, BSN Entered By: Gretta Cool, BSN, RN, CWS, Kim on 07/02/2021 09:01:38

## 2021-07-03 ENCOUNTER — Encounter: Payer: PPO | Admitting: Physician Assistant

## 2021-07-03 DIAGNOSIS — L598 Other specified disorders of the skin and subcutaneous tissue related to radiation: Secondary | ICD-10-CM | POA: Diagnosis not present

## 2021-07-03 NOTE — Progress Notes (Signed)
JLYN, BRAVE ([id number]) Visit Report for 07/03/2021 Problem List Details Patient Name: Melissa [name], Melissa [name]. Date of Service: 07/03/2021 9:00 AM Medical Record Number: [id number] Patient Account Number: [id number] Date of Birth/Sex: [birth year] (75 y.o. 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, rectosigmoid junction, and anus Facility Procedures CPT4 Code: [id number] Description: (Facility Use Only) HBOT, full body chamber, 31mn Modifier: Quantity: 4 Physician Procedures CPT4 Code: [id number]: [id number] PHYS HYPERBARIC OXYGEN THERAPY Modifier: Quantity:  1 CPT4 Code: Description: ICD-10 Diagnosis Description L59.8 Other specified disorders of the skin and subcutaneous tissue related to Z85.048 Personal history of other malignant neoplasm of rectum, rectosigmoid junc Modifier: radiation tion, and anus Quantity: Electronic Signature(s) Signed: 07/03/2021 4:42:15 PM By: SWorthy KeelerPA-C Previous Signature: 07/03/2021 11:38:39 AM Version By: WEnedina FinnerRCP, RRT, CHT Entered By: SWorthy Keeleron 07/03/2021 16:42:15

## 2021-07-03 NOTE — Progress Notes (Signed)
Melissa [name] ([id number]) Visit Report for 07/03/2021 HBO Details Patient Name: Melissa [name], Melissa [name]. Date of Service: 07/03/2021 9:00 AM Medical Record Number: [id number] Patient Account Number: [id number] Date of Birth/Sex: 03-May-1946 (75 y.o. 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 HBO Treatment Details Treatment Number: 22 Patient Type: Outpatient Chamber Type: Monoplace Chamber Serial #: [id number] Treatment Protocol: 2.5 ATA with 90 minutes oxygen, with two 5 minute air breaks Treatment Details Compression Rate Down: 1.5 psi / minute De-Compression Rate Up: 1.5 psi / minute Compress Tx Pressure Air breaks and breathing periods Decompress Decompress Begins Reached (leave unused spaces blank) Begins Ends Chamber Pressure (ATA) 1 2.5 2.5 2.5 2.5 2.5 - - 2.5 1 Clock Time (24 hr) 08:55 09:10 09:40 09:45 10:16 10:21 - - 10:51 11:06 Treatment Length: 131 (minutes) Treatment Segments: 4 Vital [name] Capillary Blood Glucose Reference Range: 80 - 120 mg / dl HBO Diabetic Blood Glucose Intervention Range: <131 mg/dl or >249 mg/dl Time Vitals Blood Respiratory Capillary Blood Glucose Pulse Action Type: Pulse: Temperature: Taken: Pressure: Rate: Glucose (mg/dl): Meter #: Oximetry (%) Taken: Pre 08:45 144/62 78 16 98.2 Post 11:10 124/68 66 16 98.5 Treatment Response Treatment Toleration: Well Treatment Completion Treatment Completed without Adverse Event Status: HBO Attestation I certify that I supervised this HBO treatment in accordance with Medicare guidelines. A trained emergency response team is readily  Yes available per hospital policies and procedures. Continue HBOT as ordered. Yes Electronic Signature(s) Signed: 07/03/2021 4:42:10 PM By: Worthy Keeler PA-C Previous Signature: 07/03/2021 11:38:39 AM Version By: Enedina Finner RCP, RRT, CHT Entered By: Worthy Keeler on 07/03/2021 16:42:09 Melissa [name] ([id number]) -------------------------------------------------------------------------------- HBO Safety Checklist Details Patient Name: Melissa [name] Date of Service: 07/03/2021 9:00 AM Medical Record Number: [id number] Patient Account Number: [id number] Date of Birth/Sex: [birth year] (75 y.o. F) Treating RN: Dolan Amen Primary Care Elmer Merwin: Miguel Aschoff Other Clinician: Jacqulyn Bath Referring Levell Tavano: Miguel Aschoff Treating Tania Perrott/Extender: Skipper Cliche in Treatment: 7 HBO Safety Checklist Items Safety Checklist Consent Form Signed Patient voided / foley secured and emptied When did you last eato 07:00 am Last dose of injectable or oral agent na Ostomy pouch emptied and vented if applicable NA All implantable devices assessed, documented and approved NA Intravenous access site secured and place NA Valuables secured Linens and cotton and cotton/polyester blend (less than 51% polyester) Personal oil-based products / skin lotions / body lotions removed Wigs or hairpieces removed NA Smoking or tobacco materials removed NA Books / newspapers / magazines / loose paper removed NA Cologne, aftershave, perfume and deodorant removed Jewelry removed (may wrap wedding band) Make-up removed Hair care products removed Battery operated devices (external) removed NA Heating patches and chemical warmers removed NA Titanium eyewear removed NA Nail polish cured greater than 10 hours NA Casting material cured greater than 10 hours NA Hearing aids removed NA Loose dentures or partials removed NA Prosthetics have been  removed NA Patient demonstrates correct use of air break device (if applicable) Patient concerns have been addressed Patient grounding bracelet on and cord attached to chamber Specifics for Inpatients (complete in addition to above) Medication sheet sent with patient Intravenous medications needed or due during therapy sent with patient Drainage tubes (e.g. nasogastric  tube or chest tube secured and vented) Endotracheal or Tracheotomy tube secured Cuff deflated of air and inflated with saline Airway suctioned Electronic Signature(s) Signed: 07/03/2021 11:38:39 AM By: Enedina Finner RCP, RRT, CHT Entered By: Enedina Finner on 07/03/2021 09:05:39

## 2021-07-03 NOTE — Progress Notes (Signed)
KARYNE, CUNNIFF ([id number]) Visit Report for 07/01/2021 Physician Orders Details Patient Name: SHOLANDA, BAGNASCO. Date of Service: 07/01/2021 9:00 AM Medical Record Number: [id number] Patient Account Number: [id number] Date of Birth/Sex: 09/24/1946 (75 y.o. F) Treating RN: Cornell Barman Primary Care Provider: Miguel Aschoff Other Clinician: Cornell Barman Referring Provider: Miguel Aschoff Treating Provider/Extender: Skipper Cliche in Treatment: 6 Verbal / Phone Orders: No Diagnosis Coding Hyperbaric Oxygen Therapy o Evaluate for HBO Therapy o Indication and location: - radiation necrosis of perineal o If appropriate for treatment, begin HBOT per protocol: o 2.5 ATA for 90 Minutes with 2 Five (5) Minute Air Breaks o One treatment per day (delivered Monday through Friday unless otherwise specified in Special Instructions below): o Total # of Treatments: - 40 Electronic Signature(s) Signed: 07/02/2021 5:47:14 PM By: Gretta Cool, BSN, RN, CWS, Kim RN, BSN Signed: 07/03/2021 4:44:10 PM By: Worthy Keeler PA-C Entered By: Gretta Cool, BSN, RN, CWS, Kim on 07/02/2021 17:47:14 [name] ([id number]) -------------------------------------------------------------------------------- SuperBill Details Patient Name: [name]. Date of Service: 07/01/2021 Medical Record Number: [id number] Patient Account Number: [id number] Date of Birth/Sex: 04-Dec-1946 (75 y.o. 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, rectosigmoid junction, and anus Facility Procedures CPT4 Code: [id number] Description: (Facility Use Only) HBOT, full body  chamber, 40mn Modifier: Quantity: 4 Physician Procedures CPT4 Code: [id number]: [id number] PHYS HYPERBARIC OXYGEN THERAPY Modifier: Quantity: 1 CPT4 Code: Description: ICD-10 Diagnosis Description L59.8 Other specified disorders of the skin and subcutaneous tissue related to Z85.048 Personal history of other malignant neoplasm of rectum, rectosigmoid junc Modifier: radiation tion, and anus Quantity: Electronic Signature(s) Signed: 07/02/2021 5:46:28 PM By: WGretta Cool BSN, RN, CWS, Kim RN, BSN Signed: 07/03/2021 4:44:10 PM By: SWorthy KeelerPA-C Entered By: WGretta Cool BSN, RN, CWS, Kim on 07/02/2021 17:46:28

## 2021-07-03 NOTE — Progress Notes (Signed)
Melissa [name], Melissa [name] ([id number]) Visit Report for 07/03/2021 Arrival Information Details Patient Name: Melissa [name], Melissa [name]. Date of Service: 07/03/2021 9:00 AM Medical Record Number: [id number] Patient Account Number: [id number] Date of Birth/Sex: 03-23-1946 (75 y.o. F) Treating RN: Dolan Amen Primary Care Finnley Lewis: Miguel Aschoff Other Clinician: Jacqulyn Bath Referring Chaske Paskett: Miguel Aschoff Treating Beverley Allender/Extender: Skipper Cliche in Treatment: 7 Visit Information History Since Last Visit Added or deleted any medications: No Patient Arrived: Ambulatory Any new allergies or adverse reactions: No Arrival Time: 08:40 Had a fall or experienced change in No Accompanied By: self activities of daily living that may affect Transfer Assistance: None risk of falls: Patient Identification Verified: Yes [name] or symptoms of abuse/neglect since last visito No Patient Has Alerts: Yes Hospitalized since last visit: No Patient Alerts: NOT diabetic Implantable device outside of the clinic excluding No cellular tissue based products placed in the center since last visit: Pain Present Now: No Electronic Signature(s) Signed: 07/03/2021 11:38:39 AM By: Enedina Finner RCP, RRT, CHT Entered By: Enedina Finner on 07/03/2021 09:02:08 Melissa [name] ([id number]) -------------------------------------------------------------------------------- Encounter Discharge Information Details Patient Name: Melissa [name] Date of Service: 07/03/2021 9:00 AM Medical Record Number: [id number] Patient Account Number: [id number] Date of Birth/Sex: [birth year] (75 y.o. F) Treating RN: Dolan Amen Primary Care Zakkary Thibault: Miguel Aschoff Other Clinician: Jacqulyn Bath Referring Tricha Ruggirello: Miguel Aschoff Treating Bailee Metter/Extender: Skipper Cliche in Treatment: 7 Encounter Discharge Information Items Discharge Condition: Stable Ambulatory Status:  Ambulatory Discharge Destination: Home Transportation: Private Auto Accompanied By: self Schedule Follow-up Appointment: Yes Clinical Summary of Care: Notes Patient has an HBO treatment scheduled on 07/04/21 at 09:00 am. Electronic Signature(s) Signed: 07/03/2021 11:38:39 AM By: Enedina Finner RCP, RRT, CHT Entered By: Enedina Finner on 07/03/2021 11:38:15 Melissa [name] ([id number]) -------------------------------------------------------------------------------- Vitals Details Patient Name: Melissa [name] Date of Service: 07/03/2021 9:00 AM Medical Record Number: [id number] Patient Account Number: [id number] Date of Birth/Sex: April 03, 1946 (75 y.o. F) Treating RN: Dolan Amen Primary Care Herberta Pickron: Miguel Aschoff Other Clinician: Jacqulyn Bath Referring Kohle Winner: Miguel Aschoff Treating Alyssah Algeo/Extender: Skipper Cliche in Treatment: 7 Vital [name] Time Taken: 08:45 Temperature (F): 98.2 Height (in): 61.5 Pulse (bpm): 78 Weight (lbs): 178 Respiratory Rate (breaths/min): 16 Body Mass Index (BMI): 33.1 Blood Pressure (mmHg): 144/62 Reference Range: 80 - 120 mg / dl Electronic Signature(s) Signed: 07/03/2021 11:38:39 AM By: Enedina Finner RCP, RRT, CHT Entered By: Enedina Finner on 07/03/2021 09:02:49

## 2021-07-04 ENCOUNTER — Other Ambulatory Visit: Payer: Self-pay

## 2021-07-04 ENCOUNTER — Encounter: Payer: PPO | Admitting: Physician Assistant

## 2021-07-04 DIAGNOSIS — L598 Other specified disorders of the skin and subcutaneous tissue related to radiation: Secondary | ICD-10-CM | POA: Diagnosis not present

## 2021-07-05 NOTE — Progress Notes (Signed)
VELENA, MCVEIGH ([id number]) Visit Report for 07/04/2021 Arrival Information Details Patient Name: Melissa [name], Melissa [name]. Date of Service: 07/04/2021 9:00 AM Medical Record Number: [id number] Patient Account Number: [id number] Date of Birth/Sex: 1946-03-25 (75 y.o. F) Treating RN: Carlene Coria Primary Care Anaika Santillano: Miguel Aschoff Other Clinician: Jacqulyn Bath Referring Mabrey Howland: Miguel Aschoff Treating Jaidyn Kuhl/Extender: Skipper Cliche in Treatment: 7 Visit Information History Since Last Visit Added or deleted any medications: No Patient Arrived: Ambulatory Any new allergies or adverse reactions: No Arrival Time: 08:44 [name] or symptoms of abuse/neglect since last visito No Accompanied By: self Hospitalized since last visit: No Transfer Assistance: None Implantable device outside of the clinic excluding No Patient Identification Verified: Yes cellular tissue based products placed in the center Secondary Verification Process Completed: Yes since last visit: Patient Has Alerts: Yes Pain Present Now: No Patient Alerts: NOT diabetic Electronic Signature(s) Signed: 07/04/2021 2:03:55 PM By: Enedina Finner RCP, RRT, CHT Entered By: Stark Jock, Amado Nash on 07/04/2021 08:45:01 Melissa [name] ([id number]) -------------------------------------------------------------------------------- Encounter Discharge Information Details Patient Name: Melissa [name] Date of Service: 07/04/2021 9:00 AM Medical Record Number: [id number] Patient Account Number: [id number] Date of Birth/Sex: 26-Jun-1946 (75 y.o. F) Treating RN: Carlene Coria Primary Care Tracey Hermance: Miguel Aschoff Other Clinician: Jacqulyn Bath Referring Josyah Achor: Miguel Aschoff Treating Delaila Nand/Extender: Skipper Cliche in Treatment: 7 Encounter Discharge Information Items Discharge Condition: Stable Ambulatory Status: Ambulatory Discharge Destination: Home Transportation: Private  Auto Accompanied By: self Schedule Follow-up Appointment: Yes Clinical Summary of Care: Notes Patient has an HBO treatment scheduled on 07/07/21 at 09:00 am. Electronic Signature(s) Signed: 07/04/2021 2:03:55 PM By: Enedina Finner RCP, RRT, CHT Entered By: Enedina Finner on 07/04/2021 12:11:42 Melissa [name] ([id number]) -------------------------------------------------------------------------------- Vitals Details Patient Name: Melissa [name] Date of Service: 07/04/2021 9:00 AM Medical Record Number: [id number] Patient Account Number: [id number] Date of Birth/Sex: 1946/09/03 (75 y.o. F) Treating RN: Carlene Coria Primary Care Allisson Schindel: Miguel Aschoff Other Clinician: Jacqulyn Bath Referring Camaryn Lumbert: Miguel Aschoff Treating Paulla Mcclaskey/Extender: Skipper Cliche in Treatment: 7 Vital [name] Time Taken: 08:55 Temperature (F): 98.4 Height (in): 61.5 Pulse (bpm): 72 Weight (lbs): 178 Respiratory Rate (breaths/min): 16 Body Mass Index (BMI): 33.1 Blood Pressure (mmHg): 144/62 Reference Range: 80 - 120 mg / dl Electronic Signature(s) Signed: 07/04/2021 2:03:55 PM By: Enedina Finner RCP, RRT, CHT Entered By: Enedina Finner on 07/04/2021 08:59:53

## 2021-07-05 NOTE — Progress Notes (Signed)
BRYNLY, DEROGATIS ([id number]) Visit Report for 07/04/2021 Problem List Details Patient Name: Melissa [name], Melissa [name]. Date of Service: 07/04/2021 9:00 AM Medical Record Number: [id number] Patient Account Number: [id number] Date of Birth/Sex: 12-Sep-1946 (75 y.o. 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, rectosigmoid junction, and anus Facility Procedures CPT4 Code: [id number] Description: (Facility Use Only) HBOT, full body chamber, 105mn Modifier: Quantity: 4 Physician Procedures CPT4 Code: [id number]: [id number] PHYS HYPERBARIC OXYGEN THERAPY Modifier: Quantity:  1 CPT4 Code: Description: ICD-10 Diagnosis Description L59.8 Other specified disorders of the skin and subcutaneous tissue related to Z85.048 Personal history of other malignant neoplasm of rectum, rectosigmoid junc Modifier: radiation tion, and anus Quantity: Electronic Signature(s) Signed: 07/04/2021 4:21:38 PM By: SWorthy KeelerPA-C Previous Signature: 07/04/2021 2:03:55 PM Version By: WEnedina FinnerRCP, RRT, CHT Entered By: SWorthy Keeleron 07/04/2021 16:21:38

## 2021-07-05 NOTE — Progress Notes (Signed)
Melissa, [name] ([id number]) Visit Report for 07/04/2021 HBO Details Patient Name: Melissa [name], Melissa [name]. Date of Service: 07/04/2021 9:00 AM Medical Record Number: [id number] Patient Account Number: [id number] Date of Birth/Sex: 10-02-1946 (75 y.o. 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 HBO Treatment Details Treatment Number: 23 Patient Type: Outpatient Chamber Type: Monoplace Chamber Serial #: [id number] Treatment Protocol: 2.5 ATA with 90 minutes oxygen, with two 5 minute air breaks Treatment Details Compression Rate Down: 1.5 psi / minute De-Compression Rate Up: 2.0 psi / minute Compress Tx Pressure Air breaks and breathing periods Decompress Decompress Begins Reached (leave unused spaces blank) Begins Ends Chamber Pressure (ATA) 1 2.5 2.5 2.5 2.5 2.5 - - 2.5 1 Clock Time (24 hr) 08:58 09:13 09:43 09:49 10:19 10:24 - - 10:55 11:06 Treatment Length: 128 (minutes) Treatment Segments: 4 Vital Signs Capillary Blood Glucose Reference Range: 80 - 120 mg / dl HBO Diabetic Blood Glucose Intervention Range: <131 mg/dl or >249 mg/dl Time Vitals Blood Respiratory Capillary Blood Glucose Pulse Action Type: Pulse: Temperature: Taken: Pressure: Rate: Glucose (mg/dl): Meter #: Oximetry (%) Taken: Pre 08:55 144/62 72 16 98.4 Post 11:10 134/66 72 16 98.3 Treatment Response Treatment Toleration: Well Treatment Completion Treatment Completed without Adverse Event Status: HBO Attestation I certify that I supervised this HBO treatment in accordance with Medicare guidelines. A trained emergency response team is readily  Yes available per hospital policies and procedures. Continue HBOT as ordered. Yes Electronic Signature(s) Signed: 07/04/2021 4:21:33 PM By: Worthy Keeler PA-C Previous Signature: 07/04/2021 2:03:55 PM Version By: Enedina Finner RCP, RRT, CHT Entered By: Worthy Keeler on 07/04/2021 16:21:32 GENI, LEEMON ([id number]) -------------------------------------------------------------------------------- HBO Safety Checklist Details Patient Name: Melissa [name] Signs Date of Service: 07/04/2021 9:00 AM Medical Record Number: [id number] Patient Account Number: [id number] Date of Birth/Sex: 09-06-1946 (75 y.o. F) Treating RN: Carlene Coria Primary Care Haydon Dorris: Miguel Aschoff Other Clinician: Jacqulyn Bath Referring Maraki Macquarrie: Miguel Aschoff Treating Emalyn Schou/Extender: Skipper Cliche in Treatment: 7 HBO Safety Checklist Items Safety Checklist Consent Form Signed Patient voided / foley secured and emptied When did you last eato 07:00 am Last dose of injectable or oral agent na Ostomy pouch emptied and vented if applicable NA All implantable devices assessed, documented and approved NA Intravenous access site secured and place NA Valuables secured Linens and cotton and cotton/polyester blend (less than 51% polyester) Personal oil-based products / skin lotions / body lotions removed Wigs or hairpieces removed NA Smoking or tobacco materials removed NA Books / newspapers / magazines / loose paper removed NA Cologne, aftershave, perfume and deodorant removed Jewelry removed (may wrap wedding band) Make-up removed Hair care products removed Battery operated devices (external) removed NA Heating patches and chemical warmers removed NA Titanium eyewear removed NA Nail polish cured greater than 10 hours NA Casting material cured greater than 10 hours NA Hearing aids removed NA Loose dentures or partials removed NA Prosthetics have been  removed NA Patient demonstrates correct use of air break device (if applicable) Patient concerns have been addressed Patient grounding bracelet on and cord attached to chamber Specifics for Inpatients (complete in addition to above) Medication sheet sent with patient Intravenous medications needed or due during therapy sent with patient Drainage tubes (e.g. nasogastric  tube or chest tube secured and vented) Endotracheal or Tracheotomy tube secured Cuff deflated of air and inflated with saline Airway suctioned Electronic Signature(s) Signed: 07/04/2021 2:03:55 PM By: Enedina Finner RCP, RRT, CHT Entered By: Enedina Finner on 07/04/2021 09:00:55

## 2021-07-07 ENCOUNTER — Other Ambulatory Visit: Payer: Self-pay

## 2021-07-07 ENCOUNTER — Ambulatory Visit: Payer: Self-pay | Admitting: Family Medicine

## 2021-07-07 ENCOUNTER — Encounter: Payer: PPO | Attending: Physician Assistant | Admitting: Physician Assistant

## 2021-07-07 DIAGNOSIS — Z85048 Personal history of other malignant neoplasm of rectum, rectosigmoid junction, and anus: Secondary | ICD-10-CM | POA: Insufficient documentation

## 2021-07-07 DIAGNOSIS — Z923 Personal history of irradiation: Secondary | ICD-10-CM | POA: Insufficient documentation

## 2021-07-07 DIAGNOSIS — Z9221 Personal history of antineoplastic chemotherapy: Secondary | ICD-10-CM | POA: Diagnosis not present

## 2021-07-07 DIAGNOSIS — Y842 Radiological procedure and radiotherapy as the cause of abnormal reaction of the patient, or of later complication, without mention of misadventure at the time of the procedure: Secondary | ICD-10-CM | POA: Diagnosis not present

## 2021-07-07 DIAGNOSIS — R0602 Shortness of breath: Secondary | ICD-10-CM | POA: Insufficient documentation

## 2021-07-07 DIAGNOSIS — L598 Other specified disorders of the skin and subcutaneous tissue related to radiation: Secondary | ICD-10-CM | POA: Diagnosis not present

## 2021-07-08 ENCOUNTER — Other Ambulatory Visit: Payer: Self-pay | Admitting: Surgical

## 2021-07-08 ENCOUNTER — Encounter: Payer: PPO | Admitting: Physician Assistant

## 2021-07-08 DIAGNOSIS — L598 Other specified disorders of the skin and subcutaneous tissue related to radiation: Secondary | ICD-10-CM | POA: Diagnosis not present

## 2021-07-08 MED ORDER — CLOBETASOL PROPIONATE 0.05 % EX OINT: 1.0000 "application " | TOPICAL_OINTMENT | Freq: Two times a day (BID) | CUTANEOUS | 3 refills | Status: AC

## 2021-07-08 NOTE — Progress Notes (Signed)
Melissa [name], Melissa [name] ([id number]) Visit Report for 07/08/2021 Arrival Information Details Patient Name: Melissa [name], Melissa [name]. Date of Service: 07/08/2021 9:00 AM Medical Record Number: [id number] Patient Account Number: [id number] Date of Birth/Sex: [birth year] (75 y.o. F) Treating RN: Dolan Amen Primary Care Laurina Fischl: Miguel Aschoff Other Clinician: Jacqulyn Bath Referring Lakrista Scaduto: Miguel Aschoff Treating Rilya Longo/Extender: Skipper Cliche in Treatment: 7 Visit Information History Since Last Visit Added or deleted any medications: No Patient Arrived: Ambulatory Any new allergies or adverse reactions: No Arrival Time: 08:45 Had a fall or experienced change in No Accompanied By: self activities of daily living that may affect Transfer Assistance: None risk of falls: Patient Identification Verified: Yes [name] or symptoms of abuse/neglect since last visito No Secondary Verification Process Completed: Yes Hospitalized since last visit: No Patient Has Alerts: Yes Implantable device outside of the clinic excluding No Patient Alerts: NOT diabetic cellular tissue based products placed in the center since last visit: Pain Present Now: No Electronic Signature(s) Signed: 07/08/2021 11:51:26 AM By: Enedina Finner RCP, RRT, CHT Entered By: Enedina Finner on 07/08/2021 09:01:12 Melissa [name] ([id number]) -------------------------------------------------------------------------------- Encounter Discharge Information Details Patient Name: Melissa [name] Date of Service: 07/08/2021 9:00 AM Medical Record Number: [id number] Patient Account Number: [id number] Date of Birth/Sex: September 20, 1946 (75 y.o. F) Treating RN: Dolan Amen Primary Care Rosamund Nyland: Miguel Aschoff Other Clinician: Jacqulyn Bath Referring Shakeema Lippman: Miguel Aschoff Treating Kalenna Millett/Extender: Skipper Cliche in Treatment: 7 Encounter Discharge Information Items Discharge Condition:  Stable Ambulatory Status: Ambulatory Discharge Destination: Home Transportation: Private Auto Accompanied By: self Schedule Follow-up Appointment: Yes Clinical Summary of Care: Notes Patient has an HBO treatment scheduled on 07/09/21 at 09:00 am. Electronic Signature(s) Signed: 07/08/2021 11:51:26 AM By: Enedina Finner RCP, RRT, CHT Entered By: Enedina Finner on 07/08/2021 11:50:58 Melissa [name] ([id number]) -------------------------------------------------------------------------------- Vitals Details Patient Name: Melissa [name] Date of Service: 07/08/2021 9:00 AM Medical Record Number: [id number] Patient Account Number: [id number] Date of Birth/Sex: 06/10/1946 (75 y.o. F) Treating RN: Dolan Amen Primary Care Lorenzo Arscott: Miguel Aschoff Other Clinician: Jacqulyn Bath Referring Ramla Hase: Miguel Aschoff Treating Genavie Boettger/Extender: Skipper Cliche in Treatment: 7 Vital [name] Time Taken: 08:05 Temperature (F): 98.3 Height (in): 61.5 Pulse (bpm): 78 Weight (lbs): 178 Respiratory Rate (breaths/min): 16 Body Mass Index (BMI): 33.1 Blood Pressure (mmHg): 138/62 Reference Range: 80 - 120 mg / dl Electronic Signature(s) Signed: 07/08/2021 11:51:26 AM By: Enedina Finner RCP, RRT, CHT Entered By: Enedina Finner on 07/08/2021 09:08:06

## 2021-07-08 NOTE — Progress Notes (Signed)
ORRIS, GRASSE ([id number]) Visit Report for 07/07/2021 Arrival Information Details Patient Name: Melissa [name]. Date of Service: 07/07/2021 9:00 AM Medical Record Number: [id number] Patient Account Number: [id number] Date of Birth/Sex: 01-18-1946 (75 y.o. F) Treating RN: Donnamarie Poag Primary Care Jary Louvier: Miguel Aschoff Other Clinician: Jacqulyn Bath Referring Chudney Scheffler: Miguel Aschoff Treating Chaise Mahabir/Extender: Skipper Cliche in Treatment: 7 Visit Information History Since Last Visit Added or deleted any medications: No Patient Arrived: Ambulatory Any new allergies or adverse reactions: No Arrival Time: 08:48 Had a fall or experienced change in No Accompanied By: self activities of daily living that may affect Transfer Assistance: None risk of falls: Patient Identification Verified: Yes [name] or symptoms of abuse/neglect since last visito No Secondary Verification Process Completed: Yes Hospitalized since last visit: No Patient Has Alerts: Yes Implantable device outside of the clinic excluding No Patient Alerts: NOT diabetic cellular tissue based products placed in the center since last visit: Pain Present Now: No Electronic Signature(s) Signed: 07/08/2021 11:51:26 AM By: Enedina Finner RCP, RRT, CHT Entered By: Enedina Finner on 07/07/2021 09:01:43 Melissa [name] ([id number]) -------------------------------------------------------------------------------- Encounter Discharge Information Details Patient Name: Melissa [name] Date of Service: 07/07/2021 9:00 AM Medical Record Number: [id number] Patient Account Number: [id number] Date of Birth/Sex: [birth year] (75 y.o. F) Treating RN: Donnamarie Poag Primary Care Kambri Dismore: Miguel Aschoff Other Clinician: Jacqulyn Bath Referring Desere Gwin: Miguel Aschoff Treating Shalane Florendo/Extender: Skipper Cliche in Treatment: 7 Encounter Discharge Information Items Discharge Condition:  Stable Ambulatory Status: Ambulatory Discharge Destination: Home Transportation: Private Auto Accompanied By: self Schedule Follow-up Appointment: Yes Clinical Summary of Care: Notes Patient has an HBO treatment scheduled on 07/08/21 at 09:00 am. Electronic Signature(s) Signed: 07/08/2021 11:51:26 AM By: Enedina Finner RCP, RRT, CHT Entered By: Enedina Finner on 07/07/2021 14:04:01 Melissa [name] ([id number]) -------------------------------------------------------------------------------- Vitals Details Patient Name: Melissa [name] Date of Service: 07/07/2021 9:00 AM Medical Record Number: [id number] Patient Account Number: [id number] Date of Birth/Sex: Jun 13, 1946 (75 y.o. F) Treating RN: Donnamarie Poag Primary Care Koda Defrank: Miguel Aschoff Other Clinician: Jacqulyn Bath Referring Brisha Mccabe: Miguel Aschoff Treating Kaylen Motl/Extender: Skipper Cliche in Treatment: 7 Vital [name] Time Taken: 08:50 Temperature (F): 98.3 Height (in): 61.5 Pulse (bpm): 72 Weight (lbs): 178 Respiratory Rate (breaths/min): 16 Body Mass Index (BMI): 33.1 Blood Pressure (mmHg): 144/62 Reference Range: 80 - 120 mg / dl Electronic Signature(s) Signed: 07/08/2021 11:51:26 AM By: Enedina Finner RCP, RRT, CHT Entered By: Enedina Finner on 07/07/2021 09:05:03

## 2021-07-08 NOTE — Progress Notes (Signed)
Melissa, [name] ([id number]) Visit Report for 07/07/2021 HBO Details Patient Name: Melissa [name], Melissa [name]. Date of Service: 07/07/2021 9:00 AM Medical Record Number: [id number] Patient Account Number: [id number] Date of Birth/Sex: 1946/04/03 (75 y.o. 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 HBO Treatment Details Treatment Number: 24 Patient Type: Outpatient Chamber Type: Monoplace Chamber Serial #: [id number] Treatment Protocol: 2.5 ATA with 90 minutes oxygen, with two 5 minute air breaks Treatment Details Compression Rate Down: 1.5 psi / minute De-Compression Rate Up: 1.5 psi / minute Compress Tx Pressure Air breaks and breathing periods Decompress Decompress Begins Reached (leave unused spaces blank) Begins Ends Chamber Pressure (ATA) 1 2.5 2.5 2.5 2.5 2.5 - - 2.5 1 Clock Time (24 hr) 09:00 09:16 09:46 09:51 10:21 10:27 - - 10:57 11:12 Treatment Length: 132 (minutes) Treatment Segments: 4 Vital [name] Capillary Blood Glucose Reference Range: 80 - 120 mg / dl HBO Diabetic Blood Glucose Intervention Range: <131 mg/dl or >249 mg/dl Time Vitals Blood Respiratory Capillary Blood Glucose Pulse Action Type: Pulse: Temperature: Taken: Pressure: Rate: Glucose (mg/dl): Meter #: Oximetry (%) Taken: Pre 08:50 144/62 72 16 98.3 Post 11:15 128/66 78 16 98.5 Treatment Response Treatment Toleration: Well Treatment Completion Treatment Completed without Adverse Event Status: Electronic Signature(s) Signed: 07/07/2021 4:30:46 PM By: Worthy Keeler PA-C Signed: 07/08/2021 11:51:26 AM By: Enedina Finner RCP, RRT, CHT Entered  By: Enedina Finner on 07/07/2021 14:03:07 Melissa [name] ([id number]) -------------------------------------------------------------------------------- HBO Safety Checklist Details Patient Name: Melissa [name]. Date of Service: 07/07/2021 9:00 AM Medical Record Number: [id number] Patient Account Number: [id number] Date of Birth/Sex: 03-15-1946 (75 y.o. F) Treating RN: Donnamarie Poag Primary Care Inge Waldroup: Miguel Aschoff Other Clinician: Jacqulyn Bath Referring Martin Smeal: Miguel Aschoff Treating Myking Sar/Extender: Skipper Cliche in Treatment: 7 HBO Safety Checklist Items Safety Checklist Consent Form Signed Patient voided / foley secured and emptied When did you last eato 07:00 am Last dose of injectable or oral agent na Ostomy pouch emptied and vented if applicable NA All implantable devices assessed, documented and approved NA Intravenous access site secured and place NA Valuables secured Linens and cotton and cotton/polyester blend (less than 51% polyester) Personal oil-based products / skin lotions / body lotions removed Wigs or hairpieces removed NA Smoking or tobacco materials removed NA Books / newspapers / magazines / loose paper removed NA Cologne, aftershave, perfume and deodorant removed Jewelry removed (may wrap wedding band) Make-up removed Hair care products removed Battery operated devices (external) removed NA Heating patches and chemical warmers removed NA Titanium eyewear removed NA Nail polish cured greater than 10 hours NA Casting material cured greater than 10 hours NA Hearing aids removed NA Loose dentures or partials removed NA Prosthetics have been removed NA Patient demonstrates correct use of air break device (if applicable) Patient concerns have been addressed Patient grounding bracelet on and cord attached to chamber Specifics for Inpatients (complete in addition to above) Medication sheet sent with  patient Intravenous medications needed or due during therapy sent with patient Drainage tubes (e.g. nasogastric tube or chest tube secured and vented) Endotracheal or Tracheotomy tube secured Cuff deflated of air and inflated with saline Airway suctioned Electronic Signature(s) Signed: 07/08/2021 11:51:26 AM By: Stark Jock, Sallie RCP, RRT, CHT Entered  By: Enedina Finner on 07/07/2021 09:07:18

## 2021-07-09 ENCOUNTER — Encounter (HOSPITAL_BASED_OUTPATIENT_CLINIC_OR_DEPARTMENT_OTHER): Payer: PPO | Admitting: Internal Medicine

## 2021-07-09 ENCOUNTER — Other Ambulatory Visit: Payer: Self-pay

## 2021-07-09 DIAGNOSIS — Z85048 Personal history of other malignant neoplasm of rectum, rectosigmoid junction, and anus: Secondary | ICD-10-CM

## 2021-07-09 DIAGNOSIS — L598 Other specified disorders of the skin and subcutaneous tissue related to radiation: Secondary | ICD-10-CM

## 2021-07-09 NOTE — Progress Notes (Signed)
SHANTERRICA, BLESSINGER ([id number]) Visit Report for 07/09/2021 Arrival Information Details Patient Name: Melissa [name], Melissa [name]. Date of Service: 07/09/2021 9:00 AM Medical Record Number: [id number] Patient Account Number: [id number] Date of Birth/Sex: 07-Feb-1946 (75 y.o. F) Treating RN: Cornell Barman Primary Care Manjot Hinks: Miguel Aschoff Other Clinician: Jacqulyn Bath Referring Marieann Zipp: Miguel Aschoff Treating Babita Amaker/Extender: Yaakov Guthrie in Treatment: 7 Visit Information History Since Last Visit Added or deleted any medications: No Patient Arrived: Ambulatory Any new allergies or adverse reactions: No Arrival Time: 08:45 Had a fall or experienced change in No Accompanied By: self activities of daily living that may affect Transfer Assistance: None risk of falls: Patient Identification Verified: Yes [name] or symptoms of abuse/neglect since last visito No Secondary Verification Process Completed: Yes Hospitalized since last visit: No Patient Has Alerts: Yes Implantable device outside of the clinic excluding No Patient Alerts: NOT diabetic cellular tissue based products placed in the center since last visit: Pain Present Now: No Electronic Signature(s) Signed: 07/09/2021 1:06:17 PM By: Enedina Finner RCP, RRT, CHT Entered By: Stark Jock, Amado Nash on 07/09/2021 09:03:59 Melissa [name] ([id number]) -------------------------------------------------------------------------------- Encounter Discharge Information Details Patient Name: Melissa [name] Date of Service: 07/09/2021 9:00 AM Medical Record Number: [id number] Patient Account Number: [id number] Date of Birth/Sex: 1946-04-27 (75 y.o. F) Treating RN: Cornell Barman Primary Care Shamere Dilworth: Miguel Aschoff Other Clinician: Jacqulyn Bath Referring Buck Mcaffee: Miguel Aschoff Treating Latisia Hilaire/Extender: Yaakov Guthrie in Treatment: 7 Encounter Discharge Information Items Discharge Condition:  Stable Ambulatory Status: Ambulatory Discharge Destination: Home Transportation: Private Auto Accompanied By: self Schedule Follow-up Appointment: Yes Clinical Summary of Care: Notes Patient has an HBO treatment scheduled on 07/10/21 at 09:00 am. Electronic Signature(s) Signed: 07/09/2021 1:06:17 PM By: Enedina Finner RCP, RRT, CHT Entered By: Enedina Finner on 07/09/2021 11:48:05 Melissa [name] ([id number]) -------------------------------------------------------------------------------- [place] Details Patient Name: Melissa [name] Date of Service: 07/09/2021 9:00 AM Medical Record Number: [id number] Patient Account Number: [id number] Date of Birth/Sex: 1946-05-30 (75 y.o. F) Treating RN: Cornell Barman Primary Care Carnesha Maravilla: Miguel Aschoff Other Clinician: Jacqulyn Bath Referring Miran Kautzman: Miguel Aschoff Treating Leshay Desaulniers/Extender: Yaakov Guthrie in Treatment: 7 Vital [name] Time Taken: 08:55 Temperature (F): 98.7 Height (in): 61.5 Pulse (bpm): 72 Weight (lbs): 178 Respiratory Rate (breaths/min): 16 Body Mass Index (BMI): 33.1 Blood Pressure (mmHg): 152/60 Reference Range: 80 - 120 mg / dl Electronic Signature(s) Signed: 07/09/2021 1:06:17 PM By: Enedina Finner RCP, RRT, CHT Entered By: Enedina Finner on 07/09/2021 09:11:02

## 2021-07-09 NOTE — Progress Notes (Signed)
HALANA, HAEFFNER ([id number]) Visit Report for 07/08/2021 HBO Details Patient Name: Melissa [name], Melissa [name]. Date of Service: 07/08/2021 9:00 AM Medical Record Number: [id number] Patient Account Number: [id number] Date of Birth/Sex: [birth year] (75 y.o. 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 HBO Treatment Details Treatment Number: 25 Patient Type: Outpatient Chamber Type: Monoplace Chamber Serial #: [id number] Treatment Protocol: 2.5 ATA with 90 minutes oxygen, with two 5 minute air breaks Treatment Details Compression Rate Down: 1.5 psi / minute De-Compression Rate Up: 1.5 psi / minute Compress Tx Pressure Air breaks and breathing periods Decompress Decompress Begins Reached (leave unused spaces blank) Begins Ends Chamber Pressure (ATA) 1 2.5 2.5 2.5 2.5 2.5 - - 2.5 1 Clock Time (24 hr) 09:00 09:15 09:45 09:51 09:22 09:27 - - 10:57 11:11 Treatment Length: 131 (minutes) Treatment Segments: 4 Vital [name] Capillary Blood Glucose Reference Range: 80 - 120 mg / dl HBO Diabetic Blood Glucose Intervention Range: <131 mg/dl or >249 mg/dl Time Vitals Blood Respiratory Capillary Blood Glucose Pulse Action Type: Pulse: Temperature: Taken: Pressure: Rate: Glucose (mg/dl): Meter #: Oximetry (%) Taken: Pre 08:05 138/62 78 16 98.3 Post 11:15 130/60 72 16 98.4 Treatment Response Treatment Toleration: Well Treatment Completion Treatment Completed without Adverse Event Status: Electronic Signature(s) Signed: 07/08/2021 11:51:26 AM By: Enedina Finner RCP, RRT, CHT Signed: 07/08/2021 6:20:17 PM By: Worthy Keeler  PA-C Entered By: Stark Jock, Amado Nash on 07/08/2021 11:50:12 Melissa [name] ([id number]) -------------------------------------------------------------------------------- HBO Safety Checklist Details Patient Name: Melissa [name]. Date of Service: 07/08/2021 9:00 AM Medical Record Number: [id number] Patient Account Number: [id number] Date of Birth/Sex: 1946/11/02 (75 y.o. F) Treating RN: Dolan Amen Primary Care Jnae Thomaston: Miguel Aschoff Other Clinician: Jacqulyn Bath Referring Jacqualynn Parco: Miguel Aschoff Treating Jackalyn Haith/Extender: Skipper Cliche in Treatment: 7 HBO Safety Checklist Items Safety Checklist Consent Form Signed Patient voided / foley secured and emptied When did you last eato 07:00 am Last dose of injectable or oral agent na Ostomy pouch emptied and vented if applicable NA All implantable devices assessed, documented and approved NA Intravenous access site secured and place NA Valuables secured Linens and cotton and cotton/polyester blend (less than 51% polyester) Personal oil-based products / skin lotions / body lotions removed Wigs or hairpieces removed NA Smoking or tobacco materials removed NA Books / newspapers / magazines / loose paper removed NA Cologne, aftershave, perfume and deodorant removed Jewelry removed (may wrap wedding band) Make-up removed Hair care products removed Battery operated devices (external) removed NA Heating patches and chemical warmers removed NA Titanium eyewear removed NA Nail polish cured greater than 10 hours NA Casting material cured greater than 10 hours NA Hearing aids removed NA Loose dentures or partials removed NA Prosthetics have been removed NA Patient demonstrates correct use of air break device (if applicable) Patient concerns have been addressed Patient grounding bracelet on and cord attached to chamber Specifics for Inpatients (complete in addition to above) Medication sheet sent  with patient Intravenous medications needed or due during therapy sent with patient Drainage tubes (e.g. nasogastric tube or chest tube secured and vented) Endotracheal or Tracheotomy tube secured Cuff deflated of air and inflated with saline Airway suctioned Electronic Signature(s) Signed: 07/08/2021 11:51:26 AM By: Stark Jock, Sallie RCP, RRT, CHT Entered  By: Enedina Finner on 07/08/2021 09:09:35

## 2021-07-09 NOTE — Progress Notes (Signed)
SHAWNY, DORSETT ([id number]) Visit Report for 07/09/2021 HBO Details Patient Name: Melissa [name], Melissa [name]. Date of Service: 07/09/2021 9:00 AM Medical Record Number: [id number] Patient Account Number: [id number] Date of Birth/Sex: [birth year] (75 y.o. 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 HBO Treatment Details Treatment Number: 26 Patient Type: Outpatient Chamber Type: Monoplace Chamber Serial #: [id number] Treatment Protocol: 2.5 ATA with 90 minutes oxygen, with two 5 minute air breaks Treatment Details Compression Rate Down: 1.5 psi / minute De-Compression Rate Up: 1.5 psi / minute Compress Tx Pressure Air breaks and breathing periods Decompress Decompress Begins Reached (leave unused spaces blank) Begins Ends Chamber Pressure (ATA) 1 2.5 2.5 2.5 2.5 2.5 - - 2.5 1 Clock Time (24 hr) 08:58 09:13 09:43 09:48 10:18 10:23 - - 10:53 11:08 Treatment Length: 130 (minutes) Treatment Segments: 4 Vital [name] Capillary Blood Glucose Reference Range: 80 - 120 mg / dl HBO Diabetic Blood Glucose Intervention Range: <131 mg/dl or >249 mg/dl Time Vitals Blood Respiratory Capillary Blood Glucose Pulse Action Type: Pulse: Temperature: Taken: Pressure: Rate: Glucose (mg/dl): Meter #: Oximetry (%) Taken: Pre 08:55 152/60 72 16 98.7 Post 11:10 140/66 72 16 98.7 Treatment Response Treatment Toleration: Well Treatment Completion Treatment Completed without Adverse Event Status: HBO Attestation I certify that I supervised this HBO treatment in accordance with Medicare guidelines. A trained emergency response team is readily  Yes available per hospital policies and procedures. Continue HBOT as ordered. Yes Electronic Signature(s) Signed: 07/09/2021 4:27:41 PM By: Kalman Shan DO Previous Signature: 07/09/2021 1:06:17 PM Version By: Enedina Finner RCP, RRT, CHT Entered By: Kalman Shan on 07/09/2021 16:26:58 Melissa [name] ([id number]) -------------------------------------------------------------------------------- HBO Safety Checklist Details Patient Name: Melissa [name] Date of Service: 07/09/2021 9:00 AM Medical Record Number: [id number] Patient Account Number: [id number] Date of Birth/Sex: 1946/04/15 (75 y.o. F) Treating RN: Cornell Barman Primary Care Huberta Tompkins: Miguel Aschoff Other Clinician: Jacqulyn Bath Referring Jadyn Brasher: Miguel Aschoff Treating Yuka Lallier/Extender: Yaakov Guthrie in Treatment: 7 HBO Safety Checklist Items Safety Checklist Consent Form Signed Patient voided / foley secured and emptied When did you last eato 07:00 am Last dose of injectable or oral agent na Ostomy pouch emptied and vented if applicable NA All implantable devices assessed, documented and approved NA Intravenous access site secured and place NA Valuables secured Linens and cotton and cotton/polyester blend (less than 51% polyester) Personal oil-based products / skin lotions / body lotions removed Wigs or hairpieces removed NA Smoking or tobacco materials removed NA Books / newspapers / magazines / loose paper removed NA Cologne, aftershave, perfume and deodorant removed Jewelry removed (may wrap wedding band) Make-up removed Hair care products removed Battery operated devices (external) removed NA Heating patches and chemical warmers removed NA Titanium eyewear removed NA Nail polish cured greater than 10 hours NA Casting material cured greater than 10 hours NA Hearing aids removed NA Loose dentures or partials removed NA Prosthetics have been  removed NA Patient demonstrates correct use of air break device (if applicable) Patient concerns have been addressed Patient grounding bracelet on and cord attached to chamber Specifics for Inpatients (complete in addition to above) Medication sheet sent with patient Intravenous medications needed or due during therapy sent with patient Drainage tubes (e.g. nasogastric tube or  chest tube secured and vented) Endotracheal or Tracheotomy tube secured Cuff deflated of air and inflated with saline Airway suctioned Electronic Signature(s) Signed: 07/09/2021 1:06:17 PM By: Enedina Finner RCP, RRT, CHT Entered By: Enedina Finner on 07/09/2021 09:12:24

## 2021-07-10 ENCOUNTER — Encounter: Payer: PPO | Admitting: Physician Assistant

## 2021-07-10 DIAGNOSIS — L598 Other specified disorders of the skin and subcutaneous tissue related to radiation: Secondary | ICD-10-CM | POA: Diagnosis not present

## 2021-07-10 NOTE — Progress Notes (Signed)
Melissa [name], Melissa [name] ([id number]) Visit Report for 07/10/2021 Arrival Information Details Patient Name: Melissa [name], Melissa [name]. Date of Service: 07/10/2021 9:00 AM Medical Record Number: [id number] Patient Account Number: [id number] Date of Birth/Sex: 20-Aug-1946 (75 y.o. F) Treating RN: Dolan Amen Primary Care Raiyan Dalesandro: Miguel Aschoff Other Clinician: Jacqulyn Bath Referring Ryleah Miramontes: Miguel Aschoff Treating Kathleene Bergemann/Extender: Skipper Cliche in Treatment: 8 Visit Information History Since Last Visit Added or deleted any medications: No Patient Arrived: Ambulatory Any new allergies or adverse reactions: No Arrival Time: 08:45 Had a fall or experienced change in No Accompanied By: self activities of daily living that may affect Transfer Assistance: None risk of falls: Patient Identification Verified: Yes [name] or symptoms of abuse/neglect since last visito No Secondary Verification Process Completed: Yes Hospitalized since last visit: No Patient Has Alerts: Yes Implantable device outside of the clinic excluding No Patient Alerts: NOT diabetic cellular tissue based products placed in the center since last visit: Pain Present Now: No Electronic Signature(s) Signed: 07/10/2021 11:40:24 AM By: Enedina Finner RCP, RRT, CHT Entered By: Enedina Finner on 07/10/2021 08:51:05 Melissa [name] ([id number]) -------------------------------------------------------------------------------- Encounter Discharge Information Details Patient Name: Melissa [name] Date of Service: 07/10/2021 9:00 AM Medical Record Number: [id number] Patient Account Number: [id number] Date of Birth/Sex: 1946-09-21 (75 y.o. F) Treating RN: Dolan Amen Primary Care Sarinity Dicicco: Miguel Aschoff Other Clinician: Jacqulyn Bath Referring Hyland Mollenkopf: Miguel Aschoff Treating Ordell Prichett/Extender: Skipper Cliche in Treatment: 8 Encounter Discharge Information Items Discharge Condition:  Stable Ambulatory Status: Ambulatory Discharge Destination: Home Transportation: Private Auto Accompanied By: self Schedule Follow-up Appointment: Yes Clinical Summary of Care: Notes Patient has an HBO treatment scheduled on 07/11/21 at 09:00 am. Electronic Signature(s) Signed: 07/10/2021 11:40:24 AM By: Enedina Finner RCP, RRT, CHT Entered By: Enedina Finner on 07/10/2021 11:40:04 Melissa [name] ([id number]) -------------------------------------------------------------------------------- Vitals Details Patient Name: Melissa [name] Date of Service: 07/10/2021 9:00 AM Medical Record Number: [id number] Patient Account Number: [id number] Date of Birth/Sex: 05-Sep-1946 (75 y.o. F) Treating RN: Dolan Amen Primary Care Jaleisa Brose: Miguel Aschoff Other Clinician: Jacqulyn Bath Referring Clennon Nasca: Miguel Aschoff Treating Zoriah Pulice/Extender: Skipper Cliche in Treatment: 8 Vital [name] Time Taken: 08:55 Temperature (F): 98.5 Height (in): 61.5 Pulse (bpm): 72 Weight (lbs): 178 Respiratory Rate (breaths/min): 16 Body Mass Index (BMI): 33.1 Blood Pressure (mmHg): 140/60 Reference Range: 80 - 120 mg / dl Electronic Signature(s) Signed: 07/10/2021 11:40:24 AM By: Enedina Finner RCP, RRT, CHT Entered By: Enedina Finner on 07/10/2021 09:00:34

## 2021-07-11 ENCOUNTER — Other Ambulatory Visit: Payer: Self-pay

## 2021-07-11 ENCOUNTER — Encounter: Payer: PPO | Admitting: Physician Assistant

## 2021-07-11 DIAGNOSIS — L598 Other specified disorders of the skin and subcutaneous tissue related to radiation: Secondary | ICD-10-CM | POA: Diagnosis not present

## 2021-07-11 NOTE — Progress Notes (Addendum)
Melissa, [name] ([id number]) Visit Report for 07/11/2021 HBO Details Patient Name: Melissa [name], Melissa [name]. Date of Service: 07/11/2021 9:00 AM Medical Record Number: [id number] Patient Account Number: [id number] Date of Birth/Sex: [birth year] (75 y.o. 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5 1 Clock Time (24 hr) 08:51 09:06 09:36 09:41 10:12 10:17 - - 10:47 11:02 Treatment Length: 131 (minutes) Treatment Segments: 4 Vital Signs Capillary Blood Glucose Reference Range: 80 - 120 mg / dl HBO Diabetic Blood Glucose Intervention Range: <131 mg/dl or >249 mg/dl Time Vitals Blood Respiratory Capillary Blood Glucose Pulse Action Type: Pulse: Temperature: Taken: Pressure: Rate: Glucose (mg/dl): Meter #: Oximetry (%) Taken: Pre 08:45 140/62 72 16 98.4 Post 11:05 122/70 72 16 98.4 Treatment Response Treatment Toleration: Well Treatment Completion Treatment Completed without Adverse Event Status: Electronic Signature(s) Signed:  07/22/2021 1:53:03 PM By: Gretta Cool, BSN, RN, CWS, Kim RN, BSN Signed: 08/06/2021 5:26:25 PM By: Worthy Keeler PA-C Previous Signature: 07/11/2021 12:15:31 PM Version By: Enedina Finner RCP, RRT, CHT Previous Signature: 07/11/2021 6:21:30 PM Version By: Worthy Keeler PA-C Entered By: Gretta Cool, BSN, RN, CWS, Kim on 07/22/2021 13:53:02 CHARLIANN, MEHRENS ([id number]) -------------------------------------------------------------------------------- HBO Safety Checklist Details Patient Name: Melissa, [name]. Date of Service: 07/11/2021 9:00 AM Medical Record Number: [id number] Patient Account Number: [id number] Date of Birth/Sex: Jun 04, 1946 (75 y.o. F) Treating RN: Carlene Coria Primary Care Truda Staub: Miguel Aschoff Other Clinician: Jacqulyn Bath Referring Meenakshi Sazama: Miguel Aschoff Treating Wilhelmina Hark/Extender: Skipper Cliche in Treatment: 8 HBO Safety Checklist Items Safety Checklist Consent Form Signed Patient voided / foley secured and emptied When did you last eato 07:00 am Last dose of injectable or oral agent na Ostomy pouch emptied and vented if applicable NA All implantable devices assessed, documented and approved NA Intravenous access site secured and place NA Valuables secured Linens and cotton and cotton/polyester blend (less than 51% polyester) Personal oil-based products / skin lotions / body lotions removed Wigs or hairpieces removed NA Smoking or tobacco materials removed NA Books / newspapers / magazines / loose paper removed NA Cologne, aftershave, perfume and deodorant removed Jewelry removed (may wrap wedding band) Make-up removed Hair care products removed Battery operated devices (external) removed NA Heating patches and chemical warmers removed NA Titanium eyewear removed NA Nail polish cured greater than 10 hours NA Casting material cured greater than 10 hours NA Hearing aids removed NA Loose dentures or partials  removed NA Prosthetics have been removed NA Patient demonstrates correct use of air break device (if applicable) Patient concerns have been addressed Patient grounding bracelet on and cord attached to chamber Specifics for Inpatients (complete in addition to above) Medication sheet sent with patient Intravenous medications needed or due during therapy  sent with patient Drainage tubes (e.g. nasogastric tube or chest tube secured and vented) Endotracheal or Tracheotomy tube secured Cuff deflated of air and inflated with saline Airway suctioned Electronic Signature(s) Signed: 07/11/2021 12:15:31 PM By: Enedina Finner RCP, RRT, CHT Entered By: Enedina Finner on 07/11/2021 09:07:27

## 2021-07-11 NOTE — Progress Notes (Signed)
REYLIN, ARCAND ([id number]) Visit Report for 07/10/2021 HBO Details Patient Name: Melissa [name], Melissa [name]. Date of Service: 07/10/2021 9:00 AM Medical Record Number: [id number] Patient Account Number: [id number] Date of Birth/Sex: 1946-04-04 (75 y.o. 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 HBO Treatment Details Treatment Number: 27 Patient Type: Outpatient Chamber Type: Monoplace Chamber Serial #: [id number] Treatment Protocol: 2.5 ATA with 90 minutes oxygen, with two 5 minute air breaks Treatment Details Compression Rate Down: 1.5 psi / minute De-Compression Rate Up: 1.5 psi / minute Compress Tx Pressure Air breaks and breathing periods Decompress Decompress Begins Reached (leave unused spaces blank) Begins Ends Chamber Pressure (ATA) 1 2.5 2.5 2.5 2.5 2.5 - - 2.5 1 Clock Time (24 hr) 08:59 09:14 09:44 09:49 10:20 10:25 - - 10:55 11:11 Treatment Length: 132 (minutes) Treatment Segments: 4 Vital [name] Capillary Blood Glucose Reference Range: 80 - 120 mg / dl HBO Diabetic Blood Glucose Intervention Range: <131 mg/dl or >249 mg/dl Time Vitals Blood Respiratory Capillary Blood Glucose Pulse Action Type: Pulse: Temperature: Taken: Pressure: Rate: Glucose (mg/dl): Meter #: Oximetry (%) Taken: Pre 08:55 140/60 72 16 98.5 Post 11:15 122/66 72 16 98.5 Treatment Response Treatment Toleration: Well Treatment Completion Treatment Completed without Adverse Event Status: Electronic Signature(s) Signed: 07/10/2021 11:40:24 AM By: Enedina Finner RCP, RRT, CHT Signed: 07/10/2021 9:44:25 PM By: Worthy Keeler  PA-C Entered By: Stark Jock, Amado Nash on 07/10/2021 11:36:44 Melissa [name] ([id number]) -------------------------------------------------------------------------------- HBO Safety Checklist Details Patient Name: Melissa [name]. Date of Service: 07/10/2021 9:00 AM Medical Record Number: [id number] Patient Account Number: [id number] Date of Birth/Sex: 1946/01/22 (75 y.o. F) Treating RN: Dolan Amen Primary Care Tiona Ruane: Miguel Aschoff Other Clinician: Jacqulyn Bath Referring Nekeshia Lenhardt: Miguel Aschoff Treating Irl Bodie/Extender: Skipper Cliche in Treatment: 8 HBO Safety Checklist Items Safety Checklist Consent Form Signed Patient voided / foley secured and emptied When did you last eato 07:00 AM Last dose of injectable or oral agent na Ostomy pouch emptied and vented if applicable NA All implantable devices assessed, documented and approved NA Intravenous access site secured and place NA Valuables secured Linens and cotton and cotton/polyester blend (less than 51% polyester) Personal oil-based products / skin lotions / body lotions removed Wigs or hairpieces removed NA Smoking or tobacco materials removed NA Books / newspapers / magazines / loose paper removed NA Cologne, aftershave, perfume and deodorant removed Jewelry removed (may wrap wedding band) Make-up removed Hair care products removed Battery operated devices (external) removed NA Heating patches and chemical warmers removed NA Titanium eyewear removed NA Nail polish cured greater than 10 hours NA Casting material cured greater than 10 hours NA Hearing aids removed NA Loose dentures or partials removed NA Prosthetics have been removed NA Patient demonstrates correct use of air break device (if applicable) Patient concerns have been addressed Patient grounding bracelet on and cord attached to chamber Specifics for Inpatients (complete in addition to above) Medication sheet sent  with patient Intravenous medications needed or due during therapy sent with patient Drainage tubes (e.g. nasogastric tube or chest tube secured and vented) Endotracheal or Tracheotomy tube secured Cuff deflated of air and inflated with saline Airway suctioned Electronic Signature(s) Signed: 07/10/2021 11:40:24 AM By: Stark Jock, Sallie RCP, RRT, CHT Entered  By: Enedina Finner on 07/10/2021 [id number]

## 2021-07-11 NOTE — Progress Notes (Signed)
VELETA, GUCK ([id number]) Visit Report for 07/11/2021 Arrival Information Details Patient Name: Melissa [name], Melissa [name]. Date of Service: 07/11/2021 9:00 AM Medical Record Number: [id number] Patient Account Number: [id number] Date of Birth/Sex: 12-13-1945 (75 y.o. F) Treating RN: Carlene Coria Primary Care Marieelena Bartko: Miguel Aschoff Other Clinician: Jacqulyn Bath Referring Taralyn Ferraiolo: Miguel Aschoff Treating Diesel Lina/Extender: Skipper Cliche in Treatment: 8 Visit Information History Since Last Visit Added or deleted any medications: No Patient Arrived: Ambulatory Any new allergies or adverse reactions: No Arrival Time: 08:35 Had a fall or experienced change in No Accompanied By: self activities of daily living that may affect Transfer Assistance: None risk of falls: Patient Identification Verified: Yes [name] or symptoms of abuse/neglect since last visito No Secondary Verification Process Completed: Yes Hospitalized since last visit: No Patient Has Alerts: Yes Implantable device outside of the clinic excluding No Patient Alerts: NOT diabetic cellular tissue based products placed in the center since last visit: Pain Present Now: No Electronic Signature(s) Signed: 07/11/2021 12:15:31 PM By: Enedina Finner RCP, RRT, CHT Entered By: Enedina Finner on 07/11/2021 08:52:23 Melissa [name] ([id number]) -------------------------------------------------------------------------------- Encounter Discharge Information Details Patient Name: Melissa [name] Date of Service: 07/11/2021 9:00 AM Medical Record Number: [id number] Patient Account Number: [id number] Date of Birth/Sex: 05/09/1946 (75 y.o. F) Treating RN: Carlene Coria Primary Care Ocie Tino: Miguel Aschoff Other Clinician: Jacqulyn Bath Referring Mariano Doshi: Miguel Aschoff Treating Anola Mcgough/Extender: Skipper Cliche in Treatment: 8 Encounter Discharge Information Items Discharge Condition:  Stable Ambulatory Status: Ambulatory Discharge Destination: Home Transportation: Private Auto Accompanied By: self Schedule Follow-up Appointment: Yes Clinical Summary of Care: Notes Patient has an HBO treatment scheduled on 07/16/21 at 13:00 pm. Electronic Signature(s) Signed: 07/11/2021 12:15:31 PM By: Enedina Finner RCP, RRT, CHT Entered By: Enedina Finner on 07/11/2021 12:15:14 Melissa [name] ([id number]) -------------------------------------------------------------------------------- Vitals Details Patient Name: Melissa [name] Date of Service: 07/11/2021 9:00 AM Medical Record Number: [id number] Patient Account Number: [id number] Date of Birth/Sex: [birth year] (75 y.o. F) Treating RN: Carlene Coria Primary Care Khristie Sak: Miguel Aschoff Other Clinician: Jacqulyn Bath Referring Ahriana Gunkel: Miguel Aschoff Treating Jamarrius Salay/Extender: Skipper Cliche in Treatment: 8 Vital [name] Time Taken: 08:45 Temperature (F): 98.4 Height (in): 61.5 Pulse (bpm): 72 Weight (lbs): 178 Respiratory Rate (breaths/min): 16 Body Mass Index (BMI): 33.1 Blood Pressure (mmHg): 140/62 Reference Range: 80 - 120 mg / dl Electronic Signature(s) Signed: 07/11/2021 12:15:31 PM By: Enedina Finner RCP, RRT, CHT Entered By: Enedina Finner on 07/11/2021 12:14:09

## 2021-07-14 ENCOUNTER — Ambulatory Visit: Payer: Self-pay | Admitting: Family Medicine

## 2021-07-16 ENCOUNTER — Encounter: Payer: PPO | Admitting: Physician Assistant

## 2021-07-16 ENCOUNTER — Other Ambulatory Visit: Payer: Self-pay

## 2021-07-16 ENCOUNTER — Emergency Department
Admission: EM | Admit: 2021-07-16 | Discharge: 2021-07-17 | Disposition: A | Discharge status: Home / Self Care | Payer: PPO | Attending: Emergency Medicine | Admitting: Emergency Medicine

## 2021-07-16 ENCOUNTER — Encounter: Payer: Self-pay | Admitting: Emergency Medicine

## 2021-07-16 ENCOUNTER — Emergency Department: Payer: PPO

## 2021-07-16 DIAGNOSIS — D61818 Other pancytopenia: Secondary | ICD-10-CM

## 2021-07-16 DIAGNOSIS — Z20822 Contact with and (suspected) exposure to covid-19: Secondary | ICD-10-CM | POA: Diagnosis not present

## 2021-07-16 DIAGNOSIS — R0600 Dyspnea, unspecified: Secondary | ICD-10-CM | POA: Diagnosis not present

## 2021-07-16 DIAGNOSIS — Z79899 Other long term (current) drug therapy: Secondary | ICD-10-CM | POA: Diagnosis not present

## 2021-07-16 DIAGNOSIS — C218 Malignant neoplasm of overlapping sites of rectum, anus and anal canal: Secondary | ICD-10-CM | POA: Diagnosis not present

## 2021-07-16 DIAGNOSIS — N3289 Other specified disorders of bladder: Secondary | ICD-10-CM | POA: Diagnosis not present

## 2021-07-16 DIAGNOSIS — I700 Atherosclerosis of aorta: Secondary | ICD-10-CM | POA: Diagnosis not present

## 2021-07-16 DIAGNOSIS — R0602 Shortness of breath: Secondary | ICD-10-CM | POA: Diagnosis not present

## 2021-07-16 DIAGNOSIS — D649 Anemia, unspecified: Secondary | ICD-10-CM | POA: Insufficient documentation

## 2021-07-16 DIAGNOSIS — K7689 Other specified diseases of liver: Secondary | ICD-10-CM | POA: Diagnosis not present

## 2021-07-16 DIAGNOSIS — Z90710 Acquired absence of both cervix and uterus: Secondary | ICD-10-CM | POA: Diagnosis not present

## 2021-07-16 DIAGNOSIS — E039 Hypothyroidism, unspecified: Secondary | ICD-10-CM | POA: Diagnosis not present

## 2021-07-16 DIAGNOSIS — R000 Tachycardia, unspecified: Secondary | ICD-10-CM | POA: Diagnosis not present

## 2021-07-16 DIAGNOSIS — D696 Thrombocytopenia, unspecified: Secondary | ICD-10-CM | POA: Insufficient documentation

## 2021-07-16 DIAGNOSIS — I959 Hypotension, unspecified: Secondary | ICD-10-CM | POA: Diagnosis not present

## 2021-07-16 LAB — CBC WITH DIFFERENTIAL/PLATELET
Abs Immature Granulocytes: 1.86 10*3/uL — ABNORMAL HIGH (ref 0.00–0.07)
Basophils Absolute: 0.4 10*3/uL — ABNORMAL HIGH (ref 0.0–0.1)
Basophils Relative: 3 %
Eosinophils Absolute: 0 10*3/uL (ref 0.0–0.5)
Eosinophils Relative: 0 %
HCT: 24.5 % — ABNORMAL LOW (ref 36.0–46.0)
Hemoglobin: 7.7 g/dL — ABNORMAL LOW (ref 12.0–15.0)
Immature Granulocytes: 14 %
Lymphocytes Relative: 28 %
Lymphs Abs: 3.7 10*3/uL (ref 0.7–4.0)
MCH: 30.2 pg (ref 26.0–34.0)
MCHC: 31.4 g/dL (ref 30.0–36.0)
MCV: 96.1 fL (ref 80.0–100.0)
Monocytes Absolute: 5 10*3/uL — ABNORMAL HIGH (ref 0.1–1.0)
Monocytes Relative: 37 %
Neutro Abs: 2.4 10*3/uL (ref 1.7–7.7)
Neutrophils Relative %: 18 %
Platelets: 17 10*3/uL — CL (ref 150–400)
RBC: 2.55 MIL/uL — ABNORMAL LOW (ref 3.87–5.11)
RDW: 17.7 % — ABNORMAL HIGH (ref 11.5–15.5)
Smear Review: DECREASED
WBC: 13.4 10*3/uL — ABNORMAL HIGH (ref 4.0–10.5)
nRBC: 0.7 % — ABNORMAL HIGH (ref 0.0–0.2)

## 2021-07-16 LAB — IRON AND TIBC
Iron: 49 ug/dL (ref 28–170)
Saturation Ratios: 14 % (ref 10.4–31.8)
TIBC: 351 ug/dL (ref 250–450)
UIBC: 302 ug/dL

## 2021-07-16 LAB — COMPREHENSIVE METABOLIC PANEL
ALT: 16 U/L (ref 0–44)
AST: 26 U/L (ref 15–41)
Albumin: 3.6 g/dL (ref 3.5–5.0)
Alkaline Phosphatase: 83 U/L (ref 38–126)
Anion gap: 9 (ref 5–15)
BUN: 19 mg/dL (ref 8–23)
CO2: 23 mmol/L (ref 22–32)
Calcium: 8.9 mg/dL (ref 8.9–10.3)
Chloride: 106 mmol/L (ref 98–111)
Creatinine, Ser: 1.1 mg/dL — ABNORMAL HIGH (ref 0.44–1.00)
GFR, Estimated: 52 mL/min — ABNORMAL LOW (ref >60)
Glucose, Bld: 135 mg/dL — ABNORMAL HIGH (ref 70–99)
Potassium: 3.9 mmol/L (ref 3.5–5.1)
Sodium: 138 mmol/L (ref 135–145)
Total Bilirubin: 0.6 mg/dL (ref 0.3–1.2)
Total Protein: 7.4 g/dL (ref 6.5–8.1)

## 2021-07-16 LAB — RESP PANEL BY RT-PCR (FLU A&B, COVID) ARPGX2
Influenza A by PCR: NEGATIVE
Influenza B by PCR: NEGATIVE
SARS Coronavirus 2 by RT PCR: NEGATIVE

## 2021-07-16 LAB — FERRITIN: Ferritin: 857 ng/mL — ABNORMAL HIGH (ref 11–307)

## 2021-07-16 LAB — VITAMIN B12: Vitamin B-12: 769 pg/mL (ref 180–914)

## 2021-07-16 LAB — FIBRINOGEN: Fibrinogen: 263 mg/dL (ref 210–475)

## 2021-07-16 LAB — FOLATE: Folate: 27 ng/mL (ref >5.9)

## 2021-07-16 LAB — APTT: aPTT: 39 seconds — ABNORMAL HIGH (ref 24–36)

## 2021-07-16 LAB — LACTATE DEHYDROGENASE: LDH: 597 U/L — ABNORMAL HIGH (ref 98–192)

## 2021-07-16 LAB — PROTIME-INR
INR: 1.3 — ABNORMAL HIGH (ref 0.8–1.2)
Prothrombin Time: 15.7 seconds — ABNORMAL HIGH (ref 11.4–15.2)

## 2021-07-16 LAB — PREPARE RBC (CROSSMATCH)

## 2021-07-16 LAB — PATHOLOGIST SMEAR REVIEW

## 2021-07-16 MED ORDER — IOHEXOL 350 MG/ML SOLN
80.0000 mL | Freq: Once | INTRAVENOUS | Status: AC | PRN
  Administered 2021-07-16: 80 mL via INTRAVENOUS
  Filled 2021-07-16: qty 80

## 2021-07-16 MED ORDER — ALBUTEROL SULFATE (2.5 MG/3ML) 0.083% IN NEBU: 3.0000 mL | INHALATION_SOLUTION | RESPIRATORY_TRACT | Status: DC | PRN

## 2021-07-16 MED ORDER — SODIUM CHLORIDE 0.9 % IV SOLN
10.0000 mL/h | Freq: Once | INTRAVENOUS | Status: AC
  Administered 2021-07-16: 10 mL/h via INTRAVENOUS

## 2021-07-16 NOTE — ED Triage Notes (Signed)
Patient to ED via [facility] from treatment from the hyperbaric chamber. Patient is c/o SOB since Sunday worse today. Patient able to speak in full sentences but tearful in triage.

## 2021-07-16 NOTE — ED Notes (Signed)
Pt assisted to the bathroom via w/c

## 2021-07-16 NOTE — ED Notes (Signed)
Pt states that she has been having sob for the past few days that is getting worse. 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

## 2021-07-16 NOTE — Progress Notes (Signed)
Melissa, [name] ([id number]) Visit Report for 07/16/2021 Chief Complaint Document Details Patient Name: Melissa, [name]. Date of Service: 07/16/2021 1:00 PM Medical Record Number: [id number] Patient Account Number: [id number] Date of Birth/Sex: [birth year] (75 y.o. F) Treating RN: Primary Care Keil Pickering: Miguel Aschoff Other Clinician: Referring Si Jachim: Miguel Aschoff Treating Seleny Allbright/Extender: Suella Grove in Treatment: 8 Information Obtained from: Patient Chief Complaint Soft tissue radionecrosis perineal region Electronic Signature(s) Signed: 07/16/2021 1:27:05 PM By: Worthy Keeler PA-C Entered By: Worthy Keeler on 07/16/2021 13:27:05 Melissa [name] ([id number]) -------------------------------------------------------------------------------- HPI Details Patient Name: Melissa [name] Date of Service: 07/16/2021 1:00 PM Medical Record Number: [id number] Patient Account Number: [id number] Date of Birth/Sex: 1946-03-21 (75 y.o. 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 She had chemotherapy and radiation at that time. Fortunately they did get everything to clear unfortunately she had a quantitative damage and subsequent to the radiation therapy in particular. This led to some radiation damage to the region. Subsequently in the past several months she has developed a irritated area in the perineal region posterior to the vaginal opening which is extremely painful. She was actually seen by Dr. Amalia Hailey on 12/17/2020. She was also seen by [name] her surgeon and determined no further work-up or excision was warranted. She therefore felt that this may be a  radiation treatment issue and she has been using clobetasol on a regular basis initially. She was using Vagifem tablets and does not really feel any different to the way. Right now she tells me she is actually using a hemorrhoid cream in order to help as far as some lidocaine is concerned with numbing the area. With all that being said the patient was subsequently seen on 05/08/2021 by her gynecologic oncology physician Dr. Mellody Drown. Subsequently it is noted that the patient is a not a smoker and has never done so. She also does not drink or consume alcohol and does not use any illicit drugs. She has no heart disease and no lung disease. With all that being said the patient is of course having a significant issue here with her pain. Looking through her history again she did have radiation therapy which is stated to be the causative agent for what is going on at this point that seems to be the consensus among her physicians including Dr. 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 With that being said he did obtain a sample for pathology just to ensure there was no obvious [name] of recurrence. 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 Therefore the patient was referred to [place] for evaluation and treatment with regard to hyperbaric oxygen therapy any plan to see the patient back for a follow-up visit once HBO therapy is complete. With regard to her original treatment with regard to the radiation therapy I am still attempting to obtain those records in particular that was from 2004 that is proving to be a little bit more difficult to get my hands on to  be honest. 07/16/2021 upon evaluation today patient unfortunately came in for hyperbaric  oxygen therapy treatment however she was very short of breath with dyspnea on exertion noted. She tells me that her chest feels very tight when she walks even from room 1 room to another this has gotten worse when she is not been in the chamber over the past several days. That was from last Friday till today. Overall my concern currently is that there may be something going on with her heart that is the main issue here to be honest. Electronic Signature(s) Signed: 07/16/2021 1:28:01 PM By: Worthy Keeler PA-C Entered By: Worthy Keeler on 07/16/2021 13:28:00 Melissa [name] ([id number]) -------------------------------------------------------------------------------- Physical Exam Details Patient Name: Melissa [name]. Date of Service: 07/16/2021 1:00 PM Medical Record Number: [id number] Patient Account Number: [id number] Date of Birth/Sex: [birth year] (75 y.o. F) Treating RN: Primary Care Alizabeth Antonio: Miguel Aschoff Other Clinician: Referring Hannelore Bova: Miguel Aschoff Treating Welda Azzarello/Extender: Suella Grove in Treatment: 8 Constitutional Well-nourished and well-hydrated in no acute distress. Respiratory normal breathing without difficulty. Psychiatric this patient is able to make decisions and demonstrates good insight into disease process. Alert and Oriented x 3. pleasant and cooperative. Notes Upon inspection patient's breath sounds actually appear to be good I did not hear any wheezing, rhonchi, or other abnormalities in this regard. With that being said I did actually hooked her up to the pulse ox machine and had a walk with me. When she was sitting her oxygen saturation was at 99% and her pulse was right around 89-90. When I got her to get up and move around unfortunately this dropped to 94% oxygen saturation and her pulse went up to 118 and she began to actually cry due to having trouble catching her breath. I therefore did not push it any further we were walking very slowly during  this time not fast at all and I had her come sit back down at which point were able to stabilize that she still with shortness of breath. Electronic Signature(s) Signed: 07/16/2021 1:29:05 PM By: Worthy Keeler PA-C Entered By: Worthy Keeler on 07/16/2021 13:29:05 Melissa [name] ([id number]) -------------------------------------------------------------------------------- Problem List Details Patient Name: Melissa [name] Date of Service: 07/16/2021 1:00 PM Medical Record Number: [id number] Patient Account Number: [id number] Date of Birth/Sex: Mar 09, 1946 (75 y.o. 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 She had chemotherapy and radiation at that time. Fortunately they did get everything  to clear unfortunately she had a quantitative damage and subsequent to the radiation therapy in particular. This led to some radiation damage to the region. Subsequently in the past several months she has developed a irritated area in the perineal region posterior to the vaginal opening which is extremely painful. She was actually seen by Dr. Amalia Hailey on 12/17/2020. She was also seen by [name] her surgeon and determined no further work-up or excision was warranted. She therefore felt that this may be a radiation treatment issue and she has been using clobetasol on a regular basis initially. She was using Vagifem tablets and does not really feel any different to the way. Right now she tells me she is actually using a hemorrhoid cream in order to help as far as some lidocaine is concerned with numbing the area. With all that being said the patient was subsequently seen on 05/08/2021 by her gynecologic oncology physician Dr. Mellody Drown. Subsequently it is noted that the patient is a not a smoker and has never done so. She also does not drink or consume alcohol and does not use any illicit drugs. She has no heart disease and no lung disease. With all that being said the patient is of course having a significant issue here with her pain. Looking through her history again she did have radiation therapy which is stated to be the causative agent for what is going on at this point that seems to be the consensus among her physicians including Dr. 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 With that being said he did obtain a  sample for pathology just to ensure there was no obvious [name] of recurrence. 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 Therefore the patient was referred to [place] for evaluation and treatment with regard to hyperbaric oxygen therapy any plan to see the patient back for a follow-up visit once HBO therapy is complete. With regard to her original treatment with regard to the radiation therapy I am still attempting to obtain those records in particular that was from 2004 that is proving to be a little bit more difficult to get my hands on to be honest. 07/16/2021 upon evaluation today patient unfortunately came in for hyperbaric oxygen therapy treatment however she was very short of breath with dyspnea on exertion noted. She tells me that her chest feels very tight when she walks even from room 1 room to another this has gotten worse when she is not been in the chamber over the past several days. That was from last Friday till today. Overall my concern currently is that there may be something going on with her heart that is the main issue here to be honest. Objective Constitutional Well-nourished and well-hydrated in no acute distress. Vitals Time Taken: 1:30 AM, Height: 61.5 in, Weight: 178 lbs, BMI: 33.1, Temperature: 98.3 F, Pulse: 102 bpm, Respiratory Rate: 24 breaths/min, Blood Pressure: 144/75 mmHg, Pulse Oximetry: 93 %. Respiratory normal breathing without difficulty. Psychiatric this patient is able to make decisions and demonstrates good insight into disease process. Alert and Oriented x 3. pleasant and cooperative. General Notes: Upon inspection patient's breath sounds actually appear to be good I did not hear any wheezing, rhonchi, or other abnormalities in  this regard. With that being said I did actually hooked her up to the pulse ox machine and had a walk with me. When she was sitting her oxygen saturation was at 99%  and her pulse was right around 89-90. When I got her to get up and move around unfortunately this dropped to 94% oxygen saturation and her pulse went up to 118 and she began to actually cry due to having trouble catching her breath. I therefore did not push it any further we were walking very slowly during this time not fast at all and I had her come sit back down at which point were able to stabilize that she still with shortness of breath. Melissa [name], Melissa [name] ([id number]) Assessment Active Problems ICD-10 Other specified disorders of the skin and subcutaneous tissue related to radiation Personal history of other malignant neoplasm of rectum, rectosigmoid junction, and anus Shortness of breath Plan 1. I would recommend currently the patient should go to the ER for further evaluation and treatment with a call EMS to transport her as I feel like that is going to be the optimal way to go and she is in agreement with that plan. We need to get things checked out ASAP. 2. I am also can recommend currently that we have the patient hold off on hyperbarics today obviously I think that that is definitely not what we need to focus on at the moment. We need to figure out what is going on with her shortness of breath and dyspnea on exertion. We will see the patient back for follow-up evaluation after her evaluation at the hospital. Electronic Signature(s) Signed: 07/16/2021 1:32:50 PM By: Worthy Keeler PA-C Previous Signature: 07/16/2021 1:29:50 PM Version By: Worthy Keeler PA-C Entered By: Worthy Keeler on 07/16/2021 13:32:50 Melissa [name] ([id number]) -------------------------------------------------------------------------------- SuperBill Details Patient Name: Melissa [name] Date of Service: 07/16/2021 Medical Record Number: [id number] Patient Account Number: [id number] Date of Birth/Sex: 07/13/1946 (75 y.o. F) Treating RN: Primary Care Abrie Egloff: Miguel Aschoff Other  Clinician: Referring Marjoria Mancillas: Miguel Aschoff Treating Andriea Hasegawa/Extender: Suella Grove in Treatment: 8 Diagnosis Coding ICD-10 Codes Code Description L59.8 Other specified disorders of the skin and subcutaneous tissue related to radiation Z85.048 Personal history of other malignant neoplasm of rectum, rectosigmoid junction, and anus R06.02 Shortness of breath Physician Procedures CPT4 Code: [id number] Description: [id number] - WC PHYS LEVEL 4 - EST PT Modifier: Quantity: 1 CPT4 Code: Description: ICD-10 Diagnosis Description L59.8 Other specified disorders of the skin and subcutaneous tissue related Z85.048 Personal history of other malignant neoplasm of rectum, rectosigmoid R06.02 Shortness of breath Modifier: to radiation junction, and anus Quantity: Electronic Signature(s) Signed: 07/16/2021 1:30:00 PM By: Worthy Keeler PA-C Entered By: Worthy Keeler on 07/16/2021 13:29:59

## 2021-07-16 NOTE — Progress Notes (Addendum)
SHAQUASIA, KANESHIRO ([id number]) Visit Report for 07/16/2021 Arrival Information Details Patient Name: Melissa [name], Melissa [name]. Date of Service: 07/16/2021 1:00 PM Medical Record Number: [id number] Patient Account Number: [id number] Date of Birth/Sex: January 28, 1946 (75 y.o. F) Treating RN: Primary Care Markiah Janeway: Miguel Aschoff Other Clinician: Referring Johnathan Tortorelli: Miguel Aschoff Treating Ralphie Lovelady/Extender: Suella Grove in Treatment: 8 Visit Information History Since Last Visit Pain Present Now: No Patient Arrived: Ambulatory Arrival Time: 13:30 Accompanied By: self Transfer Assistance: None Patient Has Alerts: Yes Patient Alerts: NOT diabetic Electronic Signature(s) Signed: 07/16/2021 1:30:23 PM By: Worthy Keeler PA-C Entered By: Worthy Keeler on 07/16/2021 13:30:23 Melissa [name] ([id number]) -------------------------------------------------------------------------------- Clinic Level of Care Assessment Details Patient Name: Melissa [name] Date of Service: 07/16/2021 1:00 PM Medical Record Number: [id number] Patient Account Number: [id number] Date of Birth/Sex: August 25, 1946 (75 y.o. F) Treating RN: Cornell Barman Primary Care Elowyn Raupp: Miguel Aschoff Other Clinician: Referring Dayle Sherpa: Miguel Aschoff Treating Jenniferann Stuckert/Extender: Suella Grove in Treatment: 8 Clinic Level of Care Assessment Items TOOL 4 Quantity Score '[]'$  - Use when only an EandM is performed on FOLLOW-UP visit 0 ASSESSMENTS - Nursing Assessment / Reassessment '[]'$  - Reassessment of Co-morbidities (includes updates in patient status) 0 '[]'$  - 0 Reassessment of Adherence to Treatment Plan ASSESSMENTS - Wound and Skin Assessment / Reassessment '[]'$  - Simple Wound Assessment / Reassessment - one wound 0 '[]'$  - 0 Complex Wound Assessment / Reassessment - multiple wounds '[]'$  - 0 Dermatologic / Skin Assessment (not related to wound area) ASSESSMENTS - Focused Assessment '[]'$  - Circumferential Edema Measurements - multi extremities  0 '[]'$  - 0 Nutritional Assessment / Counseling / Intervention '[]'$  - 0 Lower Extremity Assessment (monofilament, tuning fork, pulses) '[]'$  - 0 Peripheral Arterial Disease Assessment (using hand held doppler) ASSESSMENTS - Ostomy and/or Continence Assessment and Care '[]'$  - Incontinence Assessment and Management 0 '[]'$  - 0 Ostomy Care Assessment and Management (repouching, etc.) PROCESS - Coordination of Care '[]'$  - Simple Patient / Family Education for ongoing care 0 '[]'$  - 0 Complex (extensive) Patient / Family Education for ongoing care '[]'$  - 0 Staff obtains Programmer, systems, Records, Test Results / Process Orders '[]'$  - 0 Staff telephones HHA, Nursing Homes / Clarify orders / etc '[]'$  - 0 Routine Transfer to another Facility (non-emergent condition) '[]'$  - 0 Routine Hospital Admission (non-emergent condition) '[]'$  - 0 New Admissions / Biomedical engineer / Ordering NPWT, Apligraf, etc. '[]'$  - 0 Emergency Hospital Admission (emergent condition) X- 1 10 Simple Discharge Coordination '[]'$  - 0 Complex (extensive) Discharge Coordination PROCESS - Special Needs '[]'$  - Pediatric / Minor Patient Management 0 '[]'$  - 0 Isolation Patient Management '[]'$  - 0 Hearing / Language / Visual special needs '[]'$  - 0 Assessment of Community assistance (transportation, D/C planning, etc.) '[]'$  - 0 Additional assistance / Altered mentation '[]'$  - 0 Support Surface(s) Assessment (bed, cushion, seat, etc.) INTERVENTIONS - Wound Cleansing / Measurement Melissa [name], Melissa R. ([id number]) '[]'$  - 0 Simple Wound Cleansing - one wound '[]'$  - 0 Complex Wound Cleansing - multiple wounds '[]'$  - 0 Wound Imaging (photographs - any number of wounds) '[]'$  - 0 Wound Tracing (instead of photographs) '[]'$  - 0 Simple Wound Measurement - one wound '[]'$  - 0 Complex Wound Measurement - multiple wounds INTERVENTIONS - Wound Dressings '[]'$  - Small Wound Dressing one or multiple wounds 0 '[]'$  - 0 Medium Wound Dressing one or multiple wounds '[]'$  - 0 Large  Wound Dressing one or multiple wounds '[]'$  - 0 Application of Medications - topical '[]'$  - 0 Application of Medications -  injection INTERVENTIONS - Miscellaneous '[]'$  - External ear exam 0 '[]'$  - 0 Specimen Collection (cultures, biopsies, blood, body fluids, etc.) '[]'$  - 0 Specimen(s) / Culture(s) sent or taken to Lab for analysis '[]'$  - 0 Patient Transfer (multiple staff / Civil Service fast streamer / Similar devices) '[]'$  - 0 Simple Staple / Suture removal (25 or less) '[]'$  - 0 Complex Staple / Suture removal (26 or more) '[]'$  - 0 Hypo / Hyperglycemic Management (close monitor of Blood Glucose) '[]'$  - 0 Ankle / Brachial Index (ABI) - do not check if billed separately X- 1 5 Vital [name] Has the patient been seen at the hospital within the last three years: Yes Total Score: 15 Level Of Care: New/Established - Level 1 Electronic Signature(s) Unsigned Entered By: Gretta Cool, BSN, RN, CWS, Kim on 07/18/2021 13:12:28 Signature(s): Date(s): Melissa [name] ([id number]) -------------------------------------------------------------------------------- Encounter Discharge Information Details Patient Name: Melissa [name], Melissa [name]. Date of Service: 07/16/2021 1:00 PM Medical Record Number: [id number] Patient Account Number: [id number] Date of Birth/Sex: [birth year] (75 y.o. F) Treating RN: Cornell Barman Primary Care Traevion Poehler: Miguel Aschoff Other Clinician: Referring Sylvio Weatherall: Miguel Aschoff Treating Keiron Iodice/Extender: Suella Grove in Treatment: 8 Encounter Discharge Information Items Discharge Condition: Unstable Ambulatory Status: Stretcher Discharge Destination: Hospital Orders Sent: Yes Transportation: Ambulance Accompanied By: EMS Schedule Follow-up Appointment: Yes Clinical Summary of Care: Electronic Signature(s) Signed: 07/18/2021 1:13:07 PM By: Gretta Cool, BSN, RN, CWS, Kim RN, BSN Entered By: Gretta Cool, BSN, RN, CWS, Kim on 07/18/2021 13:13:07 Melissa [name]  ([id number]) -------------------------------------------------------------------------------- Lower Extremity Assessment Details Patient Name: Melissa [name] Date of Service: 07/16/2021 1:00 PM Medical Record Number: [id number] Patient Account Number: [id number] Date of Birth/Sex: [birth year] (75 y.o. F) Treating RN: Cornell Barman Primary Care Jecenia Leamer: Miguel Aschoff Other Clinician: Referring Bhakti Labella: Miguel Aschoff Treating Maddix Heinz/Extender: Suella Grove in Treatment: 8 Electronic Signature(s) Signed: 07/18/2021 1:11:00 PM By: Gretta Cool, BSN, RN, CWS, Kim RN, BSN Entered By: Gretta Cool, BSN, RN, CWS, Kim on 07/18/2021 13:11:00 Melissa [name] ([id number]) -------------------------------------------------------------------------------- Pain Assessment Details Patient Name: Melissa [name] Date of Service: 07/16/2021 1:00 PM Medical Record Number: [id number] Patient Account Number: [id number] Date of Birth/Sex: 1946/04/24 (75 y.o. F) Treating RN: Cornell Barman Primary Care Emberlyn Burlison: Miguel Aschoff Other Clinician: Referring Zacherie Honeyman: Miguel Aschoff Treating Jermika Olden/Extender: Suella Grove in Treatment: 8 Active Problems Location of Pain Severity and Description of Pain Patient Has Paino Yes Site Locations Pain Management and Medication Current Pain Management: Notes Patient has chronic pain. Electronic Signature(s) Signed: 07/18/2021 1:10:49 PM By: Gretta Cool, BSN, RN, CWS, Kim RN, BSN Entered By: Gretta Cool, BSN, RN, CWS, Kim on 07/18/2021 13:10:49 Melissa [name] ([id number]) -------------------------------------------------------------------------------- [place] Details Patient Name: Melissa [name] Date of Service: 07/16/2021 1:00 PM Medical Record Number: [id number] Patient Account Number: [id number] Date of Birth/Sex: 1946/11/21 (75 y.o. F) Treating RN: Primary Care Tenise Stetler: Miguel Aschoff Other Clinician: Referring Baylie Drakes: Miguel Aschoff Treating Rasmus Preusser/Extender: Suella Grove in  Treatment: 8 Vital [name] Time Taken: 01:30 Temperature (F): 98.3 Height (in): 61.5 Pulse (bpm): 102 Weight (lbs): 178 Respiratory Rate (breaths/min): 24 Body Mass Index (BMI): 33.1 Blood Pressure (mmHg): 144/75 Reference Range: 80 - 120 mg / dl Airway Pulse Oximetry (%): 93 Electronic Signature(s) Signed: 07/16/2021 1:32:07 PM By: Worthy Keeler PA-C Entered By: Worthy Keeler on 07/16/2021 13:32:07

## 2021-07-16 NOTE — ED Triage Notes (Signed)
First nurse note: Pt comes ems from [facility] hyperbaric chamber. Came in this morning with SOB with exertion. Pt was placed on 2L [place] and has not improved. VSS. 98% on the 2L [place]. Normally not on any. Aox4

## 2021-07-16 NOTE — ED Notes (Signed)
Patient stated she was having chest pain at this time. Pain 3/10 will notify nurse if pain gets worse.

## 2021-07-16 NOTE — Discharge Instructions (Signed)
Your CT scan of the chest and abdomen today does not reveal any acute issues.  Your shortness of breath appears to be due to anemia, and we gave you a blood transfusion to improve your symptoms. Please follow up with hematology as soon as possible for further evaluation.

## 2021-07-16 NOTE — ED Provider Notes (Signed)
[facility] Emergency Department Provider Note  ____________________________________________  Time seen: Approximately 5:59 PM  I have reviewed the triage vital signs and the nursing notes.   HISTORY  Chief Complaint Shortness of Breath    HPI Melissa [name] is a 75 y.o. 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  Denies chest pain.  Shortness of breath is worsened by walking, alleviated with rest.  Constant.  Today it was severe enough that she was unable to do her scheduled hyperbaric treatment, so she was sent to the emergency department.  Patient denies black or bloody stool, no vomiting.  No dizziness or syncope.    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  Chemotherapy and radiation.   Basal cell carcinoma 09/30/2020   upper back, [facility] 11/11/2020   Depression    Diffuse cystic mastopathy 12/30/12   Dry mouth    Fibroid    Fibromyalgia 2006   Hemorrhoids 2006   Hypothyroidism    Migraines 2006   Other nonspecific finding on examination of urine [id number]   Personal history of chemotherapy    skin, rectal   Personal history of radiation therapy    rectal   Sciatica    Tubular adenoma of colon 07/08/2016   Vulvitis 11/22/2015     Patient Active Problem List   Diagnosis Date Noted   Fibromuscular dysplasia of both carotid arteries ([place]) 07/01/2021   Dermatochalasis of eyelid 10/22/2020   Ptosis, both eyelids 10/22/2020   Visual field defect 10/22/2020   Low back pain 07/22/2020   Abdominal pain, chronic, right lower quadrant 01/03/2019   Diarrhea [id number]   Lichen sclerosus [id number]   Vulvitis 11/22/2015   Atrophic vaginitis  04/11/2015   Chronic diarrhea 04/11/2015   CD (contact dermatitis) 04/11/2015   Affective disorder, major 04/11/2015   Bloodgood disease 04/11/2015   Fibrositis 04/11/2015   Acid reflux 04/11/2015   Acquired hypothyroidism 04/11/2015   Hypersomnia 04/11/2015   Internal hemorrhoids 04/11/2015   Muscle ache 04/11/2015   Neuralgia neuritis, sciatic nerve 04/11/2015   Herpes zoster 04/11/2015   Apnea, sleep 04/11/2015   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   Inflammation of urethra 04/11/2015   Fibromyalgia    Diffuse cystic mastopathy    Migraines    Other nonspecific finding on examination of urine    ANKLE PAIN, RIGHT 10/01/2009   CAVUS DEFORMITY OF FOOT, ACQUIRED 10/01/2009   ANKLE SPRAIN, RIGHT 10/01/2009   Hyperlipidemia 11/22/2007   Osteoarthritis 11/22/2007   PLANTAR FASCIITIS, BILATERAL 11/22/2007   History of carcinoma in situ of anal canal 01/12/2003     Past Surgical History:  Procedure Laterality Date   ANAL MASS EXC  01/12/2003   Operative report described the nodular area of invasive squamous cell carcinoma anywhere from the 3-6 o'clock position multiple procedure notes   anal ulcer  04/2016   SQUAMOUS MUCOSA WITH ACUTE ACTIVE ULCER AND REACTIVE HYPERPLASIA   BREAST BIOPSY Bilateral    neg   BREAST SURGERY  2005   BREAST REDUCTION SX Breast Biopsy (left) Neg for cancer   CATARACT EXTRACTION     COLONOSCOPY  2008, 2013   COLONOSCOPY WITH PROPOFOL N/A 07/08/2016   tubular adenoma/ COLONOSCOPY WITH PROPOFOL;  Surgeon:  Seeplaputhur Robinette Haines, MD;  Location: [facility] ENDOSCOPY;  Service: Endoscopy;  Laterality: N/A;   COSMETIC SURGERY  20008   FACIAL   REDUCTION MAMMAPLASTY Bilateral 1998   TONSILLECTOMY  2005   VAGINAL HYSTERECTOMY  1986   Secondary to [place]     Prior to Admission medications   Medication Sig Start Date End Date Taking? Authorizing Provider  acetaminophen (TYLENOL) 650 MG CR tablet Take 650 mg by mouth daily as needed for pain.    [provider]   Bioflavonoid Products (BIOFLEX PO) Take by mouth daily.    [provider]  buPROPion (WELLBUTRIN XL) 150 MG 24 hr tablet TAKE 1 TABLET BY MOUTH EVERY DAY 06/03/21   Jerrol Banana., MD  cholecalciferol (VITAMIN D) 1000 units tablet Take 5,000 Units by mouth daily.     [provider]  cholestyramine (QUESTRAN) 4 g packet DISSOLVE 1 PACKET (4 G TOTAL) IN LIQUID AND TAKE BY MOUTH 2 (TWO) TIMES DAILY. 02/11/21   Jerrol Banana., MD  clobetasol ointment (TEMOVATE) [id number] % Apply 1 application topically 2 (two) times daily. Twice a day for 14 days then daily for 4 weeks 07/08/21   Harlin Heys, MD  diphenhydrAMINE (BENADRYL) 25 MG tablet Take 25 mg by mouth every 6 (six) hours as needed for itching.     [provider]  Estradiol 10 MCG TABS vaginal tablet 1 tab per vagina daily for 2 weeks then twice weekly 06/11/20   Harlin Heys, MD  ibuprofen (ADVIL) 600 MG tablet Take 600 mg by mouth every 6 (six) hours as needed.    [provider]  levothyroxine (SYNTHROID) 175 MCG tablet TAKE 1 TABLET BY MOUTH DAILY BEFORE BREAKFAST 08/14/20   Jerrol Banana., MD  loperamide (IMODIUM) 2 MG capsule Take 2 mg by mouth 4 (four) times daily as needed for diarrhea or loose stools.    [provider]  Multiple Vitamins-Minerals (MULTIVITAL) tablet Take 1 tablet by mouth daily.    [provider]  nabumetone (RELAFEN) 500 MG tablet TAKE 1 TABLET BY MOUTH TWICE A DAY AS NEEDED 05/09/21   Jerrol Banana., MD  Omega-3 Fatty Acids (FISH OIL) 1200 MG CAPS Take by mouth daily.     [provider]  OXYCODONE-ACETAMINOPHEN PO Take 1 tablet by mouth every 4 (four) hours as needed. 5 mg Patient not taking: Reported on 07/01/2021    [provider]  Probiotic Product (DIGESTIVE ADVANTAGE PO) Take by mouth daily.    [provider]  sertraline (ZOLOFT) 100 MG tablet TAKE 1 TABLET BY MOUTH TWICE A DAY 05/09/21   Jerrol Banana., MD     Allergies Pentazocine lactate, Propoxyphene n-acetaminophen, Talwin  [pentazocine], Tramadol hcl, Ciprofloxacin hcl, Darvon  [propoxyphene], Dicloxacillin, and Etodolac   Family History  Problem Relation Age of Onset   Diabetes Father    Cancer Father    Stroke Sister    Heart disease Maternal Grandmother    Breast cancer Paternal Aunt    Ovarian cancer Neg Hx     Social History Social History   Tobacco Use   Smoking status: Never   Smokeless tobacco: Never  Vaping Use   Vaping Use: Never used  Substance Use Topics   Alcohol use: No   Drug use: No    Review of Systems  Constitutional:   No fever or chills.  ENT:   No sore throat. No rhinorrhea. Cardiovascular:   No  chest pain or syncope. Respiratory:   Positive shortness of breath without cough. Gastrointestinal:   Negative for abdominal pain, vomiting and diarrhea.  Musculoskeletal:   Negative for focal pain or swelling All other systems reviewed and are negative except as documented above in ROS and HPI.  ____________________________________________   PHYSICAL EXAM:  VITAL SIGNS: ED Triage Vitals  Enc Vitals Group     BP 07/16/21 1409 (!) 142/58     Pulse Rate 07/16/21 1409 89     Resp 07/16/21 1409 18     Temp 07/16/21 1409 99.3 F (37.4 C)     Temp Source 07/16/21 1409 Oral     SpO2 07/16/21 1409 99 %     Weight 07/16/21 1410 175 lb (79.4 kg)     Height 07/16/21 1410 '5\' 1"'$  (1.549 m)     Head Circumference --      Peak Flow --      Pain Score 07/16/21 1410 0     Pain Loc --      Pain Edu? --      Excl. in [place]? --     Vital signs reviewed, nursing assessments reviewed.   Constitutional:   Alert and oriented. Non-toxic appearance. Eyes:   Conjunctivae are mildly pale. EOMI. PERRL. ENT      Head:   Normocephalic and atraumatic.      Nose:   Wearing a mask.      Mouth/Throat:   Wearing a mask.      Neck:   No meningismus. Full ROM. Hematological/Lymphatic/Immunilogical:    No cervical lymphadenopathy. Cardiovascular:   RRR. Symmetric bilateral radial and DP pulses.  No murmurs. Cap refill less than 2 seconds. Respiratory:   Normal respiratory effort without tachypnea/retractions. Breath sounds are clear and equal bilaterally. No wheezes/rales/rhonchi. Gastrointestinal:   Soft and nontender. Non distended. There is no CVA tenderness.  No rebound, rigidity, or guarding. Genitourinary:   deferred Musculoskeletal:   Normal range of motion in all extremities. No joint effusions.  No lower extremity tenderness.  No edema. Neurologic:   Normal speech and language.  Motor grossly intact. No acute focal neurologic deficits are appreciated.  Skin:    Skin is warm, dry and intact. No rash noted.  No petechiae, purpura, or bullae.  ____________________________________________    LABS (pertinent positives/negatives) (all labs ordered are listed, but only abnormal results are displayed) Labs Reviewed  CBC WITH DIFFERENTIAL/PLATELET - Abnormal; Notable for the following components:      Result Value   WBC 13.4 (*)    RBC 2.55 (*)    Hemoglobin 7.7 (*)    HCT 24.5 (*)    RDW 17.7 (*)    Platelets 17 (*)    nRBC 0.7 (*)    Monocytes Absolute 5.0 (*)    Basophils Absolute 0.4 (*)    Abs Immature Granulocytes 1.86 (*)    All other components within normal limits  COMPREHENSIVE METABOLIC PANEL - Abnormal; Notable for the following components:   Glucose, Bld 135 (*)    Creatinine, Ser 1.10 (*)    GFR, Estimated 52 (*)    All other components within normal limits  RESP PANEL BY RT-PCR (FLU A&B, COVID) [id number]  PATHOLOGIST SMEAR REVIEW  COMP PANEL: LEUKEMIA/LYMPHOMA  INTELLIGEN MYELOID  LACTATE DEHYDROGENASE  FIBRINOGEN  PROTIME-INR  APTT  FERRITIN  IRON AND TIBC  VITAMIN B12  FOLATE  TYPE AND SCREEN  PREPARE RBC (CROSSMATCH)   ____________________________________________   EKG  Interpreted by me Sinus  tachycardia rate 103.  Normal axis and intervals.   Normal QRS ST segments and T waves.  No ischemic changes  ____________________________________________    RADIOLOGY  DG Chest 2 View  Result Date: 07/16/2021 CLINICAL DATA:  Dyspnea with exertion. EXAM: CHEST - 2 VIEW COMPARISON:  May 09, 2015. FINDINGS: The heart size and mediastinal contours are within normal limits. Both lungs are clear. The visualized skeletal structures are unremarkable. IMPRESSION: No active cardiopulmonary disease. Electronically Signed   By: Marijo Conception M.D.   On: 07/16/2021 15:08   CT Angio Chest PE W and/or Wo Contrast  Result Date: 07/16/2021 CLINICAL DATA:  Concern for pulmonary embolism. Receiving hyperbaric wound treatment related to remote pelvic radiation for anal rectal cancer. EXAM: CT ANGIOGRAPHY CHEST CT ABDOMEN AND PELVIS WITH CONTRAST TECHNIQUE: Multidetector CT imaging of the chest was performed using the standard protocol during bolus administration of intravenous contrast. Multiplanar CT image reconstructions and MIPs were obtained to evaluate the vascular anatomy. Multidetector CT imaging of the abdomen and pelvis was performed using the standard protocol during bolus administration of intravenous contrast. CONTRAST:  23m OMNIPAQUE IOHEXOL 350 MG/ML SOLN COMPARISON:  CT 12/25/2014 FINDINGS: CTA CHEST FINDINGS Cardiovascular: No filling defects within the pulmonary arteries to suggest acute pulmonary embolism. No significant vascular findings. Normal heart size. No pericardial effusion. Mediastinum/Nodes: No axillary or supraclavicular adenopathy. No mediastinal or hilar adenopathy. No pericardial fluid. Esophagus normal. Lungs/Pleura: No suspicious pulmonary nodules.  Airways normal Musculoskeletal: No acute osseous abnormality. Review of the MIP images confirms the above findings. CT ABDOMEN and PELVIS FINDINGS Hepatobiliary: Several benign hepatic cysts in central RIGHT liver again demonstrated. Enhancing hepatic lesion. Gallbladder is collapsed.  Pancreas: Pancreas is normal. No ductal dilatation. No pancreatic inflammation. Spleen: Normal spleen Adrenals/urinary tract: Adrenal glands and kidneys are normal. The bladder has uniform thickened wall to 8 mm with enhancing mucosal surface. The serosal surface is hazy. No bladder distension. Stomach/Bowel: Stomach, small bowel, appendix, and cecum are normal. The colon and rectosigmoid colon are normal. Vascular/Lymphatic: Abdominal aorta is normal caliber with atherosclerotic calcification. There is no retroperitoneal or periportal lymphadenopathy. No pelvic lymphadenopathy. Reproductive: Post hysterectomy.  Adnexa unremarkable Other: No perineum abscess identified. No free fluid or abscess in the pelvis Musculoskeletal: No aggressive osseous lesion. Review of the MIP images confirms the above findings. IMPRESSION: Chest Impression: 1. No evidence acute pulmonary embolism. 2. No acute pulmonary parenchymal findings. 3. Abdomen / Pelvis Impression: 1. Bladder wall thickening and hazy serosal surface suggests cystitis. 2. No evidence of abscess in the pelvis. 3. No evidence of metastatic disease in the abdomen pelvis. 4.  Aortic Atherosclerosis (ICD10-I70.0). Electronically Signed   By: SSuzy BouchardM.D.   On: 07/16/2021 18:39   CT ABDOMEN PELVIS W CONTRAST  Result Date: 07/16/2021 CLINICAL DATA:  Concern for pulmonary embolism. Receiving hyperbaric wound treatment related to remote pelvic radiation for anal rectal cancer. EXAM: CT ANGIOGRAPHY CHEST CT ABDOMEN AND PELVIS WITH CONTRAST TECHNIQUE: Multidetector CT imaging of the chest was performed using the standard protocol during bolus administration of intravenous contrast. Multiplanar CT image reconstructions and MIPs were obtained to evaluate the vascular anatomy. Multidetector CT imaging of the abdomen and pelvis was performed using the standard protocol during bolus administration of intravenous contrast. CONTRAST:  879mOMNIPAQUE IOHEXOL 350 MG/ML  SOLN COMPARISON:  CT 12/25/2014 FINDINGS: CTA CHEST FINDINGS Cardiovascular: No filling defects within the pulmonary arteries to suggest acute pulmonary embolism. No significant vascular findings. Normal heart size. No pericardial effusion. Mediastinum/Nodes:  No axillary or supraclavicular adenopathy. No mediastinal or hilar adenopathy. No pericardial fluid. Esophagus normal. Lungs/Pleura: No suspicious pulmonary nodules.  Airways normal Musculoskeletal: No acute osseous abnormality. Review of the MIP images confirms the above findings. CT ABDOMEN and PELVIS FINDINGS Hepatobiliary: Several benign hepatic cysts in central RIGHT liver again demonstrated. Enhancing hepatic lesion. Gallbladder is collapsed. Pancreas: Pancreas is normal. No ductal dilatation. No pancreatic inflammation. Spleen: Normal spleen Adrenals/urinary tract: Adrenal glands and kidneys are normal. The bladder has uniform thickened wall to 8 mm with enhancing mucosal surface. The serosal surface is hazy. No bladder distension. Stomach/Bowel: Stomach, small bowel, appendix, and cecum are normal. The colon and rectosigmoid colon are normal. Vascular/Lymphatic: Abdominal aorta is normal caliber with atherosclerotic calcification. There is no retroperitoneal or periportal lymphadenopathy. No pelvic lymphadenopathy. Reproductive: Post hysterectomy.  Adnexa unremarkable Other: No perineum abscess identified. No free fluid or abscess in the pelvis Musculoskeletal: No aggressive osseous lesion. Review of the MIP images confirms the above findings. IMPRESSION: Chest Impression: 1. No evidence acute pulmonary embolism. 2. No acute pulmonary parenchymal findings. 3. Abdomen / Pelvis Impression: 1. Bladder wall thickening and hazy serosal surface suggests cystitis. 2. No evidence of abscess in the pelvis. 3. No evidence of metastatic disease in the abdomen pelvis. 4.  Aortic Atherosclerosis (ICD10-I70.0). Electronically Signed   By: Suzy Bouchard M.D.   On:  07/16/2021 18:39    ____________________________________________   PROCEDURES .Critical Care  Date/Time: 07/16/2021 7:21 PM Performed by: Carrie Mew, MD Authorized by: Carrie Mew, MD   Critical care provider statement:    Critical care time (minutes):  33   Critical care time was exclusive of:  Separately billable procedures and treating other patients   Critical care was necessary to treat or prevent imminent or life-threatening deterioration of the following conditions:  Circulatory failure   Critical care was time spent personally by me on the following activities:  Development of treatment plan with patient or surrogate, discussions with consultants, evaluation of patient's response to treatment, examination of patient, obtaining history from patient or surrogate, ordering and performing treatments and interventions, ordering and review of laboratory studies, ordering and review of radiographic studies, pulse oximetry, re-evaluation of patient's condition and review of old charts  ____________________________________________  DIFFERENTIAL DIAGNOSIS   Anemia, pulmonary embolism, dehydration, fatigue, recurrent cancer  CLINICAL IMPRESSION / ASSESSMENT AND PLAN / ED COURSE  Medications ordered in the ED: Medications  0.9 %  sodium chloride infusion (has no administration in time range)  iohexol (OMNIPAQUE) 350 MG/ML injection 80 mL (80 mLs Intravenous Contrast Given 07/16/21 1749)    Pertinent labs & imaging results that were available during my care of the patient were reviewed by me and considered in my medical decision making (see chart for details).  Melissa [name] was evaluated in Emergency Department on 07/16/2021 for the symptoms described in the history of present illness. She was evaluated in the context of the global COVID-19 pandemic, which necessitated consideration that the patient might be at risk for infection with the SARS-CoV-2 virus that  causes COVID-19. Institutional protocols and algorithms that pertain to the evaluation of patients at risk for COVID-19 are in a state of rapid change based on information released by regulatory bodies including the CDC and federal and state organizations. These policies and algorithms were followed during the patient's care in the ED.   Patient presents with worsening shortness of breath for the past week.  Vital signs are unremarkable.  No focal pain syndrome  and exam is nonfocal.  CBC does show acute anemia and thrombocytopenia, and pathologist review raises concern for acute myeloid neoplasm.  Will obtain CTA chest to rule out PE, CT abdomen pelvis to evaluate for possible recurrent anal cancer or metastatic disease, and discuss with oncology.  ----------------------------------------- 7:21 PM on 07/16/2021 ----------------------------------------- Case discussed with oncology Dr. Janese Banks who is adding additional labs for further initial work-up.  Agrees with transfusion of 1 unit RBC, irradiated.  No platelets for now.  Can follow-up outpatient if patient is comfortable with discharge.  Clinical Course as of 07/16/21 1921  Wed Jul 16, 2021  1917 Discussed results with patient including possibility of AML and need to follow-up with heme-onc.  She agrees with RBC transfusion for now for symptomatic anemia, and is otherwise stable for discharge afterward. [PS]    Clinical Course User Index [PS] Carrie Mew, MD     ____________________________________________   FINAL CLINICAL IMPRESSION(S) / ED DIAGNOSES    Final diagnoses:  Symptomatic anemia  Thrombocytopenia [facility])     ED Discharge Orders     None       Portions of this note were generated with dragon dictation software. Dictation errors may occur despite best attempts at proofreading.    Carrie Mew, MD 07/16/21 1921

## 2021-07-17 ENCOUNTER — Encounter: Payer: PPO | Admitting: Physician Assistant

## 2021-07-17 ENCOUNTER — Other Ambulatory Visit: Payer: Self-pay | Admitting: *Deleted

## 2021-07-17 ENCOUNTER — Telehealth: Payer: Self-pay | Admitting: Oncology

## 2021-07-17 DIAGNOSIS — D696 Thrombocytopenia, unspecified: Secondary | ICD-10-CM

## 2021-07-17 DIAGNOSIS — D649 Anemia, unspecified: Secondary | ICD-10-CM

## 2021-07-17 LAB — TYPE AND SCREEN
ABO/RH(D): O POS
Antibody Screen: NEGATIVE
Unit division: 0

## 2021-07-17 LAB — BPAM RBC
Blood Product Expiration Date: 202208302359
ISSUE DATE / TIME: 202208102123
Unit Type and Rh: 9500

## 2021-07-17 LAB — ABO/RH: ABO/RH(D): O POS

## 2021-07-17 NOTE — Telephone Encounter (Signed)
I was planning to see the patient in the ER this morning and get bone marrow done for her inpatient. But patient was discharged yesterday from the ER. I called the patient and explained we could be dealing with possible acute leukemia. I plan to see her in my clinic on 07/18/21, repeat cbc and arrange for bone marrow biopsy asap. Advised fall precautions. Patient would like to get her care at [facility] if possible.   Dr. Randa Evens, MD, MPH [facility] at [facility] [phone] 07/17/2021 8:57 AM

## 2021-07-17 NOTE — Progress Notes (Signed)
i

## 2021-07-18 ENCOUNTER — Other Ambulatory Visit: Payer: Self-pay | Admitting: *Deleted

## 2021-07-18 ENCOUNTER — Other Ambulatory Visit: Payer: Self-pay | Admitting: Radiology

## 2021-07-18 ENCOUNTER — Encounter: Payer: Self-pay | Admitting: Oncology

## 2021-07-18 ENCOUNTER — Inpatient Hospital Stay: Payer: PPO

## 2021-07-18 ENCOUNTER — Inpatient Hospital Stay: Payer: PPO | Attending: Oncology | Admitting: Oncology

## 2021-07-18 ENCOUNTER — Encounter: Payer: PPO | Admitting: Physician Assistant

## 2021-07-18 ENCOUNTER — Telehealth: Payer: Self-pay | Admitting: *Deleted

## 2021-07-18 VITALS — BP 126/61 | Temp 99.4°F | Ht 61.0 in | Wt 174.8 lb

## 2021-07-18 DIAGNOSIS — Z85048 Personal history of other malignant neoplasm of rectum, rectosigmoid junction, and anus: Secondary | ICD-10-CM | POA: Insufficient documentation

## 2021-07-18 DIAGNOSIS — D649 Anemia, unspecified: Secondary | ICD-10-CM

## 2021-07-18 DIAGNOSIS — D696 Thrombocytopenia, unspecified: Secondary | ICD-10-CM | POA: Diagnosis not present

## 2021-07-18 DIAGNOSIS — Z5111 Encounter for antineoplastic chemotherapy: Secondary | ICD-10-CM | POA: Insufficient documentation

## 2021-07-18 DIAGNOSIS — Z833 Family history of diabetes mellitus: Secondary | ICD-10-CM | POA: Diagnosis not present

## 2021-07-18 DIAGNOSIS — Z9221 Personal history of antineoplastic chemotherapy: Secondary | ICD-10-CM | POA: Insufficient documentation

## 2021-07-18 DIAGNOSIS — Z923 Personal history of irradiation: Secondary | ICD-10-CM | POA: Diagnosis not present

## 2021-07-18 DIAGNOSIS — E039 Hypothyroidism, unspecified: Secondary | ICD-10-CM | POA: Diagnosis not present

## 2021-07-18 DIAGNOSIS — Z79899 Other long term (current) drug therapy: Secondary | ICD-10-CM | POA: Insufficient documentation

## 2021-07-18 DIAGNOSIS — Z8249 Family history of ischemic heart disease and other diseases of the circulatory system: Secondary | ICD-10-CM | POA: Insufficient documentation

## 2021-07-18 DIAGNOSIS — R0602 Shortness of breath: Secondary | ICD-10-CM

## 2021-07-18 DIAGNOSIS — Z803 Family history of malignant neoplasm of breast: Secondary | ICD-10-CM | POA: Diagnosis not present

## 2021-07-18 DIAGNOSIS — C9200 Acute myeloblastic leukemia, not having achieved remission: Secondary | ICD-10-CM | POA: Diagnosis not present

## 2021-07-18 LAB — CBC WITH DIFFERENTIAL/PLATELET
Abs Immature Granulocytes: 3.9 10*3/uL — ABNORMAL HIGH (ref 0.00–0.07)
Band Neutrophils: 8 %
Basophils Absolute: 0 10*3/uL (ref 0.0–0.1)
Basophils Relative: 0 %
Blasts: 10 %
Eosinophils Absolute: 0 10*3/uL (ref 0.0–0.5)
Eosinophils Relative: 0 %
HCT: 26.7 % — ABNORMAL LOW (ref 36.0–46.0)
Hemoglobin: 8.6 g/dL — ABNORMAL LOW (ref 12.0–15.0)
Lymphocytes Relative: 30 %
Lymphs Abs: 4.7 10*3/uL — ABNORMAL HIGH (ref 0.7–4.0)
MCH: 29.8 pg (ref 26.0–34.0)
MCHC: 32.2 g/dL (ref 30.0–36.0)
MCV: 92.4 fL (ref 80.0–100.0)
Metamyelocytes Relative: 1 %
Monocytes Absolute: 1.1 10*3/uL — ABNORMAL HIGH (ref 0.1–1.0)
Monocytes Relative: 7 %
Myelocytes: 22 %
Neutro Abs: 4.3 10*3/uL (ref 1.7–7.7)
Neutrophils Relative %: 20 %
Platelets: 15 10*3/uL — CL (ref 150–400)
Promyelocytes Relative: 2 %
RBC: 2.89 MIL/uL — ABNORMAL LOW (ref 3.87–5.11)
RDW: 19.5 % — ABNORMAL HIGH (ref 11.5–15.5)
Smear Review: DECREASED
WBC Morphology: ABNORMAL
WBC: 15.5 10*3/uL — ABNORMAL HIGH (ref 4.0–10.5)
nRBC: 0.5 % — ABNORMAL HIGH (ref 0.0–0.2)

## 2021-07-18 LAB — COMP PANEL: LEUKEMIA/LYMPHOMA: Immunophenotypic Profile: 42

## 2021-07-18 MED ORDER — MORPHINE SULFATE (CONCENTRATE) 10 MG /0.5 ML PO SOLN: 5.0000 mg | ORAL | 0 refills | Status: DC | PRN

## 2021-07-18 MED ORDER — ALLOPURINOL 300 MG PO TABS: 300.0000 mg | ORAL_TABLET | Freq: Every day | ORAL | 3 refills | Status: AC

## 2021-07-18 MED ORDER — SODIUM CHLORIDE 0.9% IV SOLUTION
250.0000 mL | Freq: Once | INTRAVENOUS | Status: AC
  Administered 2021-07-18: 250 mL via INTRAVENOUS
  Filled 2021-07-18: qty 250

## 2021-07-18 MED ORDER — ONDANSETRON HCL 8 MG PO TABS
8.0000 mg | ORAL_TABLET | Freq: Two times a day (BID) | ORAL | 1 refills | Status: AC | PRN
Start: 2021-07-18 — End: ?

## 2021-07-18 MED ORDER — PROCHLORPERAZINE MALEATE 10 MG PO TABS
10.0000 mg | ORAL_TABLET | Freq: Four times a day (QID) | ORAL | 1 refills | Status: AC | PRN
Start: 2021-07-18 — End: ?

## 2021-07-18 MED ORDER — LIDOCAINE-PRILOCAINE 2.5-2.5 % EX CREA: TOPICAL_CREAM | CUTANEOUS | 3 refills | Status: AC

## 2021-07-18 MED ORDER — LORAZEPAM 0.5 MG PO TABS: 0.5000 mg | ORAL_TABLET | Freq: Four times a day (QID) | ORAL | 0 refills | Status: AC | PRN

## 2021-07-18 NOTE — Telephone Encounter (Signed)
morphine

## 2021-07-18 NOTE — Telephone Encounter (Addendum)
Pt in exam room and checked her oxygen and it was 93 %  on room air at rest. Due to her short of breath I walked the pt. And in less than 2 minutes her sats was 88% on room air while ambulating. I sat her down and let her rest. I ambulated her with oxygen 2 liters and her sat was 97%.  Pt. Needs oxgen  2 liter continuous gas per nasal canula

## 2021-07-18 NOTE — Patient Instructions (Signed)
[facility]  Discharge Instructions: Thank you for choosing [facility] to provide your oncology and hematology care.  If you have a lab appointment with the [facility], please go directly to the [facility] and check in at the registration area.  Wear comfortable clothing and clothing appropriate for easy access to any Portacath or PICC line.   We strive to give you quality time with your provider. You may need to reschedule your appointment if you arrive late (15 or more minutes).  Arriving late affects you and other patients whose appointments are after yours.  Also, if you miss three or more appointments without notifying the office, you may be dismissed from the clinic at the provider's discretion.      For prescription refill requests, have your pharmacy contact our office and allow 72 hours for refills to be completed.    Today you received the following : Platelet transfusion   Platelet Transfusion A platelet transfusion is a procedure in which you receive donated platelets through an IV. Platelets are tiny pieces of blood cells. When you get an injury, platelets clump together in the area to form a blood clot. This helps stop bleeding and is the beginning of the healing process. If you have too few platelets, your blood may have trouble clotting. This may cause you to bleedand bruise very easily. You may need a platelet transfusion if you have a condition that causes a low number of platelets (thrombocytopenia). A platelet transfusion may be used to stop or prevent excessive bleeding. Tell a health care provider about: Any reactions you have had during previous transfusions. Any allergies you have. All medicines you are taking, including vitamins, herbs, eye drops, creams, and over-the-counter medicines. Any blood disorders you have. Any surgeries you have had. Any medical conditions you have. Whether you are pregnant or may be  pregnant. What are the risks? Generally, this is a safe procedure. However, problems may occur, including: Fever. Infection. Allergic reaction to the donor platelets. Your body's disease-fighting system (immune system) attacking the donor platelets (hemolytic reaction). This is rare. A rare reaction that causes lung damage (transfusion-related acute lung injury). What happens before the procedure? Medicines Ask your health care provider about: Changing or stopping your regular medicines. This is especially important if you are taking diabetes medicines or blood thinners. Taking medicines such as aspirin and ibuprofen. These medicines can thin your blood. Do not take these medicines unless your health care provider tells you to take them. Taking over-the-counter medicines, vitamins, herbs, and supplements. General instructions You will have a blood test to determine your blood type. Your blood type determines what kind of platelets you will be given. Follow instructions from your health care provider about eating or drinking restrictions. If you have had an allergic reaction to a transfusion in the past, you may be given medicine to help prevent a reaction. Your temperature, blood pressure, pulse, and breathing will be monitored. What happens during the procedure?  An IV will be inserted into one of your veins. For your safety, two health care providers will verify your identity along with the donor platelets about to be infused. A bag of donor platelets will be connected to your IV. The platelets will flow into your bloodstream. This usually takes 30-60 minutes. Your temperature, blood pressure, pulse, and breathing will be monitored during the transfusion. This helps detect early signs of any reaction. You will also be monitored for other symptoms that  may indicate a reaction, including chills, hives, or itching. If you have signs of a reaction at any time, your transfusion will be  stopped, and you may be given medicine to help manage the reaction. When your transfusion is complete, your IV will be removed. Pressure may be applied to the IV site for a few minutes to stop any bleeding. The IV site will be covered with a bandage (dressing). The procedure may vary among health care providers and hospitals. What happens after the procedure? Your blood pressure, temperature, pulse, and breathing will be monitored until you leave the hospital or clinic. You may have some bruising and soreness at your IV site. Follow these instructions at home: Medicines Take over-the-counter and prescription medicines only as told by your health care provider. Talk with your health care provider before you take any medicines that contain aspirin or NSAIDs. These medicines increase your risk for dangerous bleeding. General instructions Change or remove your dressing as told by your health care provider. Return to your normal activities as told by your health care provider. Ask your health care provider what activities are safe for you. Do not take baths, swim, or use a hot tub until your health care provider approves. Ask your health care provider if you may take showers. Check your IV site every day for signs of infection. Check for: Redness, swelling, or pain. Fluid or blood. If fluid or blood drains from your IV site, use your hands to press down firmly on a bandage covering the area for a minute or two. Doing this should stop the bleeding. Warmth. Pus or a bad smell. Keep all follow-up visits as told by your health care provider. This is important. Contact a health care provider if you have: A headache that does not go away with medicine. Hives, rash, or itchy skin. Nausea or vomiting. Unusual tiredness or weakness. Signs of infection at your IV site. Get help right away if: You have a fever or chills. You urinate less often than usual. Your urine is darker colored than normal. You  have any of the following: Trouble breathing. Pain in your back, abdomen, or chest. Cool, clammy skin. A fast heartbeat. Summary Platelets are tiny pieces of blood cells that clump together to form a blood clot when you have an injury. If you have too few platelets, your blood may have trouble clotting. A platelet transfusion is a procedure in which you receive donated platelets through an IV. A platelet transfusion may be used to stop or prevent excessive bleeding. After the procedure, check your IV site every day for signs of infection, including redness, swelling, pain, or warmth. This information is not intended to replace advice given to you by your health care provider. Make sure you discuss any questions you have with your healthcare provider. Document Revised: 12/29/2017 Document Reviewed: 12/29/2017 Elsevier Patient Education  2022 [place].     To help prevent nausea and vomiting after your treatment, we encourage you to take your nausea medication as directed.  BELOW ARE SYMPTOMS THAT SHOULD BE REPORTED IMMEDIATELY: *FEVER GREATER THAN 100.4 F (38 C) OR HIGHER *CHILLS OR SWEATING *NAUSEA AND VOMITING THAT IS NOT CONTROLLED WITH YOUR NAUSEA MEDICATION *UNUSUAL SHORTNESS OF BREATH *UNUSUAL BRUISING OR BLEEDING *URINARY PROBLEMS (pain or burning when urinating, or frequent urination) *BOWEL PROBLEMS (unusual diarrhea, constipation, pain near the anus) TENDERNESS IN MOUTH AND THROAT WITH OR WITHOUT PRESENCE OF ULCERS (sore throat, sores in mouth, or a toothache) UNUSUAL RASH, SWELLING OR  PAIN  UNUSUAL VAGINAL DISCHARGE OR ITCHING   Items with * indicate a potential emergency and should be followed up as soon as possible or go to the Emergency Department if any problems should occur.  Please show the CHEMOTHERAPY ALERT CARD or IMMUNOTHERAPY ALERT CARD at check-in to the Emergency Department and triage nurse.  Should you have questions after your visit or need to cancel  or reschedule your appointment, please contact [facility]  [phone] and follow the prompts.  Office hours are 8:00 a.m. to 4:30 p.m. Monday - Friday. Please note that voicemails left after 4:00 p.m. may not be returned until the following business day.  We are closed weekends and major holidays. You have access to a nurse at all times for urgent questions. Please call the main number to the clinic [phone] and follow the prompts.  For any non-urgent questions, you may also contact your provider using MyChart. We now offer e-Visits for anyone 28 and older to request care online for non-urgent symptoms. For details visit [email or web address].   Also download the MyChart app! Go to the app store, search "MyChart", open the app, select [facility], and log in with your MyChart username and password.  Due to Covid, a mask is required upon entering the hospital/clinic. If you do not have a mask, one will be given to you upon arrival. For doctor visits, patients may have 1 support person aged 53 or older with them. For treatment visits, patients cannot have anyone with them due to current Covid guidelines and our immunocompromised population.

## 2021-07-19 ENCOUNTER — Encounter: Payer: Self-pay | Admitting: Oncology

## 2021-07-19 LAB — BPAM PLATELET PHERESIS
Blood Product Expiration Date: 202208142359
ISSUE DATE / TIME: 202208121340
Unit Type and Rh: 5100

## 2021-07-19 LAB — PREPARE PLATELET PHERESIS: Unit division: 0

## 2021-07-19 NOTE — Progress Notes (Addendum)
Hematology/Oncology Consult note [facility] Telephone:([phone] [phone]  Patient Care Team: Jerrol Banana., MD as PCP - General (Unknown Physician Specialty) Thelma Comp, [place] as Consulting Physician (Optometry) Vevelyn Royals, MD as Consulting Physician (Ophthalmology)   Name of the patient: Melissa [name]  [id number]  07-17-1946    Reason for referral-concern for acute leukemia   Referring physician-Dr. Joni Fears  Date of visit: 07/19/21   History of presenting illness-patient is a 75 year old female with a past medical history significant for anal cancer that was treated back in 2004With concurrent chemoradiation.  This was complicated by radiation necrosis and patient was receiving hyperbaric oxygen therapy with Dr. Fransisca Connors from GYN oncology at [facility].  Patient came to the ER with symptoms of worsening fatigue and exertional shortness of breath.  In the ER patient was found to have white count of 13, H&H of 7.7/24.5 with an MCV of 96 and a platelet count of 17.  A year ago patient had a normal WBC count of 5, H&H of 12.1/36.8 and a platelet count of 198.  Pathology smear review showed leukocytosis with immature cells/blasts, pseudo pelgeroid changes and hypogranular neutrophils.  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  I was called about these findings and I ordered additional blood work including flow cytometry for the patient.  Patient received 1 unit of blood transfusion and was discharged from the ER.  She sees me for further management today.  Patient lives with her husband and has been feeling poorly for the last 1 month or so.  Ever since her anal cancer back in 2004 she has had some on and off rectal bleeding.  More recently over the last few days patient also reports some vaginal bleeding.  She reports significant fatigue and exertional shortness of breath.  ECOG PS- 2-3  Pain scale- 0   Review of  systems- Review of Systems  Constitutional:  Positive for malaise/fatigue. Negative for chills, fever and weight loss.  HENT:  Negative for congestion, ear discharge and nosebleeds.   Eyes:  Negative for blurred vision.  Respiratory:  Positive for shortness of breath. Negative for cough, hemoptysis, sputum production and wheezing.   Cardiovascular:  Negative for chest pain, palpitations, orthopnea and claudication.  Gastrointestinal:  Positive for blood in stool. Negative for abdominal pain, constipation, diarrhea, heartburn, melena, nausea and vomiting.  Genitourinary:  Negative for dysuria, flank pain, frequency, hematuria and urgency.       Vaginal bleeding  Musculoskeletal:  Negative for back pain, joint pain and myalgias.  Skin:  Negative for rash.  Neurological:  Negative for dizziness, tingling, focal weakness, seizures, weakness and headaches.  Endo/Heme/Allergies:  Does not bruise/bleed easily.  Psychiatric/Behavioral:  Negative for depression and suicidal ideas. The patient does not have insomnia.    Allergies  Allergen Reactions   Pentazocine Lactate    Propoxyphene N-Acetaminophen    Talwin  [Pentazocine]    Tramadol Hcl    Ciprofloxacin Hcl Rash   Darvon  [Propoxyphene] Rash    Hallucinations Hallucinations.   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   Fibromuscular dysplasia of both carotid arteries ([place]) 07/01/2021   Dermatochalasis of eyelid 10/22/2020   Ptosis, both eyelids 10/22/2020   Visual field defect 10/22/2020   Low back pain 07/22/2020   Abdominal pain, chronic, right lower quadrant 01/03/2019   Diarrhea 82/50/0370   Lichen sclerosus  12/12/2015   Vulvitis 11/22/2015   Atrophic vaginitis 04/11/2015   Chronic diarrhea 04/11/2015   CD (contact dermatitis) 04/11/2015   Affective disorder, major 04/11/2015   Bloodgood disease 04/11/2015   Fibrositis  04/11/2015   Acid reflux 04/11/2015   Acquired hypothyroidism 04/11/2015   Hypersomnia 04/11/2015   Internal hemorrhoids 04/11/2015   Muscle ache 04/11/2015   Neuralgia neuritis, sciatic nerve 04/11/2015   Herpes zoster 04/11/2015   Apnea, sleep 04/11/2015   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  Chemotherapy and radiation.   Anemia    Basal cell carcinoma 09/30/2020   upper back, [facility] 11/11/2020   Depression    Diffuse cystic mastopathy 12/30/2012   Dry mouth    Fibroid    Fibromyalgia 2006   Hemorrhoids 2006   Hypothyroidism    Migraines 2006   Other nonspecific finding on examination of urine 02/20/2013   Personal history of chemotherapy    skin, rectal   Personal history of radiation therapy    rectal   Sciatica    Tubular adenoma of colon 07/08/2016   Vulvitis 11/22/2015     Past Surgical History:  Procedure Laterality Date   ANAL MASS EXC  01/12/2003   Operative report described the nodular area of invasive squamous cell carcinoma anywhere from the 3-6 o'clock position multiple procedure notes   anal ulcer  04/2016   SQUAMOUS MUCOSA WITH ACUTE ACTIVE ULCER AND REACTIVE HYPERPLASIA   BREAST BIOPSY Bilateral    neg   BREAST SURGERY  2005   BREAST REDUCTION SX Breast Biopsy (left) Neg for cancer   CATARACT EXTRACTION     COLONOSCOPY  2008, 2013   COLONOSCOPY WITH PROPOFOL N/A 07/08/2016   tubular adenoma/ COLONOSCOPY WITH  PROPOFOL;  Surgeon: Christene Lye, MD;  Location: [facility] ENDOSCOPY;  Service: Endoscopy;  Laterality: N/A;   COSMETIC SURGERY  20008   FACIAL   REDUCTION MAMMAPLASTY Bilateral 1998   TONSILLECTOMY  2005   VAGINAL HYSTERECTOMY  1986   Secondary to [place] History   Socioeconomic History   Marital status: Married    Spouse name: Not on file   Number of children: 2   Years of education: Not on file   Highest education level: Associate degree: occupational, Hotel manager, or vocational program  Occupational History   Occupation: retired  Tobacco Use   Smoking status: Never   Smokeless tobacco: Never  Vaping Use   Vaping Use: Never used  Substance and Sexual Activity   Alcohol use: No   Drug use: No   Sexual activity: Not Currently    Birth control/protection: Surgical  Other Topics Concern   Not on file  Social History Narrative   Not on file   Social Determinants of Health   Financial Resource Strain: Not on file  Food Insecurity: Not on file  Transportation Needs: Not on file  Physical Activity: Not on file  Stress: Not  on file  Social Connections: Not on file  Intimate Partner Violence: Not on file     Family History  Problem Relation Age of Onset   Diabetes Father    Cancer Father    Stroke Sister    Colitis Sister    Breast cancer Paternal Aunt    Heart disease Maternal Grandmother    Ovarian cancer Neg Hx      Current Outpatient Medications:    acetaminophen (TYLENOL) 650 MG CR tablet, Take 650 mg by mouth daily as needed for pain., Disp: , Rfl:    Bioflavonoid Products (BIOFLEX PO), Take by mouth daily., Disp: , Rfl:    buPROPion (WELLBUTRIN XL) 150 MG 24 hr tablet, TAKE 1 TABLET BY MOUTH EVERY DAY, Disp: 90 tablet, Rfl: 1   cholecalciferol (VITAMIN D) 1000 units tablet, Take 5,000 Units by mouth daily. , Disp: , Rfl:    cholestyramine (QUESTRAN) 4 g packet, DISSOLVE 1 PACKET (4 G TOTAL) IN LIQUID AND TAKE BY MOUTH 2 (TWO) TIMES DAILY.,  Disp: 180 packet, Rfl: 1   clobetasol ointment (TEMOVATE) 0.96 %, Apply 1 application topically 2 (two) times daily. Twice a day for 14 days then daily for 4 weeks, Disp: 30 g, Rfl: 3   diphenhydrAMINE (BENADRYL) 25 MG tablet, Take 25 mg by mouth every 6 (six) hours as needed for itching. , Disp: , Rfl:    ibuprofen (ADVIL) 600 MG tablet, Take 600 mg by mouth every 6 (six) hours as needed., Disp: , Rfl:    levothyroxine (SYNTHROID) 175 MCG tablet, TAKE 1 TABLET BY MOUTH DAILY BEFORE BREAKFAST, Disp: 90 tablet, Rfl: 3   loperamide (IMODIUM) 2 MG capsule, Take 2 mg by mouth 4 (four) times daily as needed for diarrhea or loose stools., Disp: , Rfl:    Multiple Vitamins-Minerals (MULTIVITAL) tablet, Take 1 tablet by mouth daily., Disp: , Rfl:    nabumetone (RELAFEN) 500 MG tablet, TAKE 1 TABLET BY MOUTH TWICE A DAY AS NEEDED, Disp: 180 tablet, Rfl: 0   Omega-3 Fatty Acids (FISH OIL) 1200 MG CAPS, Take by mouth daily. , Disp: , Rfl:    Probiotic Product (DIGESTIVE ADVANTAGE PO), Take 1 capsule by mouth 2 (two) times daily., Disp: , Rfl:    sertraline (ZOLOFT) 100 MG tablet, TAKE 1 TABLET BY MOUTH TWICE A DAY, Disp: 180 tablet, Rfl: 1   allopurinol (ZYLOPRIM) 300 MG tablet, Take 1 tablet (300 mg total) by mouth daily., Disp: 30 tablet, Rfl: 3   lidocaine-prilocaine (EMLA) cream, Apply to affected area once, Disp: 30 g, Rfl: 3   LORazepam (ATIVAN) 0.5 MG tablet, Take 1 tablet (0.5 mg total) by mouth every 6 (six) hours as needed (Nausea or vomiting)., Disp: 30 tablet, Rfl: 0   Morphine Sulfate (MORPHINE CONCENTRATE) 10 mg / 0.5 ml concentrated solution, Take 0.25 mLs (5 mg total) by mouth every 4 (four) hours as needed for severe pain or shortness of breath., Disp: 30 mL, Rfl: 0   ondansetron (ZOFRAN) 8 MG tablet, Take 1 tablet (8 mg total) by mouth 2 (two) times daily as needed (Nausea or vomiting)., Disp: 30 tablet, Rfl: 1   OXYCODONE-ACETAMINOPHEN PO, Take 1 tablet by mouth every 4 (four) hours as  needed. 5 mg (Patient not taking: No sig reported), Disp: , Rfl:    prochlorperazine (COMPAZINE) 10 MG tablet, Take 1 tablet (10 mg total) by mouth every 6 (six) hours as needed (Nausea or vomiting)., Disp: 30 tablet, Rfl: 1   Physical exam:  Vitals:  07/18/21 1135  BP: 126/61  Temp: 99.4 F (37.4 C)  TempSrc: Oral  Weight: 174 lb 12.8 oz (79.3 kg)  Height: _0  (1.549 m)   Physical Exam Constitutional:      General: She is not in acute distress.    Comments: Sitting in a wheelchair.  Appears fatigued  Cardiovascular:     Rate and Rhythm: Normal rate and regular rhythm.     Heart sounds: Normal heart sounds.  Pulmonary:     Effort: Pulmonary effort is normal.     Breath sounds: Normal breath sounds.  Abdominal:     General: Bowel sounds are normal.     Palpations: Abdomen is soft.     Comments: No palpable hepatosplenomegaly  Lymphadenopathy:     Comments: No palpable cervical axillary adenopathy  Skin:    General: Skin is warm and dry.  Neurological:     Mental Status: She is alert and oriented to person, place, and time.       CMP Latest Ref Rng & Units 07/16/2021  Glucose 70 - 99 mg/dL 135(H)  BUN 8 - 23 mg/dL 19  Creatinine 0.44 - 1.00 mg/dL 1.10(H)  Sodium 135 - 145 mmol/L 138  Potassium 3.5 - 5.1 mmol/L 3.9  Chloride 98 - 111 mmol/L 106  CO2 22 - 32 mmol/L 23  Calcium 8.9 - 10.3 mg/dL 8.9  Total Protein 6.5 - 8.1 g/dL 7.4  Total Bilirubin 0.3 - 1.2 mg/dL 0.6  Alkaline Phos 38 - 126 U/L 83  AST 15 - 41 U/L 26  ALT 0 - 44 U/L 16   CBC Latest Ref Rng & Units 07/18/2021  WBC 4.0 - 10.5 K/uL 15.5(H)  Hemoglobin 12.0 - 15.0 g/dL 8.6(L)  Hematocrit 36.0 - 46.0 % 26.7(L)  Platelets 150 - 400 K/uL 15(LL)    No images are attached to the encounter.  DG Chest 2 View  Result Date: 07/16/2021 CLINICAL DATA:  Dyspnea with exertion. EXAM: CHEST - 2 VIEW COMPARISON:  May 09, 2015. FINDINGS: The heart size and mediastinal contours are within normal limits. Both  lungs are clear. The visualized skeletal structures are unremarkable. IMPRESSION: No active cardiopulmonary disease. Electronically Signed   By: Marijo Conception M.D.   On: 07/16/2021 15:08   CT Angio Chest PE W and/or Wo Contrast  Result Date: 07/16/2021 CLINICAL DATA:  Concern for pulmonary embolism. Receiving hyperbaric wound treatment related to remote pelvic radiation for anal rectal cancer. EXAM: CT ANGIOGRAPHY CHEST CT ABDOMEN AND PELVIS WITH CONTRAST TECHNIQUE: Multidetector CT imaging of the chest was performed using the standard protocol during bolus administration of intravenous contrast. Multiplanar CT image reconstructions and MIPs were obtained to evaluate the vascular anatomy. Multidetector CT imaging of the abdomen and pelvis was performed using the standard protocol during bolus administration of intravenous contrast. CONTRAST:  51m OMNIPAQUE IOHEXOL 350 MG/ML SOLN COMPARISON:  CT 12/25/2014 FINDINGS: CTA CHEST FINDINGS Cardiovascular: No filling defects within the pulmonary arteries to suggest acute pulmonary embolism. No significant vascular findings. Normal heart size. No pericardial effusion. Mediastinum/Nodes: No axillary or supraclavicular adenopathy. No mediastinal or hilar adenopathy. No pericardial fluid. Esophagus normal. Lungs/Pleura: No suspicious pulmonary nodules.  Airways normal Musculoskeletal: No acute osseous abnormality. Review of the MIP images confirms the above findings. CT ABDOMEN and PELVIS FINDINGS Hepatobiliary: Several benign hepatic cysts in central RIGHT liver again demonstrated. Enhancing hepatic lesion. Gallbladder is collapsed. Pancreas: Pancreas is normal. No ductal dilatation. No pancreatic inflammation. Spleen: Normal spleen Adrenals/urinary tract: Adrenal glands  and kidneys are normal. The bladder has uniform thickened wall to 8 mm with enhancing mucosal surface. The serosal surface is hazy. No bladder distension. Stomach/Bowel: Stomach, small bowel, appendix,  and cecum are normal. The colon and rectosigmoid colon are normal. Vascular/Lymphatic: Abdominal aorta is normal caliber with atherosclerotic calcification. There is no retroperitoneal or periportal lymphadenopathy. No pelvic lymphadenopathy. Reproductive: Post hysterectomy.  Adnexa unremarkable Other: No perineum abscess identified. No free fluid or abscess in the pelvis Musculoskeletal: No aggressive osseous lesion. Review of the MIP images confirms the above findings. IMPRESSION: Chest Impression: 1. No evidence acute pulmonary embolism. 2. No acute pulmonary parenchymal findings. 3. Abdomen / Pelvis Impression: 1. Bladder wall thickening and hazy serosal surface suggests cystitis. 2. No evidence of abscess in the pelvis. 3. No evidence of metastatic disease in the abdomen pelvis. 4.  Aortic Atherosclerosis (ICD10-I70.0). Electronically Signed   By: Suzy Bouchard M.D.   On: 07/16/2021 18:39   CT ABDOMEN PELVIS W CONTRAST  Result Date: 07/16/2021 CLINICAL DATA:  Concern for pulmonary embolism. Receiving hyperbaric wound treatment related to remote pelvic radiation for anal rectal cancer. EXAM: CT ANGIOGRAPHY CHEST CT ABDOMEN AND PELVIS WITH CONTRAST TECHNIQUE: Multidetector CT imaging of the chest was performed using the standard protocol during bolus administration of intravenous contrast. Multiplanar CT image reconstructions and MIPs were obtained to evaluate the vascular anatomy. Multidetector CT imaging of the abdomen and pelvis was performed using the standard protocol during bolus administration of intravenous contrast. CONTRAST:  55m OMNIPAQUE IOHEXOL 350 MG/ML SOLN COMPARISON:  CT 12/25/2014 FINDINGS: CTA CHEST FINDINGS Cardiovascular: No filling defects within the pulmonary arteries to suggest acute pulmonary embolism. No significant vascular findings. Normal heart size. No pericardial effusion. Mediastinum/Nodes: No axillary or supraclavicular adenopathy. No mediastinal or hilar adenopathy. No  pericardial fluid. Esophagus normal. Lungs/Pleura: No suspicious pulmonary nodules.  Airways normal Musculoskeletal: No acute osseous abnormality. Review of the MIP images confirms the above findings. CT ABDOMEN and PELVIS FINDINGS Hepatobiliary: Several benign hepatic cysts in central RIGHT liver again demonstrated. Enhancing hepatic lesion. Gallbladder is collapsed. Pancreas: Pancreas is normal. No ductal dilatation. No pancreatic inflammation. Spleen: Normal spleen Adrenals/urinary tract: Adrenal glands and kidneys are normal. The bladder has uniform thickened wall to 8 mm with enhancing mucosal surface. The serosal surface is hazy. No bladder distension. Stomach/Bowel: Stomach, small bowel, appendix, and cecum are normal. The colon and rectosigmoid colon are normal. Vascular/Lymphatic: Abdominal aorta is normal caliber with atherosclerotic calcification. There is no retroperitoneal or periportal lymphadenopathy. No pelvic lymphadenopathy. Reproductive: Post hysterectomy.  Adnexa unremarkable Other: No perineum abscess identified. No free fluid or abscess in the pelvis Musculoskeletal: No aggressive osseous lesion. Review of the MIP images confirms the above findings. IMPRESSION: Chest Impression: 1. No evidence acute pulmonary embolism. 2. No acute pulmonary parenchymal findings. 3. Abdomen / Pelvis Impression: 1. Bladder wall thickening and hazy serosal surface suggests cystitis. 2. No evidence of abscess in the pelvis. 3. No evidence of metastatic disease in the abdomen pelvis. 4.  Aortic Atherosclerosis (ICD10-I70.0). Electronically Signed   By: SSuzy BouchardM.D.   On: 07/16/2021 18:39    Assessment and plan- Patient is a 75y.o. female referred from the ER for significant anemia and thrombocytopenia with peripheral blood smear findings concerning for myeloid neoplasm and possible leukemia  Flow cytometry was ordered in the ER by me and the results were not back at the time of visit with the patient.   It did come back later with 42% blasts consistent with  acute myeloid leukemia.  Immunophenotype not typical for APML. No evidence of DIC based on labs. I did communicate these findings with the patient and her husband later in the evening by a phone call.  At the time of my visit with the patient I shared my concerns about her significant anemia as well as thrombocytopenia and findings of pathology smear review which are highly concerning for an acute leukemia.  She received a unit of blood transfusion in the ER And given her ongoing rectal and some vaginal bleeding I will proceed with 1 unit of platelets today as well.  I did do an anemia work-up at the ER as well and iron studies do not indicate significant iron deficiency.  B12 and folate levels are normal.  Plan is for bone marrow biopsy on Monday.Discussed with the patient that if this is acute myeloid leukemia this overall portends poor prognosis.    Given her age, performance status and underlying comorbidities she is not a candidate for induction chemotherapy and a bone marrow transplant down the line.  Shot of induction chemotherapy We could consider treatment with hypoventilating agent such as Vidaza.  Vidaza will be given subcutaneously day 1 to day 7 every 28-day cycle.  Discussed risks and benefits of Vidaza including all but not limited to nausea, vomiting, low blood counts, risk of infections and hospitalizations.  Infection risk is particularly high in the setting of AML.  Venetoclax could also be considered with azacitidine however I would like to start off with azacitidine single agent alone and see how she tolerates it and consider adding venetoclax with cycle 2.  Her baseline LDH is elevated concerning for risk of tumor lysis syndrome with treatment.  Phase 3 trial in patients with newly diagnosed AML greater than 39 years of age azacitidine was better than best supportive care.  The rate of complete response was about 28%..  As  compared to best supportive care overall survival was six-point months versus 4 months..  Another phase 3 trial when compared to conventional care regimen azacitidine was associated with superior median overall survival of 25 months versus 16 months.  At the time of my visit patient said she would like to think about all her options before deciding to proceed with treatment.  After the results of flow cytometry came back patient and her family are willing to proceed with azacitidine single agent.  I have started her on allopurinol 300 mg daily for tumor lysis syndrome prophylaxis.  On the day for bone marrow biopsy we will also check uric acid, phosphorus and G6PD levels.  I will start her on Levaquin prophylaxis when she starts Vidaza early next week.  Treatment will be given with a palliative intent.  I also gave the option for the patient to get a second opinion at [facility] or [place] if she desires.  Patient would like to keep her care locally in [place] if possible.  Discussed that untreated AML overall has poor prognosis likely less than 3 months.  Suspect her exertional shortness of breath is secondary to her ongoing anemia.  We will continue with supportive transfusions but they are unlikely to have any meaningful benefit in the absence of treating the underlying condition.  I will prescribe low-dose morphine for her dyspnea as well as home oxygen.  Fall precautions reviewed with the patient. Discussed risk of hyperleucocytosis/ blast crisis in AML  Overall prognosis remains poor   Total face to face encounter time for this patient visit was 76  min.  Total non face to face encounter time for this patient on the day of the visit was 10 min. Thank you for this kind referral and the opportunity to participate in the care of this patient   Visit Diagnosis 1. Thrombocytopenia ([place])   2. 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 Randa Evens, MD, MPH [facility] [phone] 07/19/2021 5:16 PM

## 2021-07-21 ENCOUNTER — Ambulatory Visit
Admission: RE | Admit: 2021-07-21 | Discharge: 2021-07-21 | Disposition: A | Discharge status: Home / Self Care | Payer: PPO | Source: Ambulatory Visit | Attending: Oncology | Admitting: Oncology

## 2021-07-21 ENCOUNTER — Encounter: Payer: Self-pay | Admitting: Pharmacist

## 2021-07-21 ENCOUNTER — Other Ambulatory Visit: Payer: Self-pay | Admitting: Oncology

## 2021-07-21 ENCOUNTER — Other Ambulatory Visit: Payer: Self-pay | Admitting: *Deleted

## 2021-07-21 ENCOUNTER — Other Ambulatory Visit: Payer: Self-pay

## 2021-07-21 ENCOUNTER — Encounter: Payer: PPO | Admitting: Physician Assistant

## 2021-07-21 ENCOUNTER — Inpatient Hospital Stay (HOSPITAL_BASED_OUTPATIENT_CLINIC_OR_DEPARTMENT_OTHER): Payer: PPO | Admitting: Oncology

## 2021-07-21 ENCOUNTER — Inpatient Hospital Stay: Payer: PPO

## 2021-07-21 ENCOUNTER — Encounter: Payer: Self-pay | Admitting: Oncology

## 2021-07-21 DIAGNOSIS — C9200 Acute myeloblastic leukemia, not having achieved remission: Secondary | ICD-10-CM

## 2021-07-21 DIAGNOSIS — Z85048 Personal history of other malignant neoplasm of rectum, rectosigmoid junction, and anus: Secondary | ICD-10-CM | POA: Insufficient documentation

## 2021-07-21 DIAGNOSIS — D649 Anemia, unspecified: Secondary | ICD-10-CM

## 2021-07-21 DIAGNOSIS — Z79899 Other long term (current) drug therapy: Secondary | ICD-10-CM | POA: Diagnosis not present

## 2021-07-21 DIAGNOSIS — D696 Thrombocytopenia, unspecified: Secondary | ICD-10-CM | POA: Diagnosis not present

## 2021-07-21 DIAGNOSIS — Z9221 Personal history of antineoplastic chemotherapy: Secondary | ICD-10-CM | POA: Diagnosis not present

## 2021-07-21 DIAGNOSIS — Z923 Personal history of irradiation: Secondary | ICD-10-CM | POA: Diagnosis not present

## 2021-07-21 DIAGNOSIS — R0602 Shortness of breath: Secondary | ICD-10-CM | POA: Diagnosis not present

## 2021-07-21 DIAGNOSIS — D72829 Elevated white blood cell count, unspecified: Secondary | ICD-10-CM | POA: Diagnosis not present

## 2021-07-21 DIAGNOSIS — Z7189 Other specified counseling: Secondary | ICD-10-CM | POA: Diagnosis not present

## 2021-07-21 DIAGNOSIS — Z5111 Encounter for antineoplastic chemotherapy: Secondary | ICD-10-CM | POA: Diagnosis not present

## 2021-07-21 LAB — CBC WITH DIFFERENTIAL/PLATELET
Abs Immature Granulocytes: 4.06 10*3/uL — ABNORMAL HIGH (ref 0.00–0.07)
Basophils Absolute: 0.2 10*3/uL — ABNORMAL HIGH (ref 0.0–0.1)
Basophils Relative: 1 %
Eosinophils Absolute: 0 10*3/uL (ref 0.0–0.5)
Eosinophils Relative: 0 %
HCT: 26.7 % — ABNORMAL LOW (ref 36.0–46.0)
Hemoglobin: 8.8 g/dL — ABNORMAL LOW (ref 12.0–15.0)
Immature Granulocytes: 19 %
Lymphocytes Relative: 20 %
Lymphs Abs: 4.2 10*3/uL — ABNORMAL HIGH (ref 0.7–4.0)
MCH: 30.8 pg (ref 26.0–34.0)
MCHC: 33 g/dL (ref 30.0–36.0)
MCV: 93.4 fL (ref 80.0–100.0)
Monocytes Absolute: 8.5 10*3/uL — ABNORMAL HIGH (ref 0.1–1.0)
Monocytes Relative: 41 %
Neutro Abs: 4 10*3/uL (ref 1.7–7.7)
Neutrophils Relative %: 19 %
Platelets: 19 10*3/uL — CL (ref 150–400)
RBC: 2.86 MIL/uL — ABNORMAL LOW (ref 3.87–5.11)
RDW: 18.7 % — ABNORMAL HIGH (ref 11.5–15.5)
Smear Review: NORMAL
WBC: 21 10*3/uL — ABNORMAL HIGH (ref 4.0–10.5)
nRBC: 0.5 % — ABNORMAL HIGH (ref 0.0–0.2)

## 2021-07-21 LAB — APTT: aPTT: 40 seconds — ABNORMAL HIGH (ref 24–36)

## 2021-07-21 LAB — TYPE AND SCREEN
ABO/RH(D): O POS
Antibody Screen: NEGATIVE

## 2021-07-21 LAB — PHOSPHORUS: Phosphorus: 2.8 mg/dL (ref 2.5–4.6)

## 2021-07-21 LAB — LACTATE DEHYDROGENASE: LDH: 718 U/L — ABNORMAL HIGH (ref 98–192)

## 2021-07-21 LAB — URIC ACID: Uric Acid, Serum: 5.9 mg/dL (ref 2.5–7.1)

## 2021-07-21 MED ORDER — SODIUM CHLORIDE 0.9 % IV SOLN
Freq: Once | INTRAVENOUS | Status: AC
  Filled 2021-07-21: qty 250

## 2021-07-21 MED ORDER — SODIUM CHLORIDE 0.9% IV SOLUTION
250.0000 mL | Freq: Once | INTRAVENOUS | Status: AC
  Administered 2021-07-21: 250 mL via INTRAVENOUS
  Filled 2021-07-21: qty 250

## 2021-07-21 MED ORDER — ACYCLOVIR 400 MG PO TABS: 400.0000 mg | ORAL_TABLET | Freq: Two times a day (BID) | ORAL | 4 refills | Status: AC

## 2021-07-21 MED ORDER — MIDAZOLAM HCL 2 MG/2ML IJ SOLN
INTRAMUSCULAR | Status: AC
  Filled 2021-07-21: qty 2

## 2021-07-21 MED ORDER — FENTANYL CITRATE (PF) 100 MCG/2ML IJ SOLN
INTRAMUSCULAR | Status: AC
  Filled 2021-07-21: qty 2

## 2021-07-21 MED ORDER — FENTANYL CITRATE (PF) 100 MCG/2ML IJ SOLN
INTRAMUSCULAR | Status: AC | PRN
  Administered 2021-07-21 (×2): 25 ug via INTRAVENOUS

## 2021-07-21 MED ORDER — HEPARIN SOD (PORK) LOCK FLUSH 100 UNIT/ML IV SOLN
INTRAVENOUS | Status: AC
  Filled 2021-07-21: qty 5

## 2021-07-21 MED ORDER — MIDAZOLAM HCL 2 MG/2ML IJ SOLN
INTRAMUSCULAR | Status: AC | PRN
  Administered 2021-07-21 (×2): 0.5 mg via INTRAVENOUS

## 2021-07-21 MED ORDER — SODIUM CHLORIDE 0.9 % IV SOLN: INTRAVENOUS | Status: DC

## 2021-07-21 MED ORDER — LEVOFLOXACIN 500 MG PO TABS: 500.0000 mg | ORAL_TABLET | Freq: Every day | ORAL | 3 refills | Status: AC

## 2021-07-21 MED ORDER — VENETOCLAX 100 MG PO TABS: 400.0000 mg | ORAL_TABLET | Freq: Every day | ORAL | 5 refills | Status: AC

## 2021-07-21 MED ORDER — FAMOTIDINE 20 MG IN NS 100 ML IVPB
20.0000 mg | Freq: Once | INTRAVENOUS | Status: AC
  Administered 2021-07-21: 20 mg via INTRAVENOUS
  Filled 2021-07-21: qty 20

## 2021-07-21 NOTE — Progress Notes (Signed)
1335: pt reports "indigestion" pain in center of chest rated a 4 out of 10  1339: Pt reports pain has resolved. Temp 99.8 Per Dr. Janese Banks okay to proceed with platelets as scheduled.  1354: While starting platelets pt reports "indigestion" pain in left center chest (pointing to breastbone region). Per Dr. Janese Banks give 20 mg IV Pepcid. Per Dr. Janese Banks okay to begin 2nd IV site and give Pepcid at the same time as platelets through different IV sites.  1440: pt reports that the pain was across the base of her chest and now is it localized to left side. Dr. Janese Banks aware.   1443: Dr. Janese Banks at chairside. Pt declines pain medication. Per Dr. Janese Banks okay to d/c PIVs.   Pt to be seen in clinic with family.   1502: pt transferred to MD clinic.

## 2021-07-21 NOTE — Procedures (Signed)
Interventional Radiology Procedure Note  Procedure: Bone marrow biopsy and aspiration  Indication: Anemia and thrombocytopenia  Findings: Please refer to procedural dictation for full description.  Complications: None  EBL: < 10 mL  Miachel Roux, MD [phone]

## 2021-07-21 NOTE — Patient Instructions (Signed)
Thrombocytopenia Thrombocytopenia means that you have a low number of platelets in your blood. Platelets are tiny cells in the blood. When you bleed, they clump together at the cut or injury to stop the bleeding. This is called blood clotting. If you do not have enough platelets, it can cause bleeding problems. Some cases ofthis condition are mild while others are more severe. What are the causes? This condition may be caused by: Your body not making enough platelets. This may be caused by: Your bone marrow not making blood cells (aplastic anemia). Cancer in the bone marrow. Certain medicines. Infection in the bone marrow. Drinking a lot of alcohol. Your body destroying platelets too quickly. This may be caused by: Certain immune diseases. Certain medicines. Certain blood clotting disorders. Certain disorders that are passed from parent to child (inherited). Certain bleeding disorders. Pregnancy. Having a spleen that is larger than normal. What are the signs or symptoms? Bleeding that is not normal. Nosebleeds. Heavy menstrual periods. Blood in the pee (urine) or poop (stool). A purple-like color to the skin (purpura). Bruising. A rash that looks like pinpoint, purple-red spots (petechiae). How is this treated? Treatment of another condition that is causing the low platelet count. Medicines to help protect your platelets from being destroyed. A replacement (transfusion) of platelets to stop or prevent bleeding. Surgery to remove the spleen. Follow these instructions at home: Activity Avoid activities that could cause you to get hurt or bruised. Follow instructions about how to prevent falls. Take care not to cut yourself: When you shave. When you use scissors, needles, knives, or other tools. Take care not to burn yourself: When you use an iron. When you cook. General instructions  Check your skin and the inside of your mouth for bruises or blood as told by your  doctor. Check to see if there is blood in your spit (sputum), pee, and poop. Do this as told by your doctor. Do not drink alcohol. Take over-the-counter and prescription medicines only as told by your doctor. Do not take any medicines that have aspirin or NSAIDs in them. These medicines can thin your blood and cause you to bleed. Tell all of your doctors that you have this condition. Be sure to tell your dentist and eye doctor too.  Contact a doctor if: You have bruises and you do not know why. Get help right away if: You are bleeding anywhere on your body. You have blood in your spit, pee, or poop. Summary Thrombocytopenia means that you have a low number of platelets in your blood. Platelets are needed for blood clotting. Symptoms of this condition include bleeding that is not normal, and bruising. Take care not to cut or burn yourself. This information is not intended to replace advice given to you by your health care provider. Make sure you discuss any questions you have with your healthcare provider. Document Revised: 08/25/2018 Document Reviewed: 08/25/2018 Elsevier Patient Education  [place].

## 2021-07-21 NOTE — Progress Notes (Signed)
Patient clinically stable post BMB per Dr Dwaine Gale, tolerated well. Vitals stable pre and post procedure. Denies complaints at this time. Received Versed 1 mg along with Fentanyl 50 mcg IV for procedure. Awake/alert and oriented post procedure. Report given to [facility] in specials post procedure. Upon arrival this am for procedure. Patient stated shortness of breath which has been ongoing. Sats on room air 89-90%, stated she is suppose to be on oxygen at home but unable to get supplied. Reached out to [facility] in [place] center and personnel here now with Oxygen supplies to go home with.

## 2021-07-21 NOTE — Progress Notes (Signed)
Pt will need meds when she starts on chemo

## 2021-07-21 NOTE — H&P (Signed)
Chief Complaint: Patient was seen in consultation today for anemia and thrombocytopenia at the request of Rao,Archana C  Referring Physician(s): Sindy Guadeloupe  Supervising Physician: Mir, Sharen Heck  Patient Status: [facility] - Out-pt  History of Present Illness: Melissa [name] is a 75 y.o. female with PMHx significant for anal cancer in 2004, patient has been feeling fatigue and experiencing shortness of breath which brought her to the ED department 8/10. At the ED lab work was done and revealed anemia and thrombocytopenia as well as abnormal pathology smear showing leukocytosis with immature cells/blasts, pseudo pelgeroid changes and hypogranular neutrophils.  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 The patient has been seen by Dr. Janese Banks and request has been received for image guided bone marrow biopsy today. The patient does admit to some chest heaviness that is not new today and intermittent, she does admit to shortness of breath. The patient denies any history of sleep apnea, she is now on oxygen at 2L. She has previously tolerated sedation without complications.    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  Chemotherapy and radiation.   Anemia    Basal cell carcinoma 09/30/2020   upper back, [facility] 11/11/2020   Depression    Diffuse cystic mastopathy 12/30/2012   Dry mouth    Fibroid    Fibromyalgia 2006   Hemorrhoids 2006   Hypothyroidism    Migraines 2006   Other nonspecific finding on examination of urine 02/20/2013   Personal history of chemotherapy    skin, rectal   Personal history of radiation therapy    rectal   Sciatica    Tubular adenoma of colon 07/08/2016   Vulvitis 11/22/2015    Past Surgical History:  Procedure Laterality Date   ANAL MASS EXC  01/12/2003   Operative report described the nodular area of invasive squamous cell carcinoma anywhere  from the 3-6 o'clock position multiple procedure notes   anal ulcer  04/2016   SQUAMOUS MUCOSA WITH ACUTE ACTIVE ULCER AND REACTIVE HYPERPLASIA   BREAST BIOPSY Bilateral    neg   BREAST SURGERY  2005   BREAST REDUCTION SX Breast Biopsy (left) Neg for cancer   CATARACT EXTRACTION     COLONOSCOPY  2008, 2013   COLONOSCOPY WITH PROPOFOL N/A 07/08/2016   tubular adenoma/ COLONOSCOPY WITH PROPOFOL;  Surgeon: Christene Lye, MD;  Location: [facility] ENDOSCOPY;  Service: Endoscopy;  Laterality: N/A;   COSMETIC SURGERY  20008   FACIAL   REDUCTION MAMMAPLASTY Bilateral 1998   TONSILLECTOMY  2005   VAGINAL HYSTERECTOMY  1986   Secondary to Fibriods    Allergies: Pentazocine lactate, Propoxyphene n-acetaminophen, Talwin  [pentazocine], Tramadol hcl, Ciprofloxacin hcl, Darvon  [propoxyphene], Dicloxacillin, and Etodolac  Medications: Prior to Admission medications   Medication Sig Start Date End Date Taking? Authorizing Provider  acetaminophen (TYLENOL) 650 MG CR tablet Take 650 mg by mouth daily as needed for pain.   Yes [provider]  allopurinol (ZYLOPRIM) 300 MG tablet Take 1 tablet (300 mg total) by mouth daily. 07/18/21  Yes Sindy Guadeloupe, MD  Bioflavonoid Products (BIOFLEX PO) Take by mouth daily.   Yes [provider]  buPROPion (WELLBUTRIN XL) 150 MG 24 hr tablet TAKE 1 TABLET BY MOUTH EVERY DAY 06/03/21  Yes Jerrol Banana., MD  cholecalciferol (VITAMIN D) 1000 units tablet Take 5,000 Units by mouth daily.  Yes [provider]  cholestyramine (QUESTRAN) 4 g packet DISSOLVE 1 PACKET (4 G TOTAL) IN LIQUID AND TAKE BY MOUTH 2 (TWO) TIMES DAILY. 02/11/21  Yes Jerrol Banana., MD  clobetasol ointment (TEMOVATE) 0.71 % Apply 1 application topically 2 (two) times daily. Twice a day for 14 days then daily for 4 weeks 07/08/21  Yes Harlin Heys, MD  diphenhydrAMINE (BENADRYL) 25 MG tablet Take 25 mg by mouth every 6 (six) hours as needed for itching.     Yes [provider]  ibuprofen (ADVIL) 600 MG tablet Take 600 mg by mouth every 6 (six) hours as needed.   Yes [provider]  loperamide (IMODIUM) 2 MG capsule Take 2 mg by mouth 4 (four) times daily as needed for diarrhea or loose stools.   Yes [provider]  LORazepam (ATIVAN) 0.5 MG tablet Take 1 tablet (0.5 mg total) by mouth every 6 (six) hours as needed (Nausea or vomiting). 07/18/21  Yes Sindy Guadeloupe, MD  Morphine Sulfate (MORPHINE CONCENTRATE) 10 mg / 0.5 ml concentrated solution Take 0.25 mLs (5 mg total) by mouth every 4 (four) hours as needed for severe pain or shortness of breath. 07/18/21  Yes Sindy Guadeloupe, MD  Multiple Vitamins-Minerals (MULTIVITAL) tablet Take 1 tablet by mouth daily.   Yes [provider]  nabumetone (RELAFEN) 500 MG tablet TAKE 1 TABLET BY MOUTH TWICE A DAY AS NEEDED 05/09/21  Yes Jerrol Banana., MD  Omega-3 Fatty Acids (FISH OIL) 1200 MG CAPS Take by mouth daily.    Yes [provider]  ondansetron (ZOFRAN) 8 MG tablet Take 1 tablet (8 mg total) by mouth 2 (two) times daily as needed (Nausea or vomiting). 07/18/21  Yes Sindy Guadeloupe, MD  sertraline (ZOLOFT) 100 MG tablet TAKE 1 TABLET BY MOUTH TWICE A DAY 05/09/21  Yes Jerrol Banana., MD  levothyroxine (SYNTHROID) 175 MCG tablet TAKE 1 TABLET BY MOUTH DAILY BEFORE BREAKFAST 08/14/20   Jerrol Banana., MD  lidocaine-prilocaine (EMLA) cream Apply to affected area once 07/18/21   Sindy Guadeloupe, MD  OXYCODONE-ACETAMINOPHEN PO Take 1 tablet by mouth every 4 (four) hours as needed. 5 mg Patient not taking: No sig reported    [provider]  Probiotic Product (DIGESTIVE ADVANTAGE PO) Take 1 capsule by mouth 2 (two) times daily.    [provider]  prochlorperazine (COMPAZINE) 10 MG tablet Take 1 tablet (10 mg total) by mouth every 6 (six) hours as needed (Nausea or vomiting). 07/18/21   Sindy Guadeloupe, MD     Family History  Problem  Relation Age of Onset   Diabetes Father    Cancer Father    Stroke Sister    Colitis Sister    Breast cancer Paternal Aunt    Heart disease Maternal Grandmother    Ovarian cancer Neg Hx     Social History   Socioeconomic History   Marital status: Married    Spouse name: Not on file   Number of children: 2   Years of education: Not on file   Highest education level: Associate degree: occupational, Hotel manager, or vocational program  Occupational History   Occupation: retired  Tobacco Use   Smoking status: Never   Smokeless tobacco: Never  Vaping Use   Vaping Use: Never used  Substance and Sexual Activity   Alcohol use: No   Drug use: No   Sexual activity: Not Currently    Birth  control/protection: Surgical  Other Topics Concern   Not on file  Social History Narrative   Not on file   Social Determinants of Health   Financial Resource Strain: Not on file  Food Insecurity: Not on file  Transportation Needs: Not on file  Physical Activity: Not on file  Stress: Not on file  Social Connections: Not on file    Review of Systems: A 12 point ROS discussed and pertinent positives are indicated in the HPI above.  All other systems are negative.  Review of Systems  Vital Signs: BP 136/64   Pulse 81   Temp 99.2 F (37.3 C) (Oral)   Resp 20   Ht _0  (1.549 m)   Wt 174 lb (78.9 kg)   SpO2 90% Comment: placed on 2l/min oxygen, patient states uses o2 prn at home  BMI 32.88 kg/m   Physical Exam Constitutional:      Appearance: Normal appearance.  HENT:     Head: Normocephalic and atraumatic.  Cardiovascular:     Rate and Rhythm: Normal rate.  Pulmonary:     Effort: Pulmonary effort is normal. No respiratory distress.  Abdominal:     General: Abdomen is flat.     Palpations: Abdomen is soft.  Skin:    General: Skin is warm and dry.  Neurological:     Mental Status: She is alert and oriented to person, place, and time.  Psychiatric:        Thought Content:  Thought content normal.   Imaging: DG Chest 2 View  Result Date: 07/16/2021 CLINICAL DATA:  Dyspnea with exertion. EXAM: CHEST - 2 VIEW COMPARISON:  May 09, 2015. FINDINGS: The heart size and mediastinal contours are within normal limits. Both lungs are clear. The visualized skeletal structures are unremarkable. IMPRESSION: No active cardiopulmonary disease. Electronically Signed   By: Marijo Conception M.D.   On: 07/16/2021 15:08   CT Angio Chest PE W and/or Wo Contrast  Result Date: 07/16/2021 CLINICAL DATA:  Concern for pulmonary embolism. Receiving hyperbaric wound treatment related to remote pelvic radiation for anal rectal cancer. EXAM: CT ANGIOGRAPHY CHEST CT ABDOMEN AND PELVIS WITH CONTRAST TECHNIQUE: Multidetector CT imaging of the chest was performed using the standard protocol during bolus administration of intravenous contrast. Multiplanar CT image reconstructions and MIPs were obtained to evaluate the vascular anatomy. Multidetector CT imaging of the abdomen and pelvis was performed using the standard protocol during bolus administration of intravenous contrast. CONTRAST:  65m OMNIPAQUE IOHEXOL 350 MG/ML SOLN COMPARISON:  CT 12/25/2014 FINDINGS: CTA CHEST FINDINGS Cardiovascular: No filling defects within the pulmonary arteries to suggest acute pulmonary embolism. No significant vascular findings. Normal heart size. No pericardial effusion. Mediastinum/Nodes: No axillary or supraclavicular adenopathy. No mediastinal or hilar adenopathy. No pericardial fluid. Esophagus normal. Lungs/Pleura: No suspicious pulmonary nodules.  Airways normal Musculoskeletal: No acute osseous abnormality. Review of the MIP images confirms the above findings. CT ABDOMEN and PELVIS FINDINGS Hepatobiliary: Several benign hepatic cysts in central RIGHT liver again demonstrated. Enhancing hepatic lesion. Gallbladder is collapsed. Pancreas: Pancreas is normal. No ductal dilatation. No pancreatic inflammation. Spleen:  Normal spleen Adrenals/urinary tract: Adrenal glands and kidneys are normal. The bladder has uniform thickened wall to 8 mm with enhancing mucosal surface. The serosal surface is hazy. No bladder distension. Stomach/Bowel: Stomach, small bowel, appendix, and cecum are normal. The colon and rectosigmoid colon are normal. Vascular/Lymphatic: Abdominal aorta is normal caliber with atherosclerotic calcification. There is no retroperitoneal or periportal lymphadenopathy. No pelvic lymphadenopathy. Reproductive:  Post hysterectomy.  Adnexa unremarkable Other: No perineum abscess identified. No free fluid or abscess in the pelvis Musculoskeletal: No aggressive osseous lesion. Review of the MIP images confirms the above findings. IMPRESSION: Chest Impression: 1. No evidence acute pulmonary embolism. 2. No acute pulmonary parenchymal findings. 3. Abdomen / Pelvis Impression: 1. Bladder wall thickening and hazy serosal surface suggests cystitis. 2. No evidence of abscess in the pelvis. 3. No evidence of metastatic disease in the abdomen pelvis. 4.  Aortic Atherosclerosis (ICD10-I70.0). Electronically Signed   By: Suzy Bouchard M.D.   On: 07/16/2021 18:39   CT ABDOMEN PELVIS W CONTRAST  Result Date: 07/16/2021 CLINICAL DATA:  Concern for pulmonary embolism. Receiving hyperbaric wound treatment related to remote pelvic radiation for anal rectal cancer. EXAM: CT ANGIOGRAPHY CHEST CT ABDOMEN AND PELVIS WITH CONTRAST TECHNIQUE: Multidetector CT imaging of the chest was performed using the standard protocol during bolus administration of intravenous contrast. Multiplanar CT image reconstructions and MIPs were obtained to evaluate the vascular anatomy. Multidetector CT imaging of the abdomen and pelvis was performed using the standard protocol during bolus administration of intravenous contrast. CONTRAST:  108m OMNIPAQUE IOHEXOL 350 MG/ML SOLN COMPARISON:  CT 12/25/2014 FINDINGS: CTA CHEST FINDINGS Cardiovascular: No filling  defects within the pulmonary arteries to suggest acute pulmonary embolism. No significant vascular findings. Normal heart size. No pericardial effusion. Mediastinum/Nodes: No axillary or supraclavicular adenopathy. No mediastinal or hilar adenopathy. No pericardial fluid. Esophagus normal. Lungs/Pleura: No suspicious pulmonary nodules.  Airways normal Musculoskeletal: No acute osseous abnormality. Review of the MIP images confirms the above findings. CT ABDOMEN and PELVIS FINDINGS Hepatobiliary: Several benign hepatic cysts in central RIGHT liver again demonstrated. Enhancing hepatic lesion. Gallbladder is collapsed. Pancreas: Pancreas is normal. No ductal dilatation. No pancreatic inflammation. Spleen: Normal spleen Adrenals/urinary tract: Adrenal glands and kidneys are normal. The bladder has uniform thickened wall to 8 mm with enhancing mucosal surface. The serosal surface is hazy. No bladder distension. Stomach/Bowel: Stomach, small bowel, appendix, and cecum are normal. The colon and rectosigmoid colon are normal. Vascular/Lymphatic: Abdominal aorta is normal caliber with atherosclerotic calcification. There is no retroperitoneal or periportal lymphadenopathy. No pelvic lymphadenopathy. Reproductive: Post hysterectomy.  Adnexa unremarkable Other: No perineum abscess identified. No free fluid or abscess in the pelvis Musculoskeletal: No aggressive osseous lesion. Review of the MIP images confirms the above findings. IMPRESSION: Chest Impression: 1. No evidence acute pulmonary embolism. 2. No acute pulmonary parenchymal findings. 3. Abdomen / Pelvis Impression: 1. Bladder wall thickening and hazy serosal surface suggests cystitis. 2. No evidence of abscess in the pelvis. 3. No evidence of metastatic disease in the abdomen pelvis. 4.  Aortic Atherosclerosis (ICD10-I70.0). Electronically Signed   By: SSuzy BouchardM.D.   On: 07/16/2021 18:39    Labs:  CBC: Recent Labs    07/16/21 1410 07/18/21 1055  07/21/21 0801  WBC 13.4* 15.5* 21.0*  HGB 7.7* 8.6* 8.8*  HCT 24.5* 26.7* 26.7*  PLT 17* 15* 19*    COAGS: Recent Labs    07/16/21 1917 07/21/21 0801  INR 1.3*  --   APTT 39* 40*    BMP: Recent Labs    07/16/21 1410  NA 138  K 3.9  CL 106  CO2 23  GLUCOSE 135*  BUN 19  CALCIUM 8.9  CREATININE 1.10*  GFRNONAA 52*    LIVER FUNCTION TESTS: Recent Labs    07/16/21 1410  BILITOT 0.6  AST 26  ALT 16  ALKPHOS 83  PROT 7.4  ALBUMIN 3.6  Assessment and Plan: Fatigue with shortness of breath History of anal cancer in 2004  Anemia with thrombocytopenia and abnormal pathology smear, blood transfusion received in ED 8/10 Patient has been seen by Dr. Janese Banks with request received for image guided bone marrow biopsy today The patient has been NPO, labs and vitals have been reviewed.  Risks and benefits of image guided bone marrow biopsy was discussed with the patient and/or patient's family including, but not limited to bleeding, infection, damage to adjacent structures or low yield requiring additional tests. All of the questions were answered and there is agreement to proceed. Consent signed and in chart.   Thank you for this interesting consult.  I greatly enjoyed meeting Melissa [name] and look forward to participating in their care.  A copy of this report was sent to the requesting provider on this date.  Electronically Signed: Hedy Jacob, PA-C 07/21/2021, 8:42 AM   I spent a total of 15 Minutes in face to face in clinical consultation, greater than 50% of which was counseling/coordinating care for anemia and thrombocytopenia.

## 2021-07-21 NOTE — Progress Notes (Signed)
Pt added on to go over treatment

## 2021-07-22 ENCOUNTER — Inpatient Hospital Stay: Payer: PPO | Admitting: Oncology

## 2021-07-22 ENCOUNTER — Inpatient Hospital Stay: Payer: PPO

## 2021-07-22 ENCOUNTER — Telehealth: Payer: Self-pay | Admitting: Pharmacist

## 2021-07-22 ENCOUNTER — Other Ambulatory Visit: Payer: Self-pay | Admitting: Oncology

## 2021-07-22 ENCOUNTER — Encounter: Payer: PPO | Admitting: Physician Assistant

## 2021-07-22 VITALS — BP 136/60 | HR 82 | Temp 99.0°F | Resp 20

## 2021-07-22 DIAGNOSIS — Z5111 Encounter for antineoplastic chemotherapy: Secondary | ICD-10-CM | POA: Diagnosis not present

## 2021-07-22 DIAGNOSIS — C9200 Acute myeloblastic leukemia, not having achieved remission: Secondary | ICD-10-CM

## 2021-07-22 LAB — PREPARE PLATELET PHERESIS: Unit division: 0

## 2021-07-22 LAB — GLUCOSE 6 PHOSPHATE DEHYDROGENASE
G6PDH: 10.5 U/g{Hb} (ref 4.8–15.7)
Hemoglobin: 8.7 g/dL — ABNORMAL LOW (ref 11.1–15.9)

## 2021-07-22 LAB — BASIC METABOLIC PANEL WITH GFR
Anion gap: 10 (ref 5–15)
BUN: 11 mg/dL (ref 8–23)
CO2: 22 mmol/L (ref 22–32)
Calcium: 8.2 mg/dL — ABNORMAL LOW (ref 8.9–10.3)
Chloride: 102 mmol/L (ref 98–111)
Creatinine, Ser: 0.69 mg/dL (ref 0.44–1.00)
GFR, Estimated: >60 mL/min
Glucose, Bld: 137 mg/dL — ABNORMAL HIGH (ref 70–99)
Potassium: 3.5 mmol/L (ref 3.5–5.1)
Sodium: 134 mmol/L — ABNORMAL LOW (ref 135–145)

## 2021-07-22 LAB — BPAM PLATELET PHERESIS
Blood Product Expiration Date: 202208162359
ISSUE DATE / TIME: 202208151347
Unit Type and Rh: 600

## 2021-07-22 MED ORDER — ONDANSETRON HCL 4 MG PO TABS
8.0000 mg | ORAL_TABLET | Freq: Once | ORAL | Status: AC
  Administered 2021-07-22: 8 mg via ORAL
  Filled 2021-07-22: qty 2

## 2021-07-22 MED ORDER — AZACITIDINE CHEMO SQ INJECTION
75.0000 mg/m2 | Freq: Once | INTRAMUSCULAR | Status: AC
  Administered 2021-07-22: 140 mg via SUBCUTANEOUS
  Filled 2021-07-22: qty 5.6

## 2021-07-22 NOTE — Progress Notes (Signed)
Per Dr. Janese Banks, okay to proceed with treatment today despite PLT count from yesterday. Dr. Janese Banks also states okay to use Creatinine results from 07/16/21 for treatment today as well. Patient is c/o right hip pain from Bone Marrow BX. I assessed the site and some bruising was present but no redness, swelling, or drainage was noted. Informed Dr. Janese Banks of pain and per Dr. Janese Banks, inform patient this is to be expected but should improve over next few days. Educated patient on s/s as to when to seek care for site. Patient verbalizes understanding and denies any further questions or concerns.

## 2021-07-22 NOTE — Patient Instructions (Signed)
[facility]  Discharge Instructions: Thank you for choosing [facility] to provide your oncology and hematology care.  If you have a lab appointment with the [facility], please go directly to the [facility] and check in at the registration area.  Wear comfortable clothing and clothing appropriate for easy access to any Portacath or PICC line.   We strive to give you quality time with your provider. You may need to reschedule your appointment if you arrive late (15 or more minutes).  Arriving late affects you and other patients whose appointments are after yours.  Also, if you miss three or more appointments without notifying the office, you may be dismissed from the clinic at the provider's discretion.      For prescription refill requests, have your pharmacy contact our office and allow 72 hours for refills to be completed.    Today you received the following chemotherapy and/or immunotherapy agents Vidaza       To help prevent nausea and vomiting after your treatment, we encourage you to take your nausea medication as directed.  BELOW ARE SYMPTOMS THAT SHOULD BE REPORTED IMMEDIATELY: *FEVER GREATER THAN 100.4 F (38 C) OR HIGHER *CHILLS OR SWEATING *NAUSEA AND VOMITING THAT IS NOT CONTROLLED WITH YOUR NAUSEA MEDICATION *UNUSUAL SHORTNESS OF BREATH *UNUSUAL BRUISING OR BLEEDING *URINARY PROBLEMS (pain or burning when urinating, or frequent urination) *BOWEL PROBLEMS (unusual diarrhea, constipation, pain near the anus) TENDERNESS IN MOUTH AND THROAT WITH OR WITHOUT PRESENCE OF ULCERS (sore throat, sores in mouth, or a toothache) UNUSUAL RASH, SWELLING OR PAIN  UNUSUAL VAGINAL DISCHARGE OR ITCHING   Items with * indicate a potential emergency and should be followed up as soon as possible or go to the Emergency Department if any problems should occur.  Please show the CHEMOTHERAPY ALERT CARD or IMMUNOTHERAPY ALERT CARD at check-in to  the Emergency Department and triage nurse.  Should you have questions after your visit or need to cancel or reschedule your appointment, please contact [facility]  [phone] and follow the prompts.  Office hours are 8:00 a.m. to 4:30 p.m. Monday - Friday. Please note that voicemails left after 4:00 p.m. may not be returned until the following business day.  We are closed weekends and major holidays. You have access to a nurse at all times for urgent questions. Please call the main number to the clinic [phone] and follow the prompts.  For any non-urgent questions, you may also contact your provider using MyChart. We now offer e-Visits for anyone 108 and older to request care online for non-urgent symptoms. For details visit [email or web address].   Also download the MyChart app! Go to the app store, search "MyChart", open the app, select [facility], and log in with your MyChart username and password.  Due to Covid, a mask is required upon entering the hospital/clinic. If you do not have a mask, one will be given to you upon arrival. For doctor visits, patients may have 1 support person aged 62 or older with them. For treatment visits, patients cannot have anyone with them due to current Covid guidelines and our immunocompromised population.

## 2021-07-22 NOTE — Telephone Encounter (Signed)
Oral Chemotherapy Pharmacist Encounter  Successfully enrolled patient for copayment assistance funds from [facility] from the AML fund.  Award amount: 10,000 Effective dates: 06/22/2021 - 06/21/2022 ID: [id number] BIN: [id number] Group: [id number] PCN: [id number]  Billing information will be shared with [facility]. I will place a copy of the award letter to be scanned into patient's chart.  Darl Pikes, PharmD, BCPS Hematology/Oncology Clinical Pharmacist [facility]/HP/AP [facility] [phone]  07/22/2021 8:55 AM

## 2021-07-23 ENCOUNTER — Telehealth: Payer: Self-pay | Admitting: Hospice and Palliative Medicine

## 2021-07-23 ENCOUNTER — Inpatient Hospital Stay: Payer: PPO

## 2021-07-23 ENCOUNTER — Other Ambulatory Visit: Payer: Self-pay | Admitting: Obstetrics and Gynecology

## 2021-07-23 ENCOUNTER — Other Ambulatory Visit: Payer: Self-pay | Admitting: Oncology

## 2021-07-23 ENCOUNTER — Other Ambulatory Visit: Payer: Self-pay | Admitting: Family Medicine

## 2021-07-23 ENCOUNTER — Encounter: Payer: PPO | Admitting: Internal Medicine

## 2021-07-23 DIAGNOSIS — N952 Postmenopausal atrophic vaginitis: Secondary | ICD-10-CM

## 2021-07-23 DIAGNOSIS — Z5111 Encounter for antineoplastic chemotherapy: Secondary | ICD-10-CM | POA: Diagnosis not present

## 2021-07-23 LAB — SURGICAL PATHOLOGY

## 2021-07-23 MED ORDER — MORPHINE SULFATE (CONCENTRATE) 10 MG /0.5 ML PO SOLN: 5.0000 mg | ORAL | 0 refills | Status: AC | PRN

## 2021-07-23 NOTE — Telephone Encounter (Signed)
Dr. Elroy Channel request, I called and spoke with patient and husband by phone.  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  She was started on azacitidine and received dose yesterday.  Today she has profound fatigue and reports a headache.  She has had bleeding from nose, vagina, and rectum.  We discussed option for ER/hospitalization either at [facility] or tertiary center including [facility] or [place].  We also discussed the option of hospice for end-of-life care if patient opted to forego further work-up/treatment.  Patient stated clearly that she just wanted to remain home and be comfortable.  She requested hospice involvement.  Her husband was tearful sounding but also verbalized agreement with hospice for end-of-life care.  Note that I also discussed the option of hospice [facility] but it sounds like patient would prefer to remain home if possible.  I offered to call patient's sons but husband stated that he would prefer to do it.  I called in an order to [facility] hospice and explained the urgent nature of the referral.  Will refill morphine elixir and liberalize dosing/frequency.

## 2021-07-23 NOTE — Progress Notes (Signed)
   [facility]  [phone] [phone]  Patient Care Team: Jerrol Banana., MD as PCP - General (Unknown Physician Specialty) Thelma Comp, OD as Consulting Physician (Optometry) Vevelyn Royals, MD as Consulting Physician (Ophthalmology)   Name of the patient: Melissa [name]  [id number]  [birth year]   Date of visit: 07/23/21  HPI: Patient is a 75 y.o. female with newly diagnosed AML  Reason for Consult: Preparation for venetoclax initiation.   Discussed with patient the process of obtaining venetoclax. Also, collected income and household size information to obtain a grant to cover her expected high copay. Patient knows to expect a follow-up call from me with an update.  Signed by: Darl Pikes, PharmD, BCPS, BCOP, CPP Hematology/Oncology Clinical Pharmacist Practitioner [facility]/HP/AP [facility] [phone]

## 2021-07-23 NOTE — Telephone Encounter (Signed)
Requested Prescriptions  Pending Prescriptions Disp Refills  . cholestyramine (QUESTRAN) 4 g packet [Pharmacy Med Name: CHOLESTYRAMINE PACKET] 180 packet 1    Sig: DISSOLVE 1 PACKET (4 G TOTAL) IN LIQUID AND TAKE BY MOUTH 2 (TWO) TIMES DAILY.     Cardiovascular:  Antilipid - Bile Acid Sequestrants Failed - 07/23/2021  6:19 AM      Failed - Total Cholesterol in normal range and within 360 days    Cholesterol, Total  Date Value Ref Range Status  09/27/2020 232 (H) 100 - 199 mg/dL Final         Failed - LDL in normal range and within 360 days    LDL Chol Calc (NIH)  Date Value Ref Range Status  09/27/2020 131 (H) 0 - 99 mg/dL Final         Failed - Triglycerides in normal range and within 360 days    Triglycerides  Date Value Ref Range Status  09/27/2020 217 (H) 0 - 149 mg/dL Final         Passed - HDL in normal range and within 360 days    HDL  Date Value Ref Range Status  09/27/2020 63 >39 mg/dL Final         Passed - Valid encounter within last 12 months    Recent Outpatient Visits          2 months ago Recurrent major depressive disorder, in full remission [facility])   [facility] Jerrol Banana., MD   3 months ago [place] Jerrol Banana., MD   10 months ago Encounter for [employer] annual wellness exam   [facility] Jerrol Banana., MD   1 year ago Ganglion cyst   [facility] Jerrol Banana., MD   2 years ago Acquired hypothyroidism   [facility] Jerrol Banana., MD             . levothyroxine (SYNTHROID) 175 MCG tablet [Pharmacy Med Name: LEVOTHYROXINE 175 MCG TABLET] 90 tablet 2    Sig: TAKE 1 TABLET BY MOUTH EVERY DAY BEFORE BREAKFAST     Endocrinology:  Hypothyroid Agents Failed - 07/23/2021  6:19 AM      Failed - TSH needs to be rechecked within 3 months after an abnormal result. Refill until TSH is due.      Passed - TSH in normal range  and within 360 days    TSH  Date Value Ref Range Status  09/27/2020 2.230 0.450 - 4.500 uIU/mL Final         Passed - Valid encounter within last 12 months    Recent Outpatient Visits          2 months ago Recurrent major depressive disorder, in full remission [facility])   [facility] Jerrol Banana., MD   3 months ago [place] Jerrol Banana., MD   10 months ago Encounter for [employer] annual wellness exam   [facility] Jerrol Banana., MD   1 year ago Ganglion cyst   [facility] Jerrol Banana., MD   2 years ago Acquired hypothyroidism   [facility] Jerrol Banana., MD

## 2021-07-24 ENCOUNTER — Inpatient Hospital Stay: Payer: PPO

## 2021-07-24 ENCOUNTER — Encounter: Payer: PPO | Admitting: Physician Assistant

## 2021-07-24 ENCOUNTER — Ambulatory Visit: Payer: PPO

## 2021-07-24 DIAGNOSIS — R110 Nausea: Secondary | ICD-10-CM | POA: Diagnosis not present

## 2021-07-24 DIAGNOSIS — R0902 Hypoxemia: Secondary | ICD-10-CM | POA: Diagnosis not present

## 2021-07-24 DIAGNOSIS — R29898 Other symptoms and signs involving the musculoskeletal system: Secondary | ICD-10-CM | POA: Diagnosis not present

## 2021-07-24 DIAGNOSIS — Z7189 Other specified counseling: Secondary | ICD-10-CM | POA: Insufficient documentation

## 2021-07-24 DIAGNOSIS — I959 Hypotension, unspecified: Secondary | ICD-10-CM | POA: Diagnosis not present

## 2021-07-24 NOTE — Progress Notes (Signed)
Hematology/Oncology Consult note [facility]  [phone] [phone]  Patient Care Team: Jerrol Banana., MD as PCP - General (Unknown Physician Specialty) Thelma Comp, [place] as Consulting Physician (Optometry) Vevelyn Royals, MD as Consulting Physician (Ophthalmology)   Name of the patient: Melissa [name]  [id number]  12/01/1946   Date of visit: 07/24/21  Diagnosis- acute myeloid leukemia  Chief complaint/ Reason for visit- discuss further management of acute myeloid leukemia  Heme/Onc history: patient is a 75 year old female with a past medical history significant for anal cancer that was treated back in 2004With concurrent chemoradiation.  This was complicated by radiation necrosis and patient was receiving hyperbaric oxygen therapy with Dr. Fransisca Connors from GYN oncology at [facility].  Patient came to the ER with symptoms of worsening fatigue and exertional shortness of breath.  In the ER patient was found to have white count of 13, H&H of 7.7/24.5 with an MCV of 96 and a platelet count of 17.  A year ago patient had a normal WBC count of 5, H&H of 12.1/36.8 and a platelet count of 198.  Pathology smear review showed leukocytosis with immature cells/blasts, pseudo pelgeroid changes and hypogranular neutrophils.  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  I was called about these findings and I ordered additional blood work including flow cytometry for the patient.  Patient received 1 unit of blood transfusion and was discharged from the ER.  Bone marrow biopsy was done today.  Again discussed the results of flow cytometry which showed42% blasts in her peripheral blood consistent with acute myeloid leukemia.  Intelligent myeloid panel is currently pending.  Interval history-patient continues to feel poorly.  She has ongoing rectal and vaginal bleeding and occasional nosebleeds.  She reports significant fatigue and  exertional shortness of breath.reported chest wall discomfort while receiving platelets today  ECOG PS- 2-3 Pain scale- 4 Opioid associated constipation- no  Review of systems- Review of Systems  Constitutional:  Positive for malaise/fatigue. Negative for chills, fever and weight loss.  HENT:  Negative for congestion, ear discharge and nosebleeds.   Eyes:  Negative for blurred vision.  Respiratory:  Positive for shortness of breath. Negative for cough, hemoptysis, sputum production and wheezing.   Cardiovascular:  Negative for chest pain, palpitations, orthopnea and claudication.  Gastrointestinal:  Positive for blood in stool. Negative for abdominal pain, constipation, diarrhea, heartburn, melena, nausea and vomiting.  Genitourinary:  Negative for dysuria, flank pain, frequency, hematuria and urgency.       Vaginal bleeding  Musculoskeletal:  Negative for back pain, joint pain and myalgias.  Skin:  Negative for rash.  Neurological:  Negative for dizziness, tingling, focal weakness, seizures, weakness and headaches.  Endo/Heme/Allergies:  Does not bruise/bleed easily.  Psychiatric/Behavioral:  Negative for depression and suicidal ideas. The patient does not have insomnia.       Allergies  Allergen Reactions   Pentazocine Lactate    Propoxyphene N-Acetaminophen    Talwin  [Pentazocine]    Tramadol Hcl    Ciprofloxacin Hcl Rash   Darvon  [Propoxyphene] Rash    Hallucinations Hallucinations.   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  Chemotherapy and radiation.   Anemia    Basal cell carcinoma 09/30/2020   upper back, [facility] 11/11/2020   Depression    Diffuse cystic mastopathy 12/30/2012   Dry  mouth    Fibroid    Fibromyalgia 2006   Hemorrhoids 2006   Hypothyroidism    Migraines 2006   Other nonspecific finding on examination of urine  02/20/2013   Personal history of chemotherapy    skin, rectal   Personal history of radiation therapy    rectal   Sciatica    Tubular adenoma of colon 07/08/2016   Vulvitis 11/22/2015     Past Surgical History:  Procedure Laterality Date   ANAL MASS EXC  01/12/2003   Operative report described the nodular area of invasive squamous cell carcinoma anywhere from the 3-6 o'clock position multiple procedure notes   anal ulcer  04/2016   SQUAMOUS MUCOSA WITH ACUTE ACTIVE ULCER AND REACTIVE HYPERPLASIA   BREAST BIOPSY Bilateral    neg   BREAST SURGERY  2005   BREAST REDUCTION SX Breast Biopsy (left) Neg for cancer   CATARACT EXTRACTION     COLONOSCOPY  2008, 2013   COLONOSCOPY WITH PROPOFOL N/A 07/08/2016   tubular adenoma/ COLONOSCOPY WITH PROPOFOL;  Surgeon: Christene Lye, MD;  Location: [facility] ENDOSCOPY;  Service: Endoscopy;  Laterality: N/A;   COSMETIC SURGERY  20008   FACIAL   REDUCTION MAMMAPLASTY Bilateral 1998   TONSILLECTOMY  2005   VAGINAL HYSTERECTOMY  1986   Secondary to [place] History   Socioeconomic History   Marital status: Married    Spouse name: Not on file   Number of children: 2   Years of education: Not on file   Highest education level: Associate degree: occupational, Hotel manager, or vocational program  Occupational History   Occupation: retired  Tobacco Use   Smoking status: Never   Smokeless tobacco: Never  Vaping Use   Vaping Use: Never used  Substance and Sexual Activity   Alcohol use: No   Drug use: No   Sexual activity: Not Currently    Birth control/protection: Surgical  Other Topics Concern   Not on file  Social History Narrative   Not on file   Social Determinants of Health   Financial Resource Strain: Not on file  Food Insecurity: Not on file  Transportation Needs: Not on file  Physical Activity: Not on file  Stress: Not on file  Social Connections: Not on file  Intimate Partner Violence: Not on file    Family  History  Problem Relation Age of Onset   Diabetes Father    Cancer Father    Stroke Sister    Colitis Sister    Breast cancer Paternal Aunt    Heart disease Maternal Grandmother    Ovarian cancer Neg Hx      Current Outpatient Medications:    acetaminophen (TYLENOL) 650 MG CR tablet, Take 650 mg by mouth daily as needed for pain., Disp: , Rfl:    acyclovir (ZOVIRAX) 400 MG tablet, Take 1 tablet (400 mg total) by mouth 2 (two) times daily., Disp: 60 tablet, Rfl: 4   Bioflavonoid Products (BIOFLEX PO), Take by mouth daily., Disp: , Rfl:    buPROPion (WELLBUTRIN XL) 150 MG 24 hr tablet, TAKE 1 TABLET BY MOUTH EVERY DAY, Disp: 90 tablet, Rfl: 1   cholecalciferol (VITAMIN D) 1000 units tablet, Take 5,000 Units by mouth daily. , Disp: , Rfl:    clobetasol ointment (TEMOVATE) 7.12 %, Apply 1 application topically 2 (two) times daily. Twice a day for 14 days then daily for 4 weeks, Disp: 30 g, Rfl: 3   diphenhydrAMINE (BENADRYL) 25 MG tablet, Take 25  mg by mouth every 6 (six) hours as needed for itching. , Disp: , Rfl:    loperamide (IMODIUM) 2 MG capsule, Take 2 mg by mouth 4 (four) times daily as needed for diarrhea or loose stools., Disp: , Rfl:    Multiple Vitamins-Minerals (MULTIVITAL) tablet, Take 1 tablet by mouth daily., Disp: , Rfl:    nabumetone (RELAFEN) 500 MG tablet, TAKE 1 TABLET BY MOUTH TWICE A DAY AS NEEDED, Disp: 180 tablet, Rfl: 0   Omega-3 Fatty Acids (FISH OIL) 1200 MG CAPS, Take by mouth daily. , Disp: , Rfl:    Probiotic Product (DIGESTIVE ADVANTAGE PO), Take 1 capsule by mouth 2 (two) times daily., Disp: , Rfl:    sertraline (ZOLOFT) 100 MG tablet, TAKE 1 TABLET BY MOUTH TWICE A DAY, Disp: 180 tablet, Rfl: 1   allopurinol (ZYLOPRIM) 300 MG tablet, Take 1 tablet (300 mg total) by mouth daily. (Patient not taking: Reported on 07/21/2021), Disp: 30 tablet, Rfl: 3   cholestyramine (QUESTRAN) 4 g packet, DISSOLVE 1 PACKET (4 G TOTAL) IN LIQUID AND TAKE BY MOUTH 2 (TWO) TIMES  DAILY., Disp: 180 packet, Rfl: 1   ibuprofen (ADVIL) 600 MG tablet, Take 600 mg by mouth every 6 (six) hours as needed. (Patient not taking: Reported on 07/21/2021), Disp: , Rfl:    levofloxacin (LEVAQUIN) 500 MG tablet, Take 1 tablet (500 mg total) by mouth daily. (Patient not taking: Reported on 07/21/2021), Disp: 30 tablet, Rfl: 3   levothyroxine (SYNTHROID) 175 MCG tablet, TAKE 1 TABLET BY MOUTH EVERY DAY BEFORE BREAKFAST, Disp: 90 tablet, Rfl: 2   lidocaine-prilocaine (EMLA) cream, Apply to affected area once (Patient not taking: Reported on 07/21/2021), Disp: 30 g, Rfl: 3   LORazepam (ATIVAN) 0.5 MG tablet, Take 1 tablet (0.5 mg total) by mouth every 6 (six) hours as needed (Nausea or vomiting). (Patient not taking: Reported on 07/21/2021), Disp: 30 tablet, Rfl: 0   Morphine Sulfate (MORPHINE CONCENTRATE) 10 mg / 0.5 ml concentrated solution, Take 0.25-0.5 mLs (5-10 mg total) by mouth every 2 (two) hours as needed for severe pain or shortness of breath., Disp: 30 mL, Rfl: 0   ondansetron (ZOFRAN) 8 MG tablet, Take 1 tablet (8 mg total) by mouth 2 (two) times daily as needed (Nausea or vomiting). (Patient not taking: Reported on 07/21/2021), Disp: 30 tablet, Rfl: 1   OXYCODONE-ACETAMINOPHEN PO, Take 1 tablet by mouth every 4 (four) hours as needed. 5 mg (Patient not taking: No sig reported), Disp: , Rfl:    prochlorperazine (COMPAZINE) 10 MG tablet, Take 1 tablet (10 mg total) by mouth every 6 (six) hours as needed (Nausea or vomiting). (Patient not taking: Reported on 07/21/2021), Disp: 30 tablet, Rfl: 1   venetoclax (VENCLEXTA) 100 MG tablet, Take 4 tablets (400 mg total) by mouth daily. Tablets should be swallowed whole with a meal and a full glass of water.take as directed (Patient not taking: Reported on 07/21/2021), Disp: 120 tablet, Rfl: 5  Physical exam:  Physical Exam Constitutional:      Comments: Sitting in a wheelchair. On home oxygen  Cardiovascular:     Rate and Rhythm: Regular rhythm.  Tachycardia present.     Heart sounds: Normal heart sounds.  Pulmonary:     Effort: Pulmonary effort is normal.     Breath sounds: Normal breath sounds.  Abdominal:     General: Bowel sounds are normal.     Palpations: Abdomen is soft.  Skin:    General: Skin is warm and dry.  Neurological:     Mental Status: She is alert and oriented to person, place, and time.     CMP Latest Ref Rng & Units 07/22/2021  Glucose 70 - 99 mg/dL 137(H)  BUN 8 - 23 mg/dL 11  Creatinine 0.44 - 1.00 mg/dL 0.69  Sodium 135 - 145 mmol/L 134(L)  Potassium 3.5 - 5.1 mmol/L 3.5  Chloride 98 - 111 mmol/L 102  CO2 22 - 32 mmol/L 22  Calcium 8.9 - 10.3 mg/dL 8.2(L)  Total Protein 6.5 - 8.1 g/dL -  Total Bilirubin 0.3 - 1.2 mg/dL -  Alkaline Phos 38 - 126 U/L -  AST 15 - 41 U/L -  ALT 0 - 44 U/L -   CBC Latest Ref Rng & Units 07/21/2021  WBC 4.0 - 10.5 K/uL 21.0(H)  Hemoglobin 11.1 - 15.9 g/dL 8.8(L)  Hematocrit 36.0 - 46.0 % 26.7(L)  Platelets 150 - 400 K/uL 19(LL)    No images are attached to the encounter.  DG Chest 2 View  Result Date: 07/16/2021 CLINICAL DATA:  Dyspnea with exertion. EXAM: CHEST - 2 VIEW COMPARISON:  May 09, 2015. FINDINGS: The heart size and mediastinal contours are within normal limits. Both lungs are clear. The visualized skeletal structures are unremarkable. IMPRESSION: No active cardiopulmonary disease. Electronically Signed   By: Marijo Conception M.D.   On: 07/16/2021 15:08   CT Angio Chest PE W and/or Wo Contrast  Result Date: 07/16/2021 CLINICAL DATA:  Concern for pulmonary embolism. Receiving hyperbaric wound treatment related to remote pelvic radiation for anal rectal cancer. EXAM: CT ANGIOGRAPHY CHEST CT ABDOMEN AND PELVIS WITH CONTRAST TECHNIQUE: Multidetector CT imaging of the chest was performed using the standard protocol during bolus administration of intravenous contrast. Multiplanar CT image reconstructions and MIPs were obtained to evaluate the vascular anatomy.  Multidetector CT imaging of the abdomen and pelvis was performed using the standard protocol during bolus administration of intravenous contrast. CONTRAST:  68m OMNIPAQUE IOHEXOL 350 MG/ML SOLN COMPARISON:  CT 12/25/2014 FINDINGS: CTA CHEST FINDINGS Cardiovascular: No filling defects within the pulmonary arteries to suggest acute pulmonary embolism. No significant vascular findings. Normal heart size. No pericardial effusion. Mediastinum/Nodes: No axillary or supraclavicular adenopathy. No mediastinal or hilar adenopathy. No pericardial fluid. Esophagus normal. Lungs/Pleura: No suspicious pulmonary nodules.  Airways normal Musculoskeletal: No acute osseous abnormality. Review of the MIP images confirms the above findings. CT ABDOMEN and PELVIS FINDINGS Hepatobiliary: Several benign hepatic cysts in central RIGHT liver again demonstrated. Enhancing hepatic lesion. Gallbladder is collapsed. Pancreas: Pancreas is normal. No ductal dilatation. No pancreatic inflammation. Spleen: Normal spleen Adrenals/urinary tract: Adrenal glands and kidneys are normal. The bladder has uniform thickened wall to 8 mm with enhancing mucosal surface. The serosal surface is hazy. No bladder distension. Stomach/Bowel: Stomach, small bowel, appendix, and cecum are normal. The colon and rectosigmoid colon are normal. Vascular/Lymphatic: Abdominal aorta is normal caliber with atherosclerotic calcification. There is no retroperitoneal or periportal lymphadenopathy. No pelvic lymphadenopathy. Reproductive: Post hysterectomy.  Adnexa unremarkable Other: No perineum abscess identified. No free fluid or abscess in the pelvis Musculoskeletal: No aggressive osseous lesion. Review of the MIP images confirms the above findings. IMPRESSION: Chest Impression: 1. No evidence acute pulmonary embolism. 2. No acute pulmonary parenchymal findings. 3. Abdomen / Pelvis Impression: 1. Bladder wall thickening and hazy serosal surface suggests cystitis. 2. No  evidence of abscess in the pelvis. 3. No evidence of metastatic disease in the abdomen pelvis. 4.  Aortic Atherosclerosis (ICD10-I70.0). Electronically Signed   By:  Suzy Bouchard M.D.   On: 07/16/2021 18:39   CT ABDOMEN PELVIS W CONTRAST  Result Date: 07/16/2021 CLINICAL DATA:  Concern for pulmonary embolism. Receiving hyperbaric wound treatment related to remote pelvic radiation for anal rectal cancer. EXAM: CT ANGIOGRAPHY CHEST CT ABDOMEN AND PELVIS WITH CONTRAST TECHNIQUE: Multidetector CT imaging of the chest was performed using the standard protocol during bolus administration of intravenous contrast. Multiplanar CT image reconstructions and MIPs were obtained to evaluate the vascular anatomy. Multidetector CT imaging of the abdomen and pelvis was performed using the standard protocol during bolus administration of intravenous contrast. CONTRAST:  42m OMNIPAQUE IOHEXOL 350 MG/ML SOLN COMPARISON:  CT 12/25/2014 FINDINGS: CTA CHEST FINDINGS Cardiovascular: No filling defects within the pulmonary arteries to suggest acute pulmonary embolism. No significant vascular findings. Normal heart size. No pericardial effusion. Mediastinum/Nodes: No axillary or supraclavicular adenopathy. No mediastinal or hilar adenopathy. No pericardial fluid. Esophagus normal. Lungs/Pleura: No suspicious pulmonary nodules.  Airways normal Musculoskeletal: No acute osseous abnormality. Review of the MIP images confirms the above findings. CT ABDOMEN and PELVIS FINDINGS Hepatobiliary: Several benign hepatic cysts in central RIGHT liver again demonstrated. Enhancing hepatic lesion. Gallbladder is collapsed. Pancreas: Pancreas is normal. No ductal dilatation. No pancreatic inflammation. Spleen: Normal spleen Adrenals/urinary tract: Adrenal glands and kidneys are normal. The bladder has uniform thickened wall to 8 mm with enhancing mucosal surface. The serosal surface is hazy. No bladder distension. Stomach/Bowel: Stomach, small  bowel, appendix, and cecum are normal. The colon and rectosigmoid colon are normal. Vascular/Lymphatic: Abdominal aorta is normal caliber with atherosclerotic calcification. There is no retroperitoneal or periportal lymphadenopathy. No pelvic lymphadenopathy. Reproductive: Post hysterectomy.  Adnexa unremarkable Other: No perineum abscess identified. No free fluid or abscess in the pelvis Musculoskeletal: No aggressive osseous lesion. Review of the MIP images confirms the above findings. IMPRESSION: Chest Impression: 1. No evidence acute pulmonary embolism. 2. No acute pulmonary parenchymal findings. 3. Abdomen / Pelvis Impression: 1. Bladder wall thickening and hazy serosal surface suggests cystitis. 2. No evidence of abscess in the pelvis. 3. No evidence of metastatic disease in the abdomen pelvis. 4.  Aortic Atherosclerosis (ICD10-I70.0). Electronically Signed   By: SSuzy BouchardM.D.   On: 07/16/2021 18:39   CT BONE MARROW BIOPSY & ASPIRATION  Result Date: 07/21/2021 INDICATION: Anemia and thrombocytopenia EXAM: CT GUIDED BONE MARROW ASPIRATES AND BIOPSY MEDICATIONS: None. ANESTHESIA/SEDATION: Fentanyl 50 mcg IV; Versed 1 mg IV Moderate Sedation Time:  12 minutes The patient was continuously monitored during the procedure by the interventional radiology nurse under my direct supervision. COMPLICATIONS: None immediate. PROCEDURE: The procedure was explained to the patient. The risks and benefits of the procedure were discussed and the patient's questions were addressed. Informed consent was obtained from the patient. The patient was placed prone on CT table. Images of the pelvis were obtained. The right side of back was prepped and draped in sterile fashion. The skin and right posterior ilium were anesthetized with 1% lidocaine. 11 gauge bone needle was directed into the right ilium with CT guidance. Two aspirates and 1 core biopsy were obtained. Bandage placed over the puncture site. IMPRESSION: CT guided  bone marrow aspiration and core biopsy. Electronically Signed   By: FMiachel RouxM.D.   On: 07/21/2021 10:04     Assessment and plan- Patient is a 75y.o. female with newly diagnosed acute myeloid leukemia here to discuss further management  Again discussed the results of flow cytometry which was consistent with acute myeloid leukemia and 42% blasts  in her peripheral blood.  Bone marrow biopsy was done today and will likely be back in the next 1 to 2 days.  Patient continues to have rectal bleeding vaginal bleeding and nosebleed likely secondary to thrombocytopenia as well as qualitative platelet defect due to underlying AML.  She will proceed with another unit of platelets today.  Overall we decided the following modalities of treatment for AML:  Proceed with Vidaza subcutaneous injection 7 days in a row at 75 mg per metered square every 28 days.  Discussed risks and benefits of Vidaza including all but not limited to nausea, vomiting, low blood counts, risk of infections and hospitalizations.  Patient will continue to require at least twice a week visits to the cancer center for routine blood work and possible transfusion support.  Given the risk of infection she would need to be started on acyclovir and Levaquin prophylaxis.  Her baseline LDH is elevated and she is at a risk of tumor lysis syndrome which will also need to be monitored closely If she tolerated cycle 1 of Vidaza well, we will consider adding venetoclax with cycle 2.  I do not wish to add up with cycle 1 given her increased risk of tumor lysis as well as the need to obtain insurance authorizations.  Venetoclax plus Vidaza has been shown to have better outcomes as compared to [place] alone but it is associated with higher risk of cytopenias and infections.  She will have our oral pharmacy work with this. At any point of time if patient feels that she does not want to proceed with any further treatments and best supportive care/hospice is also  a reasonable alternative.  Untreated AML has a poor prognosis of likely days to weeks. Proceeding with transfusion support alone without treating underlying AML with Vidaza is usually not recommended as transfusions did not help in the long run and she may require frequent blood products at least 3-4 times a week given her pancytopenia.  Patient understands all her options and is willing to proceed with Vidaza starting tomorrow.  We will continue to monitor her blood counts at least twice a week for possible transfusions.   Visit Diagnosis 1. Goals of care, counseling/discussion   2. Acute myeloid leukemia not having achieved remission ([place])      Dr. Randa Evens, MD, MPH [facility] [phone] 07/24/2021 7:55 AM

## 2021-07-25 ENCOUNTER — Encounter: Payer: PPO | Admitting: Physician Assistant

## 2021-07-25 ENCOUNTER — Inpatient Hospital Stay: Payer: PPO

## 2021-07-26 ENCOUNTER — Encounter: Payer: Self-pay | Admitting: Oncology

## 2021-07-28 ENCOUNTER — Encounter: Payer: PPO | Admitting: Physician Assistant

## 2021-07-28 ENCOUNTER — Inpatient Hospital Stay: Payer: PPO

## 2021-07-28 ENCOUNTER — Encounter (HOSPITAL_COMMUNITY): Payer: Self-pay | Admitting: Oncology

## 2021-07-29 ENCOUNTER — Ambulatory Visit: Payer: PPO | Admitting: Oncology

## 2021-07-29 ENCOUNTER — Encounter: Payer: PPO | Admitting: Physician Assistant

## 2021-07-29 ENCOUNTER — Inpatient Hospital Stay: Payer: PPO

## 2021-07-29 ENCOUNTER — Ambulatory Visit: Payer: PPO

## 2021-07-30 ENCOUNTER — Encounter (HOSPITAL_COMMUNITY): Payer: Self-pay | Admitting: Oncology

## 2021-07-30 ENCOUNTER — Inpatient Hospital Stay: Payer: PPO

## 2021-07-30 ENCOUNTER — Encounter: Payer: PPO | Admitting: Internal Medicine

## 2021-07-30 LAB — INTELLIGEN MYELOID

## 2021-07-31 ENCOUNTER — Encounter: Payer: PPO | Admitting: Physician Assistant

## 2021-08-04 ENCOUNTER — Ambulatory Visit: Payer: PPO

## 2021-08-04 ENCOUNTER — Other Ambulatory Visit: Payer: PPO

## 2021-08-04 ENCOUNTER — Encounter: Payer: PPO | Admitting: Hospice and Palliative Medicine

## 2021-08-07 DEATH — deceased

## 2021-08-12 ENCOUNTER — Ambulatory Visit: Payer: Self-pay | Admitting: Family Medicine

## 2021-08-12 ENCOUNTER — Ambulatory Visit: Payer: PPO

## 2021-08-12 ENCOUNTER — Ambulatory Visit: Payer: PPO | Admitting: Nurse Practitioner

## 2021-08-12 ENCOUNTER — Other Ambulatory Visit: Payer: PPO

## 2021-08-13 ENCOUNTER — Ambulatory Visit: Payer: PPO

## 2021-08-18 ENCOUNTER — Ambulatory Visit: Payer: PPO

## 2021-08-18 ENCOUNTER — Ambulatory Visit: Payer: PPO | Admitting: Oncology

## 2021-08-18 ENCOUNTER — Other Ambulatory Visit: Payer: PPO

## 2021-08-19 ENCOUNTER — Ambulatory Visit: Payer: PPO

## 2021-08-20 ENCOUNTER — Ambulatory Visit: Payer: PPO

## 2021-08-21 ENCOUNTER — Ambulatory Visit: Payer: PPO

## 2021-08-22 ENCOUNTER — Ambulatory Visit: Payer: PPO

## 2021-08-25 ENCOUNTER — Ambulatory Visit: Payer: PPO

## 2021-08-26 ENCOUNTER — Ambulatory Visit: Payer: PPO

## 2021-08-27 ENCOUNTER — Ambulatory Visit: Payer: PPO

## 2021-12-18 ENCOUNTER — Encounter: Payer: PPO | Admitting: Obstetrics and Gynecology

## 2022-01-06 ENCOUNTER — Encounter (INDEPENDENT_AMBULATORY_CARE_PROVIDER_SITE_OTHER): Payer: PPO

## 2022-01-06 ENCOUNTER — Ambulatory Visit (INDEPENDENT_AMBULATORY_CARE_PROVIDER_SITE_OTHER): Payer: PPO | Admitting: Vascular Surgery

## 2022-02-12 ENCOUNTER — Encounter: Payer: PPO | Admitting: Dermatology

## 2022-04-06 IMAGING — CR DG WRIST COMPLETE 3+V*R*
1 series · 4 of 4 positions shown · non-contrast
Comparison: None.

CLINICAL DATA: Lump at base of first metacarpal with pain 2 weeks.
Possible ganglion cyst.

EXAM:
RIGHT WRIST - COMPLETE 3+ VIEW

[Series 1: dg wrist complete right · 0.14mm/px · 4 of 4 slices shown]
[im 1/4]
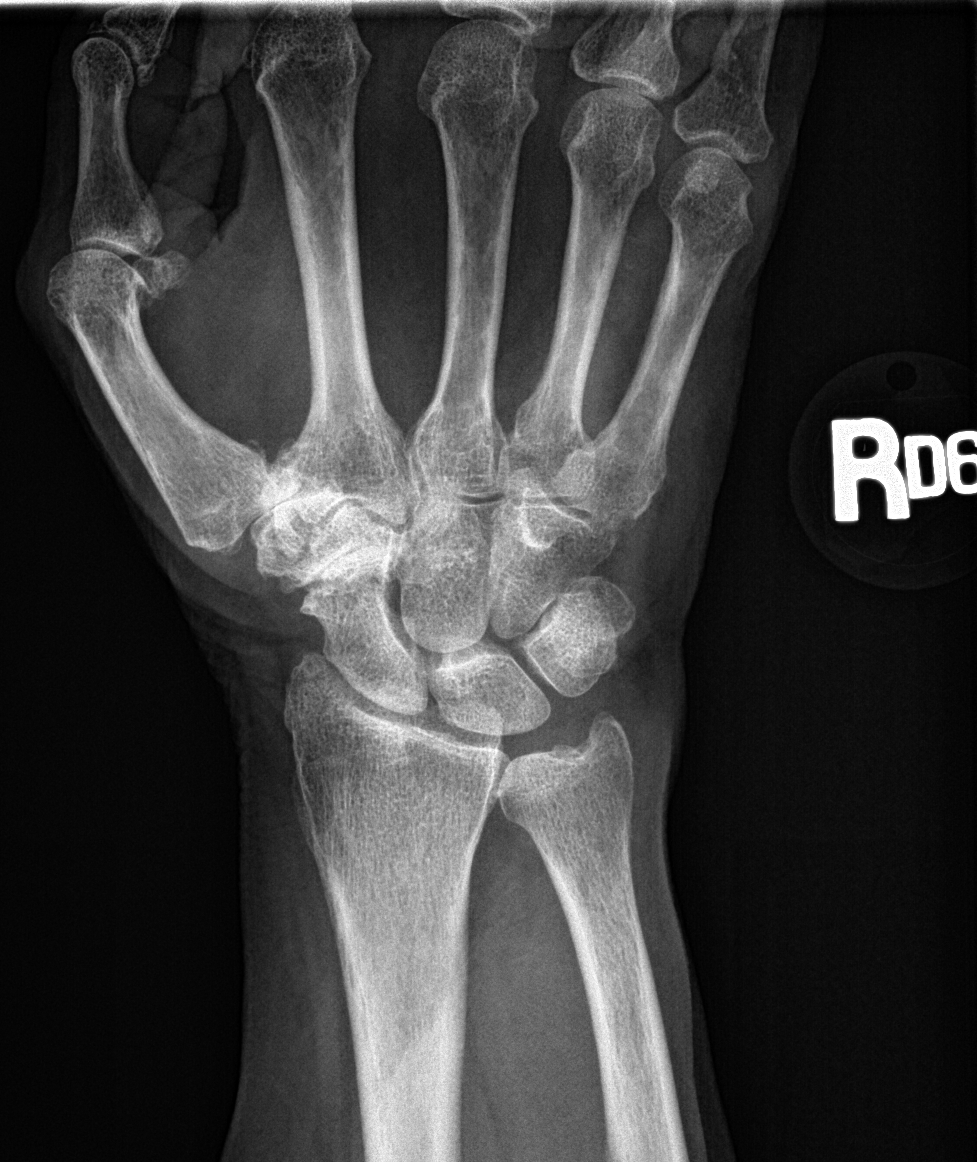
[im 2/4]
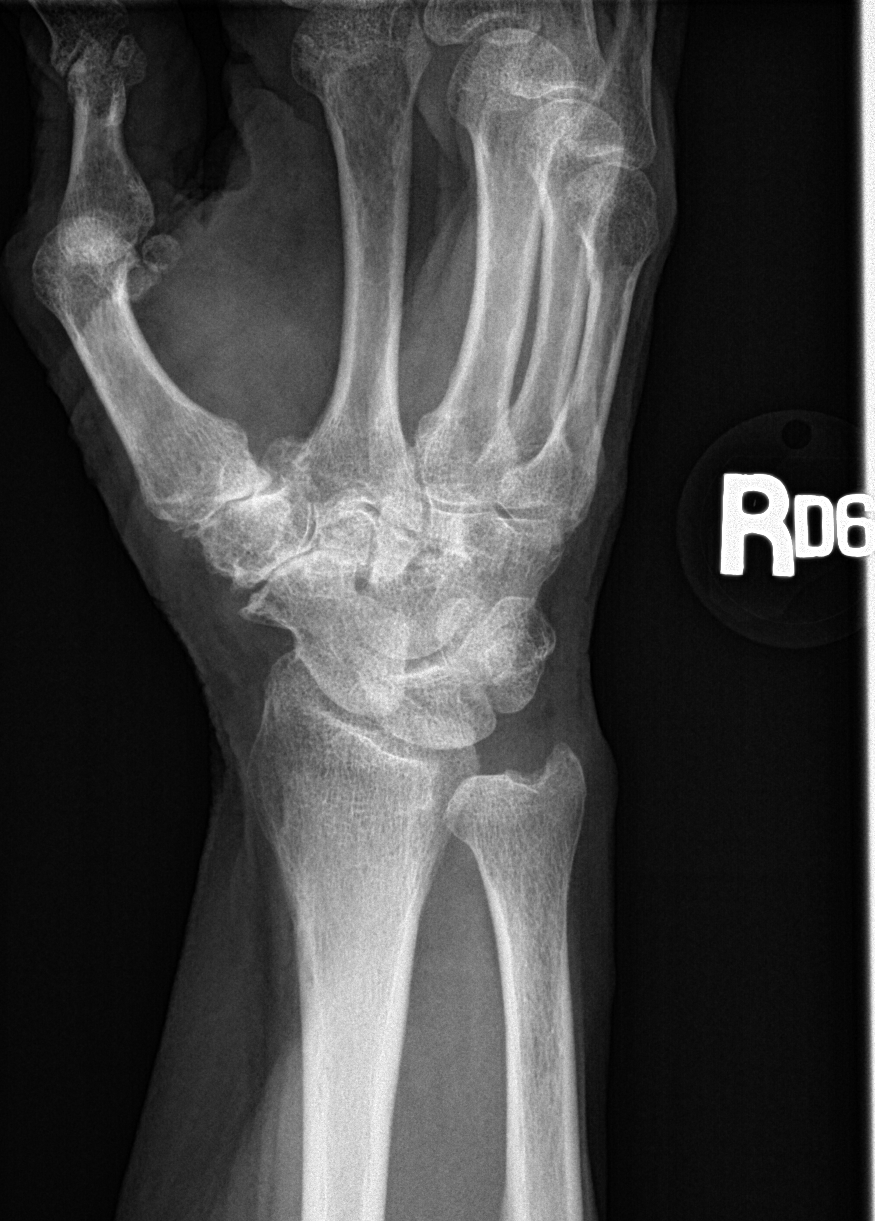
[im 3/4]
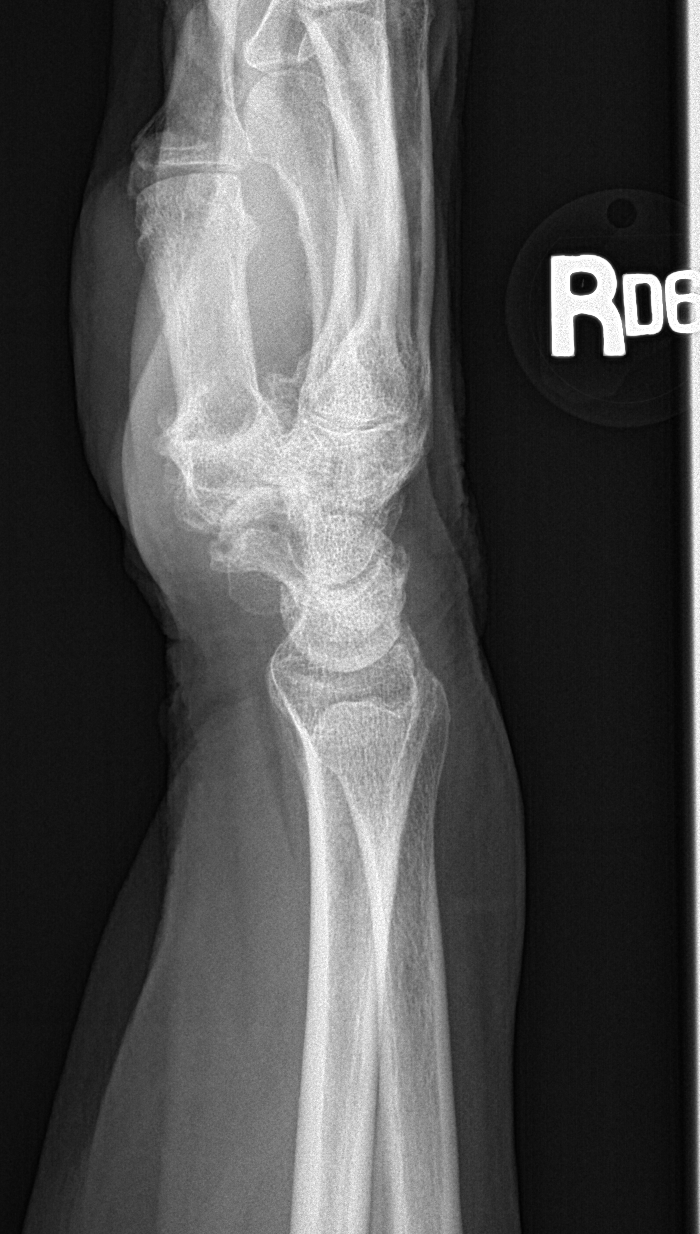
[im 4/4]
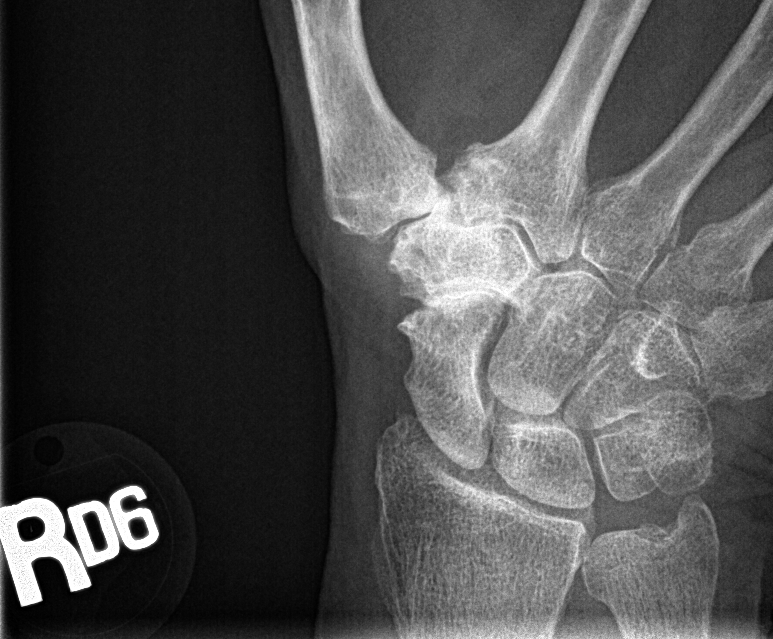

[4 of 4 positions shown; findings below may reference images not displayed]

FINDINGS: Moderate osteoarthritic changes over the scaphoid trapezium joint
and first carpometacarpal joints. Mild degenerate change of the
second carpometacarpal joint. Minimal degenerate change of the
radiocarpal joint. No evidence of acute fracture or dislocation.
IMPRESSION: 1.  No acute findings.

2. Moderate osteoarthritic changes as described worse over the
scaphoid trapezium joint and first carpometacarpal joint.

## 2022-06-30 ENCOUNTER — Ambulatory Visit (INDEPENDENT_AMBULATORY_CARE_PROVIDER_SITE_OTHER): Payer: PPO | Admitting: Vascular Surgery

## 2022-06-30 ENCOUNTER — Encounter (INDEPENDENT_AMBULATORY_CARE_PROVIDER_SITE_OTHER): Payer: PPO
